# Patient Record
Sex: Female | Born: 1965
Health system: Southern US, Community
[De-identification: ages and names within clinical notes are randomized; demographics above are authoritative.]

## PROBLEM LIST (undated history)

## (undated) DIAGNOSIS — E785 Hyperlipidemia, unspecified: Secondary | ICD-10-CM

## (undated) DIAGNOSIS — Z87898 Personal history of other specified conditions: Secondary | ICD-10-CM

## (undated) DIAGNOSIS — D649 Anemia, unspecified: Secondary | ICD-10-CM

## (undated) DIAGNOSIS — Z5189 Encounter for other specified aftercare: Secondary | ICD-10-CM

## (undated) DIAGNOSIS — K219 Gastro-esophageal reflux disease without esophagitis: Secondary | ICD-10-CM

## (undated) HISTORY — DX: Encounter for other specified aftercare: Z51.89

## (undated) HISTORY — DX: Hyperlipidemia, unspecified: E78.5

## (undated) HISTORY — PX: WISDOM TOOTH EXTRACTION: SHX21

## (undated) HISTORY — DX: Personal history of other specified conditions: Z87.898

## (undated) HISTORY — DX: Gastro-esophageal reflux disease without esophagitis: K21.9

## (undated) HISTORY — PX: TONSILLECTOMY: SUR1361

---

## 2006-02-26 DIAGNOSIS — Z87898 Personal history of other specified conditions: Secondary | ICD-10-CM

## 2006-02-26 HISTORY — DX: Personal history of other specified conditions: Z87.898

## 2006-10-17 ENCOUNTER — Ambulatory Visit: Payer: Self-pay

## 2006-10-22 ENCOUNTER — Ambulatory Visit: Payer: Self-pay

## 2007-04-16 ENCOUNTER — Ambulatory Visit: Payer: Self-pay | Admitting: General Surgery

## 2007-10-06 ENCOUNTER — Ambulatory Visit: Payer: Self-pay | Admitting: General Surgery

## 2008-04-13 ENCOUNTER — Ambulatory Visit: Payer: Self-pay | Admitting: Internal Medicine

## 2008-04-26 ENCOUNTER — Ambulatory Visit: Payer: Self-pay | Admitting: Internal Medicine

## 2008-06-18 IMAGING — US ULTRASOUND LEFT BREAST
1 series · 17 of 24 positions shown · non-contrast
Comparison: none

REASON FOR EXAM: Left breast nodular density
COMMENTS:

[Series 1: ultrasound left breast · 17 of 24 slices shown]
[im 1/24]
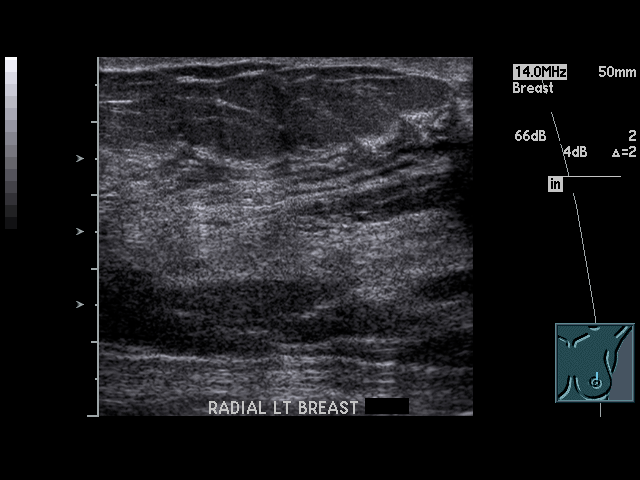
[im 3/24]
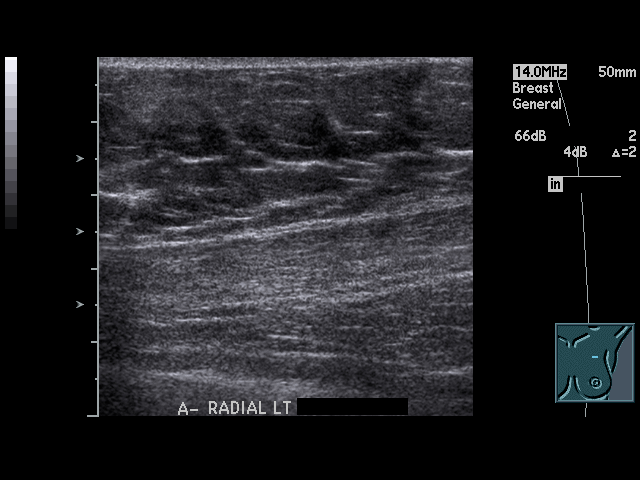
[im 4/24]
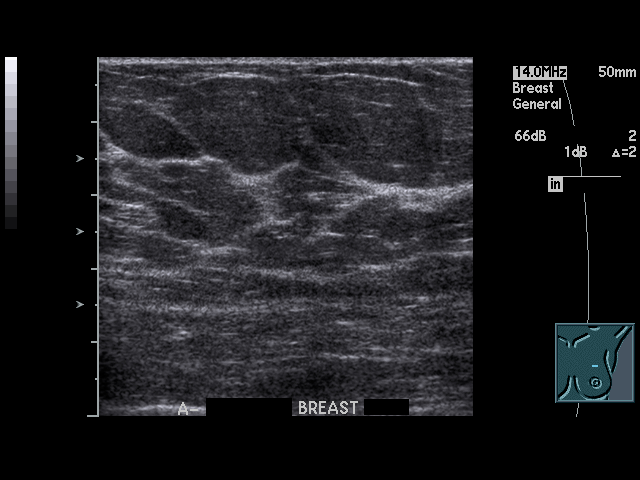
[im 5/24]
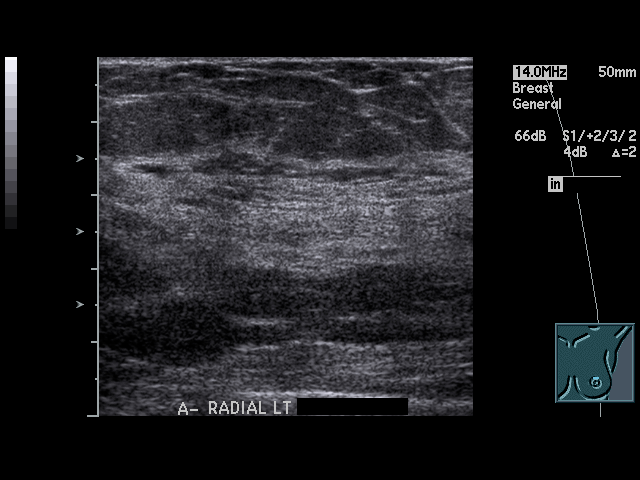
[im 7/24]
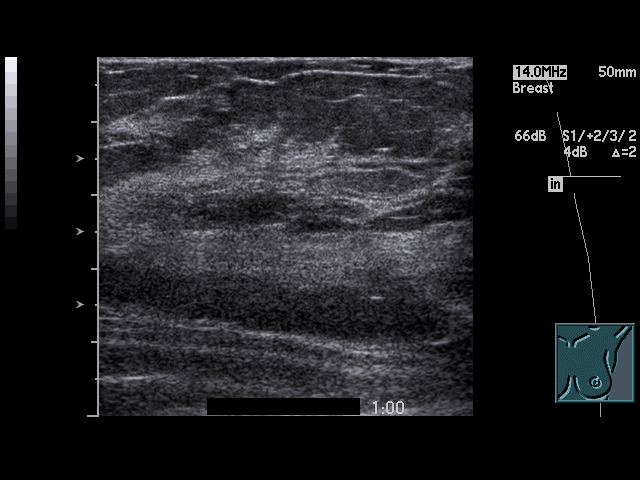
[im 8/24]
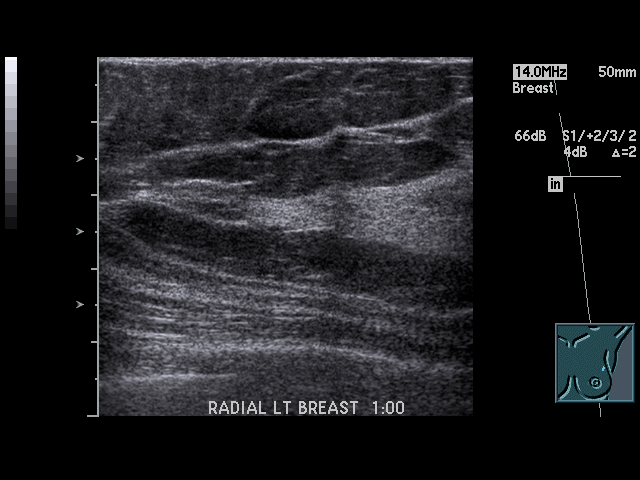
[im 10/24]
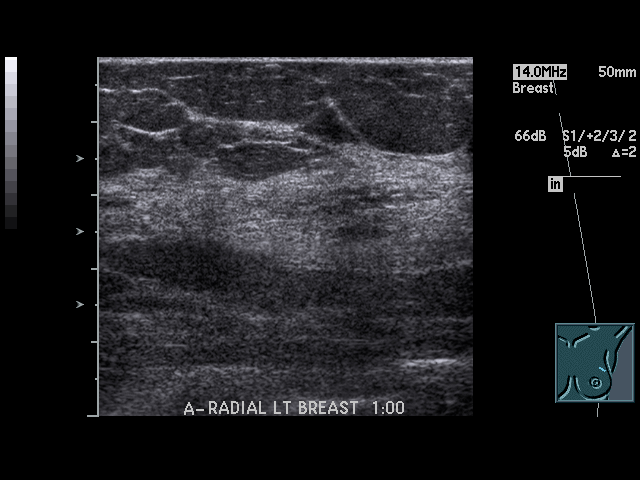
[im 11/24]
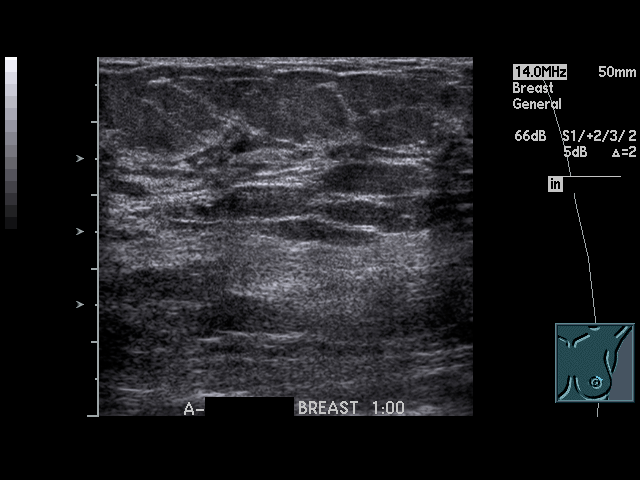
[im 13/24]
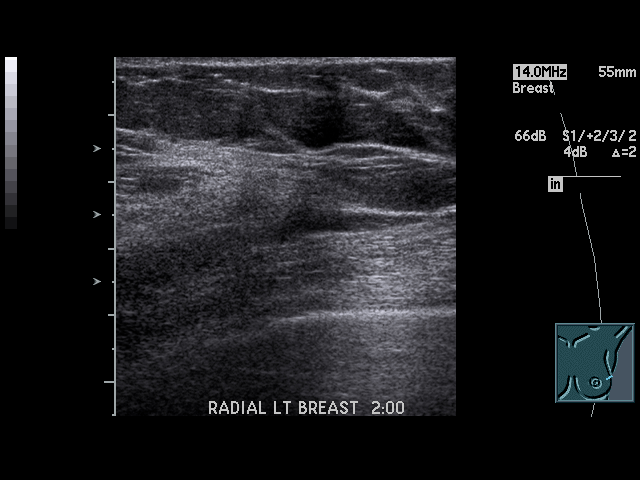
[im 14/24]
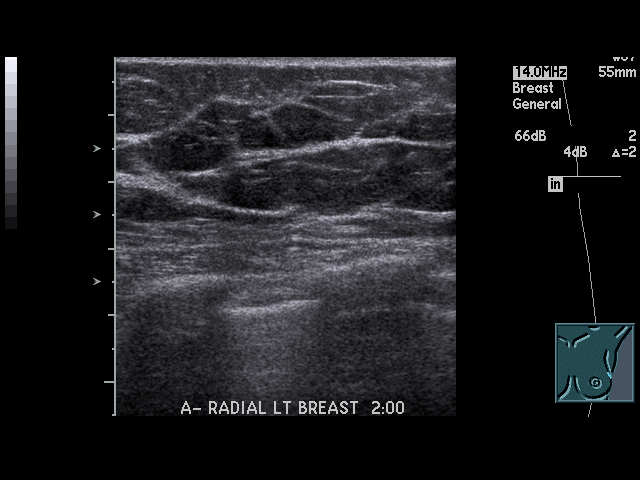
[im 15/24]
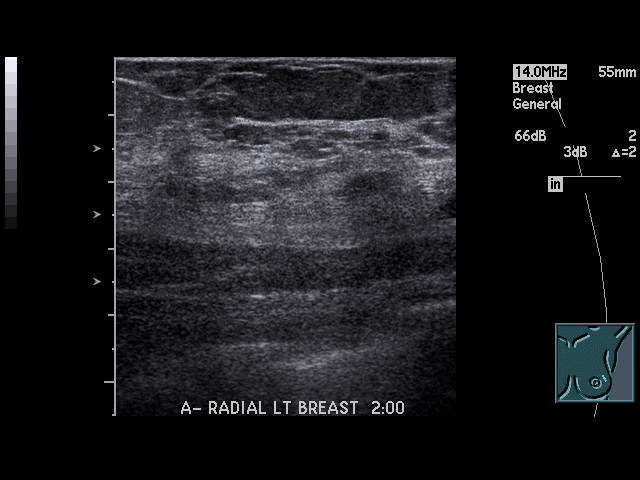
[im 17/24]
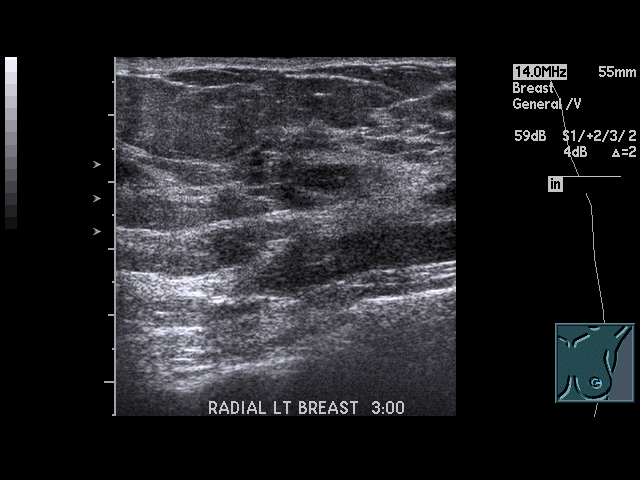
[im 18/24]
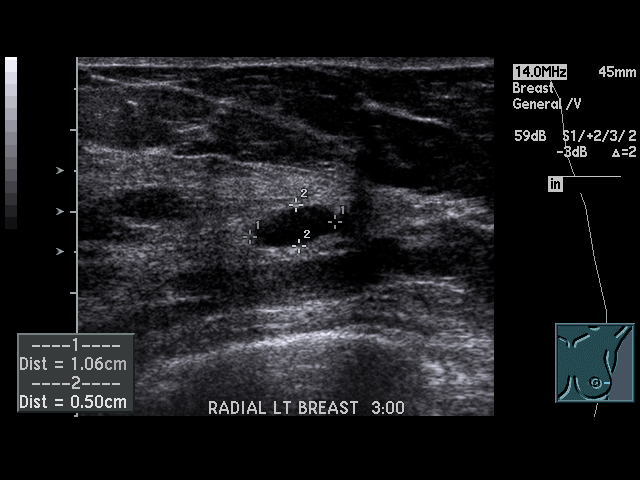
[im 20/24]
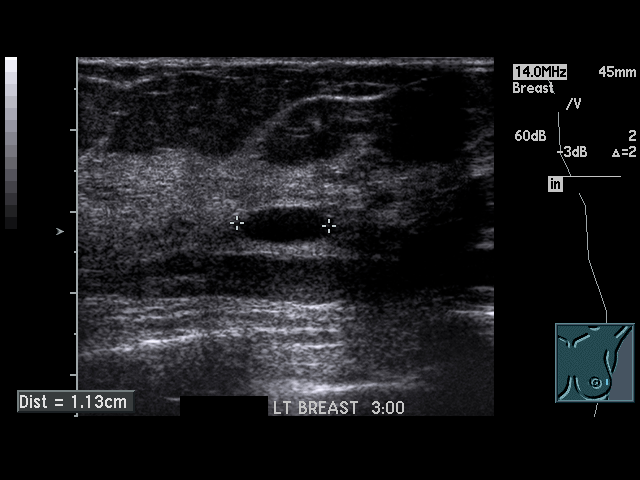
[im 21/24]
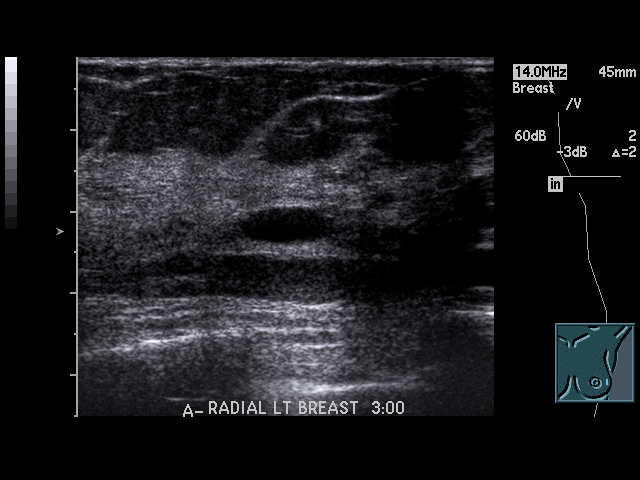
[im 22/24]
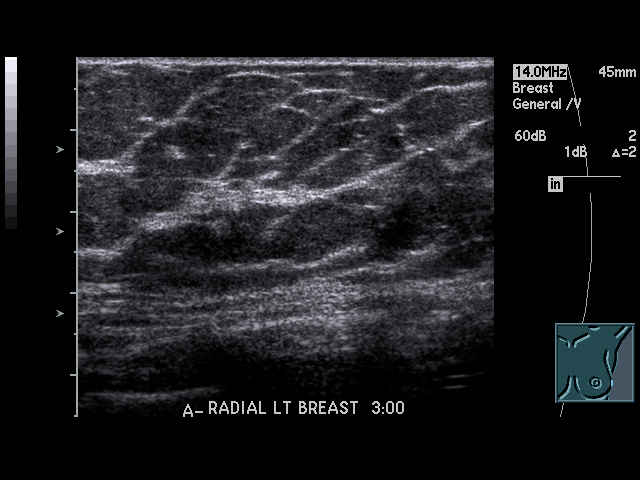
[im 24/24]
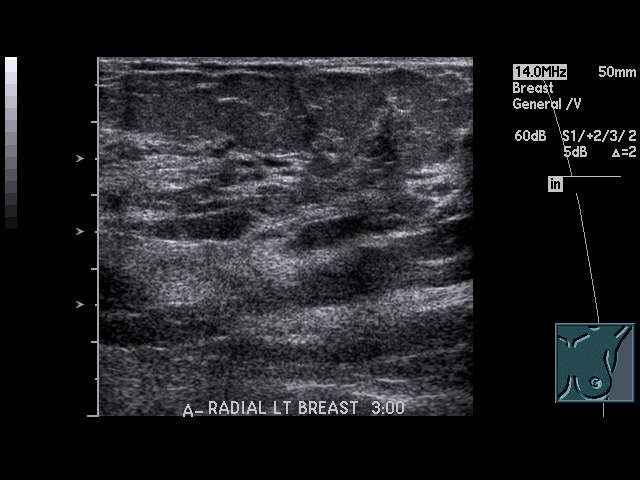

[17 of 24 positions shown; findings below may reference images not displayed]

PROCEDURE:     US  - US BREAST LEFT  - October 22, 2006  [DATE]

RESULT:     The upper outer quadrant of the LEFT breast was scanned
sonographically. The area seen in the axillary tail region of the breast in
approximately the 1 to [DATE] position was not evident sonographically. There
is a focal area of decreased echogenicity with through transmission in the 3
o'clock position which represents a probable cyst measuring 10.6 x 5.0 x
11.3 mm.
IMPRESSION: 1.     Nonvisualization of the nodular density in the upper outer quadrant
of the LEFT breast.
2.     Cyst at the approximately 3 o'clock position. Surgical consultation
is recommended for further investigation of the nodular density which is
seen only mammographically.

BI-RADS: Category 4 - Suspicious Abnormality

## 2008-11-08 ENCOUNTER — Ambulatory Visit: Payer: Self-pay

## 2009-05-11 ENCOUNTER — Ambulatory Visit: Payer: Self-pay | Admitting: Internal Medicine

## 2009-11-23 ENCOUNTER — Ambulatory Visit: Payer: Self-pay

## 2010-12-25 ENCOUNTER — Ambulatory Visit: Payer: Self-pay

## 2011-03-21 ENCOUNTER — Encounter: Payer: Self-pay | Admitting: Internal Medicine

## 2011-03-21 ENCOUNTER — Ambulatory Visit (INDEPENDENT_AMBULATORY_CARE_PROVIDER_SITE_OTHER): Payer: 59 | Admitting: Internal Medicine

## 2011-03-21 DIAGNOSIS — IMO0002 Reserved for concepts with insufficient information to code with codable children: Secondary | ICD-10-CM

## 2011-03-21 DIAGNOSIS — IMO0001 Reserved for inherently not codable concepts without codable children: Secondary | ICD-10-CM

## 2011-03-21 DIAGNOSIS — E785 Hyperlipidemia, unspecified: Secondary | ICD-10-CM

## 2011-03-21 DIAGNOSIS — E1165 Type 2 diabetes mellitus with hyperglycemia: Secondary | ICD-10-CM

## 2011-03-21 MED ORDER — METFORMIN HCL 850 MG PO TABS: 850.0000 mg | ORAL_TABLET | Freq: Two times a day (BID) | ORAL | Status: DC

## 2011-03-21 MED ORDER — GLIPIZIDE 5 MG PO TABS: 5.0000 mg | ORAL_TABLET | Freq: Two times a day (BID) | ORAL | Status: DC

## 2011-03-21 NOTE — Patient Instructions (Addendum)
EAS  Carb control [place] Vanilla  Has 2.5 carbs,  110 cal  Atkins has at least 5 or 6 flavors  Of low carb shakes   Atkins chocolate lover's variety protein bars  range from 130 to 160 cal  And   11 carbs of fiber   Glipizide 5 mg twice daily with in 20 minutes of breakfast and dinner

## 2011-03-21 NOTE — Progress Notes (Signed)
Subjective:    Patient ID: Madden Piazza, female    DOB: 1965/06/28, 46 y.o.   MRN: [id number]  HPI this Manfred[place] is a 46 year old white female with a history of hyperlipidemia and diabetes mellitus,  previously well controlled who presents today for six-month followup. She recently had an abnormal fasting lipid panel and hemoglobin A1c at the employee [place] screening at  [facility].  Her triglycerides were over 300 and hemoglobin A1c was 8.0. Her chief complaint today is uncontrolled diabetes and hypertriglyceridemia. She is taking metformin as directed and is exercising 3 times a week with both running and weight lifting. She has not followed a low carbohydrate diet but is interested in hearing in receiving information about dietary modifications. Past Medical History  Diagnosis Date  . Diabetes mellitus   . Hyperlipidemia   . GERD (gastroesophageal reflux disease)    No current outpatient prescriptions on file prior to visit.         Review of Systems  Constitutional: Negative for fever, chills and unexpected weight change.  HENT: Negative for hearing loss, ear pain, nosebleeds, congestion, sore throat, facial swelling, rhinorrhea, sneezing, mouth sores, trouble swallowing, neck pain, neck stiffness, voice change, postnasal drip, sinus pressure, tinnitus and ear discharge.   Eyes: Negative for pain, discharge, redness and visual disturbance.  Respiratory: Negative for cough, chest tightness, shortness of breath, wheezing and stridor.   Cardiovascular: Negative for chest pain, palpitations and leg swelling.  Musculoskeletal: Negative for myalgias and arthralgias.  Skin: Negative for color change and rash.  Neurological: Negative for dizziness, weakness, light-headedness and headaches.  Hematological: Negative for adenopathy.    BP 110/84  Pulse 80  Temp(Src) 98.7 F (37.1 C) (Oral)  Wt 180 lb (81.647 kg)     Objective:   Physical Exam  Constitutional: She is oriented to  person, place, and time. She appears well-developed and well-nourished.  HENT:  Mouth/Throat: Oropharynx is clear and moist.  Eyes: EOM are normal. Pupils are equal, round, and reactive to light. No scleral icterus.  Neck: Normal range of motion. Neck supple. No JVD present. No thyromegaly present.  Cardiovascular: Normal rate, regular rhythm, normal heart sounds and intact distal pulses.   Pulmonary/Chest: Effort normal and breath sounds normal.  Abdominal: Soft. Bowel sounds are normal. She exhibits no mass. There is no tenderness.  Musculoskeletal: Normal range of motion. She exhibits no edema.  Lymphadenopathy:    She has no cervical adenopathy.  Neurological: She is alert and oriented to person, place, and time.  Skin: Skin is warm and dry.  Psychiatric: She has a normal mood and affect.       Assessment & Plan:   Diabetes mellitus type 2, uncontrolled Her recent elevated hemoglobin A1c of 8.0 reflects need for additional medication. We are starting glipizide 5 mg twice daily before breakfast and dinner. We spent 15 minutes in counseling regarding dietary modifications that she can make that will both control her diabetes and help her continue to lose weight. Low carbohydrate and alternatives to some of the staple food items including protein shake for breakfast low carbohydrate breads and avoidance of all white potatoes and pasta were all suggested. She will have a repeat hemoglobin A1c in 3 months. She is up-to-date with eye exams and sees Dr. Kate Sable on an annual basis. She has no complications of neuropathy or renal insufficiency or proteinuria.  Hyperlipidemia Her triglycerides were 346 by recent screening. We decided to address this with diet and exercise and repeat in 3 months.  If her triglycerides remain above 200 that time we will consider adding fenofibrate.    Updated Medication List Outpatient Encounter Prescriptions as of 03/21/2011  Medication Sig Dispense Refill  .  metFORMIN (GLUCOPHAGE) 850 MG tablet Take 1 tablet (850 mg total) by mouth 2 (two) times daily with a meal.  90 tablet  3  . NUVARING 0.12-0.015 MG/24HR vaginal ring       . Omega-3 Fatty Acids (FISH OIL PO) Take 1500 mg's by mouth daily      . DISCONTD: metFORMIN (GLUCOPHAGE) 850 MG tablet Take 850 mg by mouth 2 (two) times daily with a meal.      . amoxicillin (AMOXIL) 875 MG tablet       . glipiZIDE (GLUCOTROL) 5 MG tablet Take 1 tablet (5 mg total) by mouth 2 (two) times daily before a meal.  60 tablet  3

## 2011-03-22 ENCOUNTER — Encounter: Payer: Self-pay | Admitting: Internal Medicine

## 2011-03-22 DIAGNOSIS — E1165 Type 2 diabetes mellitus with hyperglycemia: Secondary | ICD-10-CM | POA: Insufficient documentation

## 2011-03-22 DIAGNOSIS — E785 Hyperlipidemia, unspecified: Secondary | ICD-10-CM | POA: Insufficient documentation

## 2011-03-22 DIAGNOSIS — Z794 Long term (current) use of insulin: Secondary | ICD-10-CM | POA: Insufficient documentation

## 2011-03-22 DIAGNOSIS — R809 Proteinuria, unspecified: Secondary | ICD-10-CM | POA: Insufficient documentation

## 2011-03-22 DIAGNOSIS — E119 Type 2 diabetes mellitus without complications: Secondary | ICD-10-CM | POA: Insufficient documentation

## 2011-03-22 DIAGNOSIS — IMO0002 Reserved for concepts with insufficient information to code with codable children: Secondary | ICD-10-CM | POA: Insufficient documentation

## 2011-03-22 DIAGNOSIS — K219 Gastro-esophageal reflux disease without esophagitis: Secondary | ICD-10-CM | POA: Insufficient documentation

## 2011-03-22 NOTE — Assessment & Plan Note (Signed)
Her recent elevated hemoglobin A1c of 8.0 reflects need for additional medication. We are starting glipizide 5 mg twice daily before breakfast and dinner. We spent 15 minutes in counseling regarding dietary modifications that she can make that will both control her diabetes and help her continue to lose weight. Low carbohydrate and alternatives to some of the staple food items including protein shake for breakfast low carbohydrate breads and avoidance of all white potatoes and pasta were all suggested. She will have a repeat hemoglobin A1c in 3 months. She is up-to-date with eye exams and sees Dr. Kate Sable on an annual basis. She has no complications of neuropathy or renal insufficiency or proteinuria.

## 2011-03-22 NOTE — Assessment & Plan Note (Signed)
Her triglycerides were 346 by recent screening. We decided to address this with diet and exercise and repeat in 3 months. If her triglycerides remain above 200 that time we will consider adding fenofibrate.

## 2011-03-23 ENCOUNTER — Other Ambulatory Visit: Payer: Self-pay | Admitting: *Deleted

## 2011-03-23 MED ORDER — GLUCOSE BLOOD VI STRP: ORAL_STRIP | Status: AC

## 2011-06-22 ENCOUNTER — Other Ambulatory Visit: Payer: Self-pay | Admitting: Internal Medicine

## 2011-06-22 LAB — LIPID PANEL
Cholesterol: 171 mg/dL (ref 0–200)
HDL Cholesterol: 55 mg/dL (ref 40–60)
Ldl Cholesterol, Calc: 72 mg/dL (ref 0–100)

## 2011-06-22 LAB — HEMOGLOBIN A1C: Hemoglobin A1C: 7.2 % — ABNORMAL HIGH (ref 4.2–6.3)

## 2011-06-26 ENCOUNTER — Telehealth: Payer: Self-pay | Admitting: Internal Medicine

## 2011-06-26 NOTE — Telephone Encounter (Signed)
hgba1c is up to 7.2,  Trigs are elevated at 219.  She is due for follow up office visit.

## 2011-06-26 NOTE — Telephone Encounter (Signed)
Left message asking patient to call back

## 2011-06-28 NOTE — Telephone Encounter (Signed)
Left another message asking patient to return my call.

## 2011-07-02 NOTE — Telephone Encounter (Signed)
Left message asking patient to call back

## 2011-07-04 ENCOUNTER — Encounter: Payer: Self-pay | Admitting: Internal Medicine

## 2011-07-04 ENCOUNTER — Ambulatory Visit (INDEPENDENT_AMBULATORY_CARE_PROVIDER_SITE_OTHER): Payer: 59 | Admitting: Internal Medicine

## 2011-07-04 VITALS — BP 136/82 | HR 87 | Temp 98.6°F | Resp 16 | Wt 183.5 lb

## 2011-07-04 DIAGNOSIS — IMO0002 Reserved for concepts with insufficient information to code with codable children: Secondary | ICD-10-CM

## 2011-07-04 DIAGNOSIS — E785 Hyperlipidemia, unspecified: Secondary | ICD-10-CM

## 2011-07-04 DIAGNOSIS — E1165 Type 2 diabetes mellitus with hyperglycemia: Secondary | ICD-10-CM

## 2011-07-04 DIAGNOSIS — IMO0001 Reserved for inherently not codable concepts without codable children: Secondary | ICD-10-CM

## 2011-07-04 NOTE — Telephone Encounter (Signed)
Patient notified at her appt.

## 2011-07-04 NOTE — Progress Notes (Signed)
Patient ID: Melissa Werner, female   DOB: 03/18/1965, 46 y.o.   MRN: [id number] Patient Active Problem List  Diagnoses  . Hyperlipidemia  . GERD (gastroesophageal reflux disease)  . Diabetes mellitus type 2, uncontrolled    Subjective:  CC:   Chief Complaint  Patient presents with  . Follow-up    HPI:   Melissa Werner a 46 y.o. female who presents Follow up on diabetes mellitus type 2.  last a1c was 7.2 and trigs were elvated at 215.  Started glipizide in January  44m bid and continuing 850 bid of metformin.  She has experienced occasional lows which typically are occurring when lunch is delayed so she has dealt with that by carrying with her protein bars.  Exercising 4 days per week.      Past Medical History  Diagnosis Date  . Diabetes mellitus   . Hyperlipidemia   . GERD (gastroesophageal reflux disease)     No past surgical history on file.       The following portions of the patient's history were reviewed and updated as appropriate: Allergies, current medications, and problem list.    Review of Systems:   12 Pt  review of systems was negative except those addressed in the HPI,     History   Social History  . Marital Status: Married    Spouse Name: N/A    Number of Children: N/A  . Years of Education: N/A   Occupational History  . Not on file.   Social History Main Topics  . Smoking status: Never Smoker   . Smokeless tobacco: Never Used  . Alcohol Use: No  . Drug Use: No  . Sexually Active: Not on file   Other Topics Concern  . Not on file   Social History Narrative  . No narrative on file    Objective:  BP 136/82  Pulse 87  Temp(Src) 98.6 F (37 C) (Oral)  Resp 16  Wt 183 lb 8 oz (83.235 kg)  SpO2 98%  LMP 06/18/2011  General appearance: alert, cooperative and appears stated age Ears: normal TM's and external ear canals both ears Throat: lips, mucosa, and tongue normal; teeth and gums normal Neck: no adenopathy, no carotid bruit,  supple, symmetrical, trachea midline and thyroid not enlarged, symmetric, no tenderness/mass/nodules Back: symmetric, no curvature. ROM normal. No CVA tenderness. Lungs: clear to auscultation bilaterally Heart: regular rate and rhythm, S1, S2 normal, no murmur, click, rub or gallop Abdomen: soft, non-tender; bowel sounds normal; no masses,  no organomegaly Pulses: 2+ and symmetric Skin: Skin color, texture, turgor normal. No rashes or lesions Lymph nodes: Cervical, supraclavicular, and axillary nodes normal. Foot exam:  Nails are well trimmed,  No callouses,  Sensation intact to microfilament  Assessment and Plan:  Diabetes mellitus type 2, uncontrolled Stable control,  a1c 7.2  We discussed reducing metformin to 500 mg bid due to recurrent diarrhea interfering withher afternoon rung.  Will increase glipizide if needed ,  hgbac was 7.2 and will repeat in July.   Hyperlipidemia Last triglyceride level was 215.  We again discussed low glycemic index diet to medications.    Updated Medication List Outpatient Encounter Prescriptions as of 07/04/2011  Medication Sig Dispense Refill  . glipiZIDE (GLUCOTROL) 5 MG tablet Take 1 tablet (5 mg total) by mouth 2 (two) times daily before a meal.  60 tablet  3  . glucose blood (ONE TOUCH ULTRA TEST) test strip Use as instructed  100 each  12  .  metFORMIN (GLUCOPHAGE) 850 MG tablet Take 1 tablet (850 mg total) by mouth 2 (two) times daily with a meal.  90 tablet  3  . Multiple Vitamin (MULTIVITAMIN) tablet Take 1 tablet by mouth daily.      Marland Kitchen NUVARING 0.12-0.015 MG/24HR vaginal ring       . Omega-3 Fatty Acids (FISH OIL PO) Take 1500 mg's by mouth daily         No orders of the defined types were placed in this encounter.    No Follow-up on file.

## 2011-07-04 NOTE — Assessment & Plan Note (Addendum)
Last triglyceride level was 215.  We again discussed low glycemic index diet to medications.

## 2011-07-04 NOTE — Assessment & Plan Note (Addendum)
Stable control,  a1c 7.2  We discussed reducing metformin to 500 mg bid due to recurrent diarrhea interfering withher afternoon rung.  Will increase glipizide if needed ,  hgbac was 7.2 and will repeat in July.

## 2011-07-04 NOTE — Patient Instructions (Signed)
Consider the Low Glycemic Index Diet and 6 smaller meals daily .  This boosts your metabolism and regulates your sugars:   7 AM Low carbohydrate Protein  Shakes (EAS Carb Control  Or Atkins ,  Available everywhere,   In  cases at BJs )  2.5 carbs  (Add or substitute a toasted sandwhich thin w/ peanut butter)  10 AM: Protein bar by Atkins (snack size,  Chocolate lover's variety at  [employer]J's)    Lunch: sandwich on pita bread or flatbread (Joseph's makes a pita bread and a flat bread , available at [employer] and [employer]J's; Toufayah makes a low carb flatbread available at [employer] and [employer]) Mission makes a low carb whole wheat tortilla (carb Balance) available at [employer] most grocery stores   3 PM:  Mid day :  Another protein bar,  Or a  cheese stick, 1/4 cup of almonds, walnuts, pistachios, pecans, peanuts,  Macadamia nuts  6 PM  Dinner:  "mean and green:"  Meat/chicken/fish, salad, and green veggie : use ranch, vinagrette,  Blue cheese, etc  9 PM snack : Breyer's low carb fudgsicle or  ice cream bar (Carb Smart), or  Weight Watcher's ice cream bar , or another protein shake

## 2011-07-08 MED ORDER — METFORMIN HCL 500 MG PO TABS: ORAL_TABLET | ORAL | Status: DC

## 2011-08-09 ENCOUNTER — Other Ambulatory Visit: Payer: Self-pay | Admitting: Internal Medicine

## 2011-08-09 MED ORDER — GLIPIZIDE 5 MG PO TABS: 5.0000 mg | ORAL_TABLET | Freq: Two times a day (BID) | ORAL | Status: DC

## 2011-09-26 ENCOUNTER — Ambulatory Visit: Payer: 59 | Admitting: Internal Medicine

## 2011-10-23 ENCOUNTER — Ambulatory Visit: Payer: 59 | Admitting: Internal Medicine

## 2011-11-09 ENCOUNTER — Ambulatory Visit: Payer: Self-pay | Admitting: Internal Medicine

## 2011-11-09 LAB — LIPID PANEL
HDL Cholesterol: 49 mg/dL (ref 40–60)
Ldl Cholesterol, Calc: 68 mg/dL (ref 0–100)
VLDL Cholesterol, Calc: 54 mg/dL — ABNORMAL HIGH (ref 5–40)

## 2011-11-09 LAB — HEMOGLOBIN A1C: Hemoglobin A1C: 7.7 % — ABNORMAL HIGH (ref 4.2–6.3)

## 2011-11-13 ENCOUNTER — Encounter: Payer: Self-pay | Admitting: Internal Medicine

## 2011-11-13 ENCOUNTER — Ambulatory Visit (INDEPENDENT_AMBULATORY_CARE_PROVIDER_SITE_OTHER): Payer: PRIVATE HEALTH INSURANCE | Admitting: Internal Medicine

## 2011-11-13 VITALS — BP 126/84 | HR 76 | Temp 98.6°F | Resp 14 | Wt 182.0 lb

## 2011-11-13 DIAGNOSIS — E785 Hyperlipidemia, unspecified: Secondary | ICD-10-CM

## 2011-11-13 DIAGNOSIS — E1165 Type 2 diabetes mellitus with hyperglycemia: Secondary | ICD-10-CM

## 2011-11-13 DIAGNOSIS — IMO0001 Reserved for inherently not codable concepts without codable children: Secondary | ICD-10-CM

## 2011-11-13 DIAGNOSIS — IMO0002 Reserved for concepts with insufficient information to code with codable children: Secondary | ICD-10-CM

## 2011-11-13 MED ORDER — GLIPIZIDE 10 MG PO TABS: 10.0000 mg | ORAL_TABLET | Freq: Two times a day (BID) | ORAL | Status: DC

## 2011-11-13 MED ORDER — METFORMIN HCL 500 MG PO TABS: ORAL_TABLET | ORAL | Status: DC

## 2011-11-13 NOTE — Patient Instructions (Addendum)
Try increasing the glipizide to 10 mg twice daily or 7.5 if you have lows  This is  Dr. Norton Blizzard version of a  "Low GI"  Diet:  All of the foods can be found at grocery stores and in bulk at [employer].  The Atkins protein bars and shakes are available in more varieties at [employer], [employer] and [employer].     7 AM Breakfast:  Low carbohydrate Protein  Shakes (I recommend the EAS AdvantEdge "Carb Control" shakes  Or the low carb shakes by Atkins.   Both are available everywhere:  In  cases at BJs  Or in 4 packs at grocery stores and pharmacies  2.5 carbs  (Alternative is  a toasted Arnold's Sandwhich Thin w/ peanut butter, a "Bagel Thin" with cream cheese and salmon) or  a scrambled egg burrito made with a low carb tortilla .  Avoid cereal and bananas, oatmeal too unless you are cooking the old fashioned kind that takes 30-40 minutes to prepare.  the rest is overly processed, has minimal fiber, and is loaded with carbohydrates!   10 AM: Protein bar by Atkins (the snack size, under 200 cal).  There are many varieties , available widely again or in bulk in limited varieties at BJs)  Other so called "protein bars" tend to be loaded with carbohydrates.  Remember, in food advertising, the word "energy" is synonymous for " carbohydrate."  Lunch: sandwich of [place], (or any lunchmeat, grilled meat or canned tuna), fresh avocado, mayonnaise  and cheese on a lower carbohydrate pita bread, flatbread, or tortilla . Ok to use regular mayonnaise. The bread is the only source or carbohydrate that can be decreased (Joseph's makes a pita bread and a flat bread that are 50 cal and 4 net carbs ; Toufayan makes a low carb flatbread that's 100 cal and 9 net carbs  and  Mission makes a low carb whole wheat tortilla  That is 210 cal and 6 net carbs)  3 PM:  Mid day :  Another protein bar,  Or a  cheese stick (100 cal, 0 carbs),  Or 1 ounce of  almonds, walnuts, pistachios, pecans, peanuts,  Macadamia nuts. Or a Dannon light n  Fit greek yogurt, 80 cal 8 net carbs . Avoid "granola"; the dried cranberries and raisins are loaded with carbohydrates. Mixed nuts ok if no raisins or cranberries or dried fruit.      6 PM  Dinner:  "mean and green:"  Meat/chicken/fish or a high protein legume; , with a green salad, and a low GI  Veggie (broccoli, cauliflower, green beans, spinach, brussel sprouts. Lima beans) : Avoid "Low fat dressings, as well as Reyne Dumas and [address]! They are loaded with sugar! Instead use ranch, vinagrette,  Blue cheese, etc  9 PM snack : Breyer's "low carb" fudgsicle or  ice cream bar (Carb Smart line), or  Weight Watcher's ice cream bar , or another "no sugar added" ice cream;a serving of fresh berries/cherries with whipped cream (Avoid bananas, pineapple, grapes  and watermelon on a regular basis because they are high in sugar)   Remember that snack Substitutions should be less than 15 to 20 carbs  Per serving. Remember to subtract fiber grams and sugar alcohols to get the "net carbs."

## 2011-11-14 ENCOUNTER — Encounter: Payer: Self-pay | Admitting: Internal Medicine

## 2011-11-14 NOTE — Assessment & Plan Note (Signed)
Primarily 2 triglyceride elevation. Low glycemic index diet again recommended. Her triglycerides are not high enough to warrant use of TriCor.

## 2011-11-14 NOTE — Progress Notes (Signed)
Patient ID: Melissa Werner, female   DOB: 1965-05-31, 46 y.o.   MRN: [id number]  Patient Active Problem List  Diagnosis  . Hyperlipidemia  . GERD (gastroesophageal reflux disease)  . Diabetes mellitus type 2, uncontrolled    Subjective:  CC:   Chief Complaint  Patient presents with  . Diabetes    3 month    HPI:   Melissa Werner a 46 y.o. female who presents Followup on diabetes mellitus and hyperlipidemia. She was last seen in January. She has been exercising regularly 5 days a week. She has had some dietary indiscretions over the summer. 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 No new issues. She is frustrated by her inability to lose weight and to get her diabetes at goal. She does eat cereal for breakfast. She found that the protein shakes that were recommended for breakfast by me caused significant GI intolerance with indigestion and bloating.   Past Medical History  Diagnosis Date  . Diabetes mellitus   . Hyperlipidemia   . GERD (gastroesophageal reflux disease)     History reviewed. No pertinent past surgical history.       The following portions of the patient's history were reviewed and updated as appropriate: Allergies, current medications, and problem list.    Review of Systems:   12 Pt  review of systems was negative except those addressed in the HPI,     History   Social History  . Marital Status: Married    Spouse Name: N/A    Number of Children: N/A  . Years of Education: N/A   Occupational History  . Not on file.   Social History Main Topics  . Smoking status: Never Smoker   . Smokeless tobacco: Never Used  . Alcohol Use: No  . Drug Use: No  . Sexually Active: Not on file   Other Topics Concern  . Not on file   Social History Narrative  . No narrative on file    Objective:  BP 126/84  Pulse 76  Temp 98.6 F (37 C) (Oral)  Resp 14  Wt 182 lb (82.555 kg)  SpO2 97%  LMP 10/30/2011  General  appearance: alert, cooperative and appears stated age  Neck: no adenopathy, no carotid bruit, supple, symmetrical, trachea midline and thyroid not enlarged, symmetric, no tenderness/mass/nodules Back: symmetric, no curvature. ROM normal. No CVA tenderness. Lungs: clear to auscultation bilaterally Heart: regular rate and rhythm, S1, S2 normal, no murmur, click, rub or gallop Abdomen: soft, non-tender; bowel sounds normal; no masses,  no organomegaly Pulses: 2+ and symmetric Skin: Skin color, texture, turgor normal. No rashes or lesions Lymph nodes: Cervical, supraclavicular, and axillary nodes normal.  Assessment and Plan:  Diabetes mellitus type 2, uncontrolled Her hemoglobin A1c remains elevated at 7.7 on current doses of metformin and glipizide. We discussed increasing her glipizide to 10 mg twice daily. She can reduce this to 7.5 as she is hypoglycemic events. Low glycemic index diet again given to patient with recommendations to look more critically at her diet to see where her starches can be reduced. I will have her return in 3 months with repeat labs.  Hyperlipidemia Primarily 2 triglyceride elevation. Low glycemic index diet again recommended. Her triglycerides are not high enough to warrant use of TriCor.   Updated Medication List Outpatient Encounter Prescriptions as of 11/13/2011  Medication Sig Dispense Refill  . glipiZIDE (GLUCOTROL) 10 MG tablet Take 1 tablet (10 mg total) by mouth  2 (two) times daily before a meal.  90 tablet  3  . glucose blood (ONE TOUCH ULTRA TEST) test strip Use as instructed  100 each  12  . metFORMIN (GLUCOPHAGE) 500 MG tablet One tablet twice daily.  180 tablet  3  . Multiple Vitamin (MULTIVITAMIN) tablet Take 1 tablet by mouth daily.      Marland Kitchen NUVARING 0.12-0.015 MG/24HR vaginal ring       . Omega-3 Fatty Acids (FISH OIL PO) Take 1500 mg's by mouth daily      . DISCONTD: glipiZIDE (GLUCOTROL) 5 MG tablet Take 1 tablet (5 mg total) by mouth 2 (two)  times daily before a meal.  60 tablet  3  . DISCONTD: metFORMIN (GLUCOPHAGE) 500 MG tablet One tablet twice daily.  180 tablet  3     No orders of the defined types were placed in this encounter.    No Follow-up on file.

## 2011-11-14 NOTE — Assessment & Plan Note (Addendum)
Her hemoglobin A1c remains elevated at 7.7 on current doses of metformin and glipizide. We discussed increasing her glipizide to 10 mg twice daily. She can reduce this to 7.5 as she is hypoglycemic events. Low glycemic index diet again given to patient with recommendations to look more critically at her diet to see where her starches can be reduced. I will have her return in 3 months with repeat labs.

## 2011-12-05 ENCOUNTER — Encounter: Payer: Self-pay | Admitting: Internal Medicine

## 2012-02-07 ENCOUNTER — Ambulatory Visit: Payer: Self-pay

## 2012-02-13 ENCOUNTER — Ambulatory Visit: Payer: PRIVATE HEALTH INSURANCE | Admitting: Internal Medicine

## 2012-03-14 ENCOUNTER — Ambulatory Visit: Payer: Self-pay | Admitting: Internal Medicine

## 2012-03-14 LAB — COMPREHENSIVE METABOLIC PANEL
Albumin: 3.3 g/dL — ABNORMAL LOW (ref 3.4–5.0)
Alkaline Phosphatase: 61 U/L (ref 50–136)
Anion Gap: 8 (ref 7–16)
Calcium, Total: 8.6 mg/dL (ref 8.5–10.1)
Co2: 25 mmol/L (ref 21–32)
Creatinine: 0.84 mg/dL (ref 0.60–1.30)
EGFR (African American): >60
EGFR (Non-African Amer.): >60
Glucose: 176 mg/dL — ABNORMAL HIGH (ref 65–99)
Osmolality: 282 (ref 275–301)
Potassium: 4.6 mmol/L (ref 3.5–5.1)
SGPT (ALT): 14 U/L (ref 12–78)

## 2012-03-14 LAB — LIPID PANEL
Cholesterol: 136 mg/dL (ref 0–200)
HDL Cholesterol: 53 mg/dL (ref 40–60)
Ldl Cholesterol, Calc: 57 mg/dL (ref 0–100)
Triglycerides: 132 mg/dL (ref 0–200)

## 2012-03-14 LAB — HEMOGLOBIN A1C: Hemoglobin A1C: 8.1 % — ABNORMAL HIGH (ref 4.2–6.3)

## 2012-03-17 ENCOUNTER — Telehealth: Payer: Self-pay | Admitting: Internal Medicine

## 2012-03-17 NOTE — Telephone Encounter (Signed)
Pt has an appt with you tomorrow

## 2012-03-17 NOTE — Telephone Encounter (Signed)
Cholesterol is fine bu DM is not well controlled. Please make appt. hgba1c was 8.1

## 2012-03-18 ENCOUNTER — Encounter: Payer: Self-pay | Admitting: Internal Medicine

## 2012-03-18 ENCOUNTER — Ambulatory Visit (INDEPENDENT_AMBULATORY_CARE_PROVIDER_SITE_OTHER): Payer: 59 | Admitting: Internal Medicine

## 2012-03-18 VITALS — BP 134/84 | HR 78 | Temp 98.5°F | Resp 12 | Ht 64.0 in | Wt 185.0 lb

## 2012-03-18 DIAGNOSIS — E785 Hyperlipidemia, unspecified: Secondary | ICD-10-CM

## 2012-03-18 DIAGNOSIS — E1165 Type 2 diabetes mellitus with hyperglycemia: Secondary | ICD-10-CM

## 2012-03-18 DIAGNOSIS — IMO0002 Reserved for concepts with insufficient information to code with codable children: Secondary | ICD-10-CM

## 2012-03-18 DIAGNOSIS — IMO0001 Reserved for inherently not codable concepts without codable children: Secondary | ICD-10-CM

## 2012-03-18 NOTE — Patient Instructions (Addendum)
Try substituting eggs and breakfast meat for cereal;  Or bagel thins or sandwhich thins for toast  Try the Atkins shakes before you give up on protein shakes completely    Try Dreamfield's pasta ; it is 5 carbs/serving and tastes like real pasta   Try Wasa (norweigian ) crackers , they are lowest in  Carb.  Look for the fried peas at [facility] in the fruit section

## 2012-03-18 NOTE — Progress Notes (Signed)
Patient ID: Melissa Werner, female   DOB: 08-22-1965, 47 y.o.   MRN: [id number]   Patient Active Problem List  Diagnosis  . Hyperlipidemia  . GERD (gastroesophageal reflux disease)  . Diabetes mellitus type 2, uncontrolled    Subjective:  CC:   Chief Complaint  Patient presents with  . Diabetes    3 month F/U    HPI:   Melissa Werner a 47 y.o. female who presents Follow up on diabetes, now uncontrolled by recent labs,, hgba1c 8.0  . Last seen in Sept at which time hga1c was 7.7 Patient reports noncompliance with diet due to dissatisfaction with many of the low carb choices suggested at last visit.  She is exercising regularly without chest pain or joint pain and taking her medications. No polyuria, polydipsia or skin infections.    Past Medical History  Diagnosis Date  . Diabetes mellitus   . Hyperlipidemia   . GERD (gastroesophageal reflux disease)     History reviewed. No pertinent past surgical history.       The following portions of the patient's history were reviewed and updated as appropriate: Allergies, current medications, and problem list.    Review of Systems:  Patient denies headache, fevers, malaise, unintentional weight loss, skin rash, eye pain, sinus congestion and sinus pain, sore throat, dysphagia,  hemoptysis , cough, dyspnea, wheezing, chest pain, palpitations, orthopnea, edema, abdominal pain, nausea, melena, diarrhea, constipation, flank pain, dysuria, hematuria, urinary  Frequency, nocturia, numbness, tingling, seizures,  Focal weakness, Loss of consciousness,  Tremor, insomnia, depression, anxiety, and suicidal ideation.     History   Social History  . Marital Status: Married    Spouse Name: N/A    Number of Children: N/A  . Years of Education: N/A   Occupational History  . Not on file.   Social History Main Topics  . Smoking status: Never Smoker   . Smokeless tobacco: Never Used  . Alcohol Use: No  . Drug Use: No  . Sexually  Active: Not on file   Other Topics Concern  . Not on file   Social History Narrative  . No narrative on file    Objective:  BP 134/84  Pulse 78  Temp 98.5 F (36.9 C) (Oral)  Resp 12  Ht 5\' 4"  (1.626 m)  Wt 185 lb (83.915 kg)  BMI 31.76 kg/m2  SpO2 98%  General appearance: alert, cooperative and appears stated age Ears: normal TM's and external ear canals both ears Throat: lips, mucosa, and tongue normal; teeth and gums normal Neck: no adenopathy, no carotid bruit, supple, symmetrical, trachea midline and thyroid not enlarged, symmetric, no tenderness/mass/nodules Back: symmetric, no curvature. ROM normal. No CVA tenderness. Lungs: clear to auscultation bilaterally Heart: regular rate and rhythm, S1, S2 normal, no murmur, click, rub or gallop Abdomen: soft, non-tender; bowel sounds normal; no masses,  no organomegaly Pulses: 2+ and symmetric Skin: Skin color, texture, turgor normal. No rashes or lesions Lymph nodes: Cervical, supraclavicular, and axillary nodes normal.  Assessment and Plan:  Diabetes mellitus type 2, uncontrolled She is resistant to adding more medication and is determined to lower her a1c with more dietary discretion.  Increase  glipizide to maximal dose and metformin at current dose.   Diet reviewed in detail and suggestions made.  Return in 3 months., will add onglyza if not at goal at that time  Hyperlipidemia She has lowered her cholesterol including triglycerides with diet and exercise. No meds needed   Updated Medication List Outpatient Encounter  Prescriptions as of 03/18/2012  Medication Sig Dispense Refill  . glipiZIDE (GLUCOTROL) 10 MG tablet Take 1 tablet (10 mg total) by mouth 2 (two) times daily before a meal.  90 tablet  3  . glucose blood (ONE TOUCH ULTRA TEST) test strip Use as instructed  100 each  12  . metFORMIN (GLUCOPHAGE) 500 MG tablet One tablet twice daily.  180 tablet  3  . Multiple Vitamin (MULTIVITAMIN) tablet Take 1 tablet by  mouth daily.      Marland Kitchen NUVARING 0.12-0.015 MG/24HR vaginal ring       . Omega-3 Fatty Acids (FISH OIL PO) Take 1500 mg's by mouth daily         No orders of the defined types were placed in this encounter.    Return in about 3 months (around 06/16/2012).

## 2012-03-19 LAB — LIPID PANEL
Cholesterol: 136 mg/dL (ref 0–200)
HDL: 53 mg/dL (ref 35–70)
LDL Cholesterol: 57 mg/dL
Triglycerides: 132 mg/dL (ref 40–160)

## 2012-03-20 ENCOUNTER — Encounter: Payer: Self-pay | Admitting: Internal Medicine

## 2012-03-20 NOTE — Assessment & Plan Note (Addendum)
She is resistant to adding more medication and is determined to lower her a1c with more dietary discretion.  Increase  glipizide to maximal dose and metformin at current dose.   Diet reviewed in detail and suggestions made.  Return in 3 months., will add onglyza if not at goal at that time

## 2012-03-20 NOTE — Assessment & Plan Note (Signed)
She has lowered her cholesterol including triglycerides with diet and exercise. No meds needed

## 2012-04-13 ENCOUNTER — Other Ambulatory Visit: Payer: Self-pay

## 2012-06-11 ENCOUNTER — Telehealth: Payer: Self-pay

## 2012-06-11 NOTE — Telephone Encounter (Signed)
She needs a signed [employer],  Can pick up from front office.  Given to [employer]

## 2012-06-11 NOTE — Telephone Encounter (Signed)
Left message per DPR on mobile

## 2012-06-11 NOTE — Telephone Encounter (Signed)
Patient was left message per DPR to pick up lab paper work for [facility] at front desk.

## 2012-06-11 NOTE — Telephone Encounter (Signed)
Called patient and left voicemail asking her to give the office a call back about some labs she wanted before he next appointment with Dr. Darrick Huntsman

## 2012-06-11 NOTE — Telephone Encounter (Signed)
Patient called back she has appointment 4/29 and she states that she usually comes in a day or two before her office visit with for labs. Pt stated that you Dr. Darrick Huntsman  usually give her papers with the lab orders on them.

## 2012-06-20 ENCOUNTER — Other Ambulatory Visit: Payer: Self-pay | Admitting: Internal Medicine

## 2012-06-20 ENCOUNTER — Telehealth: Payer: Self-pay | Admitting: Internal Medicine

## 2012-06-20 DIAGNOSIS — IMO0002 Reserved for concepts with insufficient information to code with codable children: Secondary | ICD-10-CM

## 2012-06-20 DIAGNOSIS — E1165 Type 2 diabetes mellitus with hyperglycemia: Secondary | ICD-10-CM

## 2012-06-20 LAB — COMPREHENSIVE METABOLIC PANEL
BUN: 10 mg/dL (ref 7–18)
Bilirubin,Total: 0.3 mg/dL (ref 0.2–1.0)
Chloride: 104 mmol/L (ref 98–107)
Creatinine: 0.78 mg/dL (ref 0.60–1.30)
EGFR (Non-African Amer.): >60
Osmolality: 276 (ref 275–301)
SGOT(AST): 18 U/L (ref 15–37)
SGPT (ALT): 22 U/L (ref 12–78)
Total Protein: 6.9 g/dL (ref 6.4–8.2)

## 2012-06-20 LAB — HEMOGLOBIN A1C: Hemoglobin A1C: 8.7 % — ABNORMAL HIGH (ref 4.2–6.3)

## 2012-06-20 NOTE — Telephone Encounter (Signed)
Her diabetes control is worsening. Her hemoglobin A1c is now 8.7. Liver function is normal. She has an appointment next week and we will discuss additional therapy.

## 2012-06-20 NOTE — Telephone Encounter (Signed)
Patient notified as instructed detail message left on mobile voicemail per DPR.

## 2012-06-24 ENCOUNTER — Ambulatory Visit (INDEPENDENT_AMBULATORY_CARE_PROVIDER_SITE_OTHER): Payer: 59 | Admitting: Internal Medicine

## 2012-06-24 ENCOUNTER — Encounter: Payer: Self-pay | Admitting: Internal Medicine

## 2012-06-24 VITALS — BP 128/70 | HR 81 | Temp 98.4°F | Resp 16 | Wt 183.8 lb

## 2012-06-24 DIAGNOSIS — E785 Hyperlipidemia, unspecified: Secondary | ICD-10-CM

## 2012-06-24 DIAGNOSIS — E1165 Type 2 diabetes mellitus with hyperglycemia: Secondary | ICD-10-CM

## 2012-06-24 DIAGNOSIS — IMO0002 Reserved for concepts with insufficient information to code with codable children: Secondary | ICD-10-CM

## 2012-06-24 DIAGNOSIS — IMO0001 Reserved for inherently not codable concepts without codable children: Secondary | ICD-10-CM

## 2012-06-24 MED ORDER — GLUCOSE BLOOD VI STRP: ORAL_STRIP | Status: DC

## 2012-06-24 MED ORDER — METFORMIN HCL 850 MG PO TABS: ORAL_TABLET | ORAL | Status: DC

## 2012-06-24 NOTE — Patient Instructions (Addendum)
Starting tradjenta,  Suspend glipizide  For two weeks.  Add back for CBG  > 200.    Increase metformin to 850 bid  Check with insurnace about onglyza vs januvia or tradjenta   repeat labs in 3 months at my office  ]

## 2012-06-24 NOTE — Progress Notes (Signed)
Patient ID: Melissa Werner, female   DOB: 05/31/1965, 47 y.o.   MRN: [id number]  Patient Active Problem List   Diagnosis Date Noted  . Diabetes mellitus type 2, uncontrolled 03/22/2011  . Hyperlipidemia   . GERD (gastroesophageal reflux disease)     Subjective:  CC:   Chief Complaint  Patient presents with  . Follow-up    3 month    HPI:   Melissa Werner a 47 y.o. female who presents for 3 month follow up on type 2 DM and hyperlipidemia. She states that her morning fast blood sugars have been elevated ,  From 140 to 200. Post prandials have been averaging 180. She reports compliane with metformin and glipizide and no hypoglycemic events, including early morning checks.  Her recent A1c was  8.7.  She has had difficulty maintaining a low glycemic index diet, and reports that her blood sugars are actually improved after starchy meals.   Past Medical History  Diagnosis Date  . Diabetes mellitus   . Hyperlipidemia   . GERD (gastroesophageal reflux disease)     No past surgical history on file.     The following portions of the patient's history were reviewed and updated as appropriate: Allergies, current medications, and problem list.    Review of Systems:  Patient denies headache, fevers, malaise, unintentional weight loss, skin rash, eye pain, sinus congestion and sinus pain, sore throat, dysphagia,  hemoptysis , cough, dyspnea, wheezing, chest pain, palpitations, orthopnea, edema, abdominal pain, nausea, melena, diarrhea, constipation, flank pain, dysuria, hematuria, urinary  Frequency, nocturia, numbness, tingling, seizures,  Focal weakness, Loss of consciousness,  Tremor, insomnia, depression, anxiety, and suicidal ideation.     History   Social History  . Marital Status: Married    Spouse Name: N/A    Number of Children: N/A  . Years of Education: N/A   Occupational History  . Not on file.   Social History Main Topics  . Smoking status: Never Smoker   .  Smokeless tobacco: Never Used  . Alcohol Use: No  . Drug Use: No  . Sexually Active: Not on file   Other Topics Concern  . Not on file   Social History Narrative  . No narrative on file    Objective:  BP 128/70  Pulse 81  Temp(Src) 98.4 F (36.9 C) (Oral)  Resp 16  Wt 183 lb 12 oz (83.348 kg)  BMI 31.52 kg/m2  SpO2 100%  LMP 06/09/2012  General appearance: alert, cooperative and appears stated age Ears: normal TM's and external ear canals both ears Throat: lips, mucosa, and tongue normal; teeth and gums normal Neck: no adenopathy, no carotid bruit, supple, symmetrical, trachea midline and thyroid not enlarged, symmetric, no tenderness/mass/nodules Back: symmetric, no curvature. ROM normal. No CVA tenderness. Lungs: clear to auscultation bilaterally Heart: regular rate and rhythm, S1, S2 normal, no murmur, click, rub or gallop Abdomen: soft, non-tender; bowel sounds normal; no masses,  no organomegaly Pulses: 2+ and symmetric Skin: Skin color, texture, turgor normal. No rashes or lesions Lymph nodes: Cervical, supraclavicular, and axillary nodes normal.  Assessment and Plan:  Diabetes mellitus type 2, uncontrolled Continued deterioration of control.  Adding tradjenta 5 mg daily and increasing metformin to 850 mg bid.    Hyperlipidemia She has lowered her cholesterol including triglycerides with diet and exercise. No meds needed     Updated Medication List Outpatient Encounter Prescriptions as of 06/24/2012  Medication Sig Dispense Refill  . glipiZIDE (GLUCOTROL) 10 MG tablet Take 1  tablet (10 mg total) by mouth 2 (two) times daily before a meal.  90 tablet  3  . metFORMIN (GLUCOPHAGE) 850 MG tablet One tablet twice daily.  180 tablet  3  . Multiple Vitamin (MULTIVITAMIN) tablet Take 1 tablet by mouth daily.      Marland Kitchen NUVARING 0.12-0.015 MG/24HR vaginal ring       . Omega-3 Fatty Acids (FISH OIL PO) Take 1500 mg's by mouth daily      . [DISCONTINUED] metFORMIN  (GLUCOPHAGE) 500 MG tablet One tablet twice daily.  180 tablet  3  . glucose blood test strip Use three times daily to check cbg  100 each  12   No facility-administered encounter medications on file as of 06/24/2012.

## 2012-06-25 NOTE — Assessment & Plan Note (Signed)
Continued deterioration of control.  Adding tradjenta 5 mg daily and increasing metformin to 850 mg bid.

## 2012-06-25 NOTE — Assessment & Plan Note (Signed)
She has lowered her cholesterol including triglycerides with diet and exercise. No meds needed

## 2012-06-26 ENCOUNTER — Telehealth: Payer: Self-pay | Admitting: Internal Medicine

## 2012-06-26 DIAGNOSIS — Z79899 Other long term (current) drug therapy: Secondary | ICD-10-CM

## 2012-06-26 MED ORDER — LISINOPRIL 5 MG PO TABS: 5.0000 mg | ORAL_TABLET | Freq: Every day | ORAL | Status: DC

## 2012-06-26 NOTE — Telephone Encounter (Signed)
Her urine test for  Microscopic protein was slightly abnormal/positive, which means that the diabetes is starting to affect her kidney function .  I would like her to start a low dose of lisinopril for protection of her kidneys and will send rx to her pharmacy.  She will need to have a BMET 1 week after starting medication to monitor for any changes in renal function or potassium level. She can have that done here,  She does NOT need to fast for the blood test.

## 2012-06-26 NOTE — Telephone Encounter (Signed)
Informed patient of her lab results and faxed prescription to the pharmacy. She verbalized understanding and lab appointment has been scheduled.

## 2012-06-26 NOTE — Telephone Encounter (Signed)
Left message for patient to call office.  

## 2012-07-07 ENCOUNTER — Other Ambulatory Visit (INDEPENDENT_AMBULATORY_CARE_PROVIDER_SITE_OTHER): Payer: 59

## 2012-07-07 DIAGNOSIS — Z79899 Other long term (current) drug therapy: Secondary | ICD-10-CM

## 2012-07-07 LAB — BASIC METABOLIC PANEL: Creatinine: 0.8 mg/dL (ref <=1.1)

## 2012-07-08 ENCOUNTER — Encounter: Payer: Self-pay | Admitting: Internal Medicine

## 2012-07-08 LAB — BASIC METABOLIC PANEL
BUN: 12 mg/dL (ref 6–23)
CO2: 24 mEq/L (ref 19–32)
Calcium: 9.4 mg/dL (ref 8.4–10.5)
Creatinine, Ser: 0.7 mg/dL (ref 0.4–1.2)
Glucose, Bld: 174 mg/dL — ABNORMAL HIGH (ref 70–99)

## 2012-07-11 ENCOUNTER — Telehealth: Payer: Self-pay | Admitting: *Deleted

## 2012-07-11 NOTE — Telephone Encounter (Signed)
Unread myChart message: "Your repeat labs indicate normal potassium level and no change in kidney function. Your nonfasting serum glucose was high at 170. Are you having any trouble with the new medications?" Left message for pt, advising results. Requested pt to call back to discuss medications and any problems.

## 2012-07-24 ENCOUNTER — Encounter: Payer: Self-pay | Admitting: Internal Medicine

## 2012-07-24 DIAGNOSIS — IMO0001 Reserved for inherently not codable concepts without codable children: Secondary | ICD-10-CM

## 2012-07-24 NOTE — Telephone Encounter (Signed)
Viewed my chart

## 2012-09-03 ENCOUNTER — Encounter: Payer: Self-pay | Admitting: Internal Medicine

## 2012-09-03 DIAGNOSIS — E1165 Type 2 diabetes mellitus with hyperglycemia: Secondary | ICD-10-CM

## 2012-09-03 DIAGNOSIS — IMO0002 Reserved for concepts with insufficient information to code with codable children: Secondary | ICD-10-CM

## 2012-09-04 MED ORDER — GLIPIZIDE 10 MG PO TABS: 10.0000 mg | ORAL_TABLET | Freq: Two times a day (BID) | ORAL | Status: DC

## 2012-09-04 MED ORDER — METFORMIN HCL 850 MG PO TABS: ORAL_TABLET | ORAL | Status: DC

## 2012-09-08 ENCOUNTER — Encounter: Payer: Self-pay | Admitting: Internal Medicine

## 2012-09-23 ENCOUNTER — Ambulatory Visit: Payer: 59 | Admitting: Internal Medicine

## 2012-10-08 ENCOUNTER — Encounter: Payer: Self-pay | Admitting: Internal Medicine

## 2012-10-15 ENCOUNTER — Encounter: Payer: Self-pay | Admitting: Internal Medicine

## 2012-10-15 ENCOUNTER — Ambulatory Visit (INDEPENDENT_AMBULATORY_CARE_PROVIDER_SITE_OTHER): Payer: 59 | Admitting: Internal Medicine

## 2012-10-15 VITALS — BP 128/84 | HR 87 | Temp 98.2°F | Resp 14 | Wt 179.5 lb

## 2012-10-15 DIAGNOSIS — E119 Type 2 diabetes mellitus without complications: Secondary | ICD-10-CM

## 2012-10-15 DIAGNOSIS — E785 Hyperlipidemia, unspecified: Secondary | ICD-10-CM

## 2012-10-15 DIAGNOSIS — E669 Obesity, unspecified: Secondary | ICD-10-CM

## 2012-10-15 MED ORDER — GLUCOSE BLOOD VI STRP: ORAL_STRIP | Status: DC

## 2012-10-15 MED ORDER — LISINOPRIL 5 MG PO TABS: 5.0000 mg | ORAL_TABLET | Freq: Every day | ORAL | Status: DC

## 2012-10-15 NOTE — Progress Notes (Signed)
Patient ID: Melissa Werner, female   DOB: 01-16-1966, 47 y.o.   MRN: [id number]   Patient Active Problem List   Diagnosis Date Noted  . Obesity, unspecified 10/17/2012  . DM type 2, not at goal 03/22/2011  . Hyperlipidemia   . GERD (gastroesophageal reflux disease)     Subjective:  CC:   Chief Complaint  Patient presents with  . Follow-up  . Diabetes    HPI:   Melissa Werner a 47 y.o. female who presents for 3 month follow up on diabetes mellitus, uncontrolled,  HTN, and obesity.  After her last visit she was referred to Dr Tedd Sias who has prescribed  janumet 50/1000 mg  Bid .  She has only been taking it for a week and has not had any hypoglycemic events.  Her fasting sugars have improved since last visit but are not at goal yet,  All are   > 150.    HTN: tolerating  lisinopril without cough  Obesity: she has lost 4 lbs since last visit.,  She is walking for exercising several days per week.  Following a low glycemic index diet     Past Medical History  Diagnosis Date  . Diabetes mellitus   . Hyperlipidemia   . GERD (gastroesophageal reflux disease)     History reviewed. No pertinent past surgical history.     The following portions of the patient's history were reviewed and updated as appropriate: Allergies, current medications, and problem list.    Review of Systems:   12 Pt  review of systems was negative except those addressed in the HPI,     History   Social History  . Marital Status: Married    Spouse Name: N/A    Number of Children: N/A  . Years of Education: N/A   Occupational History  . Not on file.   Social History Main Topics  . Smoking status: Never Smoker   . Smokeless tobacco: Never Used  . Alcohol Use: No  . Drug Use: No  . Sexual Activity: Not on file   Other Topics Concern  . Not on file   Social History Narrative  . No narrative on file    Objective:  Filed Vitals:   10/15/12 1553  BP: 128/84  Pulse: 87  Temp: 98.2 F  (36.8 C)  Resp: 14     General appearance: alert, cooperative and appears stated age Ears: normal TM's and external ear canals both ears Throat: lips, mucosa, and tongue normal; teeth and gums normal Neck: no adenopathy, no carotid bruit, supple, symmetrical, trachea midline and thyroid not enlarged, symmetric, no tenderness/mass/nodules Back: symmetric, no curvature. ROM normal. No CVA tenderness. Lungs: clear to auscultation bilaterally Heart: regular rate and rhythm, S1, S2 normal, no murmur, click, rub or gallop Abdomen: soft, non-tender; bowel sounds normal; no masses,  no organomegaly Pulses: 2+ and symmetric Skin: Skin color, texture, turgor normal. No rashes or lesions Lymph nodes: Cervical, supraclavicular, and axillary nodes normal.  Assessment and Plan:  Obesity, unspecified I have addressed  BMI and recommended a low glycemic index diet utilizing smaller more frequent meals to increase metabolism.  I have also recommended that patient start exercising with a goal of 30 minutes of aerobic exercise a minimum of 5 days per week.    Hyperlipidemia LDL is > 70 on diet alone   DM type 2, not at goal Now managed by Dr Tedd Sias for A1c > 8.0 (8.02 Jul 2012).  With recent addition of Janumet 50/1000  last week. She has microalbuminuria and is tolerating lisinopril.  LDL was 57 in January so no statin needed.  Encouraged weight loss and exercise to help improve insulin resistance    Updated Medication List Outpatient Encounter Prescriptions as of 10/15/2012  Medication Sig Dispense Refill  . glipiZIDE (GLUCOTROL) 10 MG tablet Take 1 tablet (10 mg total) by mouth 2 (two) times daily before a meal.  180 tablet  1  . glucose blood test strip Use three times daily to check cbg  Uncontrolled DM type 2  100 each  12  . lisinopril (PRINIVIL,ZESTRIL) 5 MG tablet Take 1 tablet (5 mg total) by mouth daily.  90 tablet  3  . NUVARING 0.12-0.015 MG/24HR vaginal ring       .  sitaGLIPtan-metformin (JANUMET) 50-1000 MG per tablet Take 1 tablet by mouth 2 (two) times daily with a meal.      . [DISCONTINUED] glucose blood test strip Use three times daily to check cbg  100 each  12  . [DISCONTINUED] lisinopril (PRINIVIL,ZESTRIL) 5 MG tablet Take 1 tablet (5 mg total) by mouth daily.  90 tablet  3  . [DISCONTINUED] metFORMIN (GLUCOPHAGE) 850 MG tablet One tablet twice daily.  180 tablet  1  . [DISCONTINUED] Multiple Vitamin (MULTIVITAMIN) tablet Take 1 tablet by mouth daily.      . [DISCONTINUED] Omega-3 Fatty Acids (FISH OIL PO) Take 1500 mg's by mouth daily       No facility-administered encounter medications on file as of 10/15/2012.     Orders Placed This Encounter  Procedures  . Hemoglobin A1c  . Basic metabolic panel  . Lipid panel  . HM DIABETES EYE EXAM  . HM DIABETES FOOT EXAM    No Follow-up on file.

## 2012-10-17 ENCOUNTER — Encounter: Payer: Self-pay | Admitting: Internal Medicine

## 2012-10-17 DIAGNOSIS — E663 Overweight: Secondary | ICD-10-CM | POA: Insufficient documentation

## 2012-10-17 NOTE — Assessment & Plan Note (Signed)
Now managed by Dr Tedd Sias for A1c > 8.0 (8.02 Jul 2012).  With recent addition of Janumet 50/1000 last week. She has microalbuminuria and is tolerating lisinopril.  LDL was 57 in January so no statin needed.  Encouraged weight loss and exercise to help improve insulin resistance

## 2012-10-17 NOTE — Assessment & Plan Note (Signed)
LDL is > 70 on diet alone

## 2012-10-17 NOTE — Assessment & Plan Note (Signed)
I have addressed  BMI and recommended a low glycemic index diet utilizing smaller more frequent meals to increase metabolism.  I have also recommended that patient start exercising with a goal of 30 minutes of aerobic exercise a minimum of 5 days per week.  

## 2012-10-30 ENCOUNTER — Encounter: Payer: Self-pay | Admitting: Internal Medicine

## 2012-10-30 MED ORDER — LISINOPRIL 5 MG PO TABS: 5.0000 mg | ORAL_TABLET | Freq: Every day | ORAL | Status: DC

## 2012-12-29 ENCOUNTER — Encounter: Payer: Self-pay | Admitting: Internal Medicine

## 2012-12-29 MED ORDER — GLIPIZIDE 10 MG PO TABS: 10.0000 mg | ORAL_TABLET | Freq: Two times a day (BID) | ORAL | Status: DC

## 2013-01-01 ENCOUNTER — Other Ambulatory Visit: Payer: Self-pay

## 2013-01-04 LAB — HM PAP SMEAR: HM Pap smear: NORMAL

## 2013-02-03 LAB — HM MAMMOGRAPHY: HM Mammogram: NORMAL

## 2013-02-10 ENCOUNTER — Ambulatory Visit: Payer: Self-pay | Admitting: Obstetrics and Gynecology

## 2013-03-24 ENCOUNTER — Other Ambulatory Visit: Payer: Self-pay | Admitting: Internal Medicine

## 2013-04-06 ENCOUNTER — Ambulatory Visit (INDEPENDENT_AMBULATORY_CARE_PROVIDER_SITE_OTHER): Payer: 59 | Admitting: Internal Medicine

## 2013-04-06 ENCOUNTER — Encounter: Payer: Self-pay | Admitting: Internal Medicine

## 2013-04-06 VITALS — BP 118/76 | HR 80 | Temp 98.3°F | Resp 16 | Wt 169.5 lb

## 2013-04-06 DIAGNOSIS — E669 Obesity, unspecified: Secondary | ICD-10-CM

## 2013-04-06 DIAGNOSIS — E785 Hyperlipidemia, unspecified: Secondary | ICD-10-CM

## 2013-04-06 DIAGNOSIS — E119 Type 2 diabetes mellitus without complications: Secondary | ICD-10-CM

## 2013-04-06 LAB — HM DIABETES FOOT EXAM: HM DIABETIC FOOT EXAM: NORMAL

## 2013-04-06 MED ORDER — PROMETHAZINE HCL 25 MG RE SUPP: 25.0000 mg | Freq: Four times a day (QID) | RECTAL | Status: DC | PRN

## 2013-04-06 MED ORDER — LISINOPRIL 5 MG PO TABS
5.0000 mg | ORAL_TABLET | Freq: Every day | ORAL | Status: DC
Start: 2013-04-06 — End: 2013-06-16

## 2013-04-06 MED ORDER — OSELTAMIVIR PHOSPHATE 75 MG PO CAPS: 75.0000 mg | ORAL_CAPSULE | Freq: Two times a day (BID) | ORAL | Status: DC

## 2013-04-06 NOTE — Progress Notes (Signed)
Pre-visit discussion using our clinic review tool. No additional management support is needed unless otherwise documented below in the visit note.  

## 2013-04-06 NOTE — Assessment & Plan Note (Addendum)
Has lost 10 lbs on new diabetes medicaiton. Exercising and following low GI diet . I have congratulated her in reduction of   BMI and encouraged  Continued weight loss with goal of 10% of body weigh over the next 6 months using a low glycemic index diet and regular exercise a minimum of 5 days per week.

## 2013-04-06 NOTE — Patient Instructions (Addendum)
Return for fastings  labs and urine test  Use the phenergan suppositoryif you get the GI bug    Start  the tamiflu   Flu symptoms:   horrible aching joint and bone pain over the entire body , fevers (100.4 or higher) cough (may also get the  GI symptoms )

## 2013-04-07 NOTE — Assessment & Plan Note (Signed)
LDL has been <  70 on diet historically; repeat lipids are due

## 2013-04-07 NOTE — Assessment & Plan Note (Addendum)
improving per patient with new drug therapy started by Endocrine. She  is up-to-date on eye exams and her foot exam is norma. l we'll repeat his urine microalbumin to creatinine ratio and fasting lipids.  She is on the appropriate medications.

## 2013-04-07 NOTE — Progress Notes (Signed)
Patient ID: Melissa Werner Gotsch, female   DOB: Sep 22, 1965, 48 y.o.   MRN: [id number]  Patient Active Problem List   Diagnosis Date Noted  . Overweight (BMI 25.0-29.9) 10/17/2012  . DM type 2, not at goal 03/22/2011  . Hyperlipidemia   . GERD (gastroesophageal reflux disease)     Subjective:  CC:   Chief Complaint  Patient presents with  . Follow-up    diabetes    HPI:   Melissa Werner Rutledge is a 48 y.o. female who presents for Follow up on , hyperlipidemia, DM and obesity.  She Has lost 10 lbs since starting on new medication bu Dr [name] for management of uncontrolled DM.  Feels good,  No side effects noted,  No  Hypoglycemic events noted and BX are now < 200 consistently. Exercising 5 days/week    Past Medical History  Diagnosis Date  . Diabetes mellitus   . Hyperlipidemia   . GERD (gastroesophageal reflux disease)     No past surgical history on file.     The following portions of the patient's history were reviewed and updated as appropriate: Allergies, current medications, and problem list.    Review of Systems:   Patient denies headache, fevers, malaise, unintentional weight loss, skin rash, eye pain, sinus congestion and sinus pain, sore throat, dysphagia,  hemoptysis , cough, dyspnea, wheezing, chest pain, palpitations, orthopnea, edema, abdominal pain, nausea, melena, diarrhea, constipation, flank pain, dysuria, hematuria, urinary  Frequency, nocturia, numbness, tingling, seizures,  Focal weakness, Loss of consciousness,  Tremor, insomnia, depression, anxiety, and suicidal ideation.     History   Social History  . Marital Status: Married    Spouse Name: N/A    Number of Children: N/A  . Years of Education: N/A   Occupational History  . Not on file.   Social History Main Topics  . Smoking status: Never Smoker   . Smokeless tobacco: Never Used  . Alcohol Use: No  . Drug Use: No  . Sexual Activity: Not on file   Other Topics Concern  . Not on file   Social  History Narrative  . No narrative on file    Objective:  Filed Vitals:   04/06/13 1644  BP: 118/76  Pulse: 80  Temp: 98.3 F (36.8 C)  Resp: 16     General appearance: alert, cooperative and appears stated age Ears: normal TM's and external ear canals both ears Throat: lips, mucosa, and tongue normal; teeth and gums normal Neck: no adenopathy, no carotid bruit, supple, symmetrical, trachea midline and thyroid not enlarged, symmetric, no tenderness/mass/nodules Back: symmetric, no curvature. ROM normal. No CVA tenderness. Lungs: clear to auscultation bilaterally Heart: regular rate and rhythm, S1, S2 normal, no murmur, click, rub or gallop Abdomen: soft, non-tender; bowel sounds normal; no masses,  no organomegaly Pulses: 2+ and symmetric Skin: Skin color, texture, turgor normal. No rashes or lesions Lymph nodes: Cervical, supraclavicular, and axillary nodes normal.  Assessment and Plan:  Overweight (BMI 25.0-29.9) Has lost 10 lbs on new diabetes medicaiton. Exercising and following low GI diet . I have congratulated her in reduction of   BMI and encouraged  Continued weight loss with goal of 10% of body weigh over the next 6 months using a low glycemic index diet and regular exercise a minimum of 5 days per week.    DM type 2, not at goal improving per patient with new drug therapy started by Endocrine. She  is up-to-date on eye exams and her foot exam is norma.  l we'll repeat his urine microalbumin to creatinine ratio and fasting lipids.  She is on the appropriate medications.  Hyperlipidemia LDL has been <  70 on diet historically; repeat lipids are due   Updated Medication List Outpatient Encounter Prescriptions as of 04/06/2013  Medication Sig  . Dapagliflozin-Metformin HCl ER (XIGDUO XR) 06-998 MG TB24 Take 1 capsule by mouth 2 (two) times daily.  Marland Kitchen glipiZIDE (GLUCOTROL) 10 MG tablet Take 1 tablet (10 mg total) by mouth 2 (two) times daily before a meal.  . glucose  blood test strip Use three times daily to check cbg  Uncontrolled DM type 2  . lisinopril (PRINIVIL,ZESTRIL) 5 MG tablet Take 1 tablet (5 mg total) by mouth daily.  Marland Kitchen NUVARING 0.12-0.015 MG/24HR vaginal ring   . [DISCONTINUED] lisinopril (PRINIVIL,ZESTRIL) 5 MG tablet Take 1 tablet (5 mg total) by mouth daily.  Marland Kitchen oseltamivir (TAMIFLU) 75 MG capsule Take 1 capsule (75 mg total) by mouth 2 (two) times daily.  . promethazine (PHENERGAN) 25 MG suppository Place 1 suppository (25 mg total) rectally every 6 (six) hours as needed for nausea or vomiting.  . [DISCONTINUED] lisinopril (PRINIVIL,ZESTRIL) 5 MG tablet TAKE 1 TABLET (5 MG TOTAL) BY MOUTH DAILY.  . [DISCONTINUED] sitaGLIPtan-metformin (JANUMET) 50-1000 MG per tablet Take 1 tablet by mouth 2 (two) times daily with a meal.     Orders Placed This Encounter  Procedures  . HM MAMMOGRAPHY  . HM PAP SMEAR  . Microalbumin / creatinine urine ratio  . Lipid panel  . Comprehensive metabolic panel  . HM DIABETES FOOT EXAM    No Follow-up on file.

## 2013-04-10 ENCOUNTER — Telehealth: Payer: Self-pay

## 2013-04-10 NOTE — Telephone Encounter (Signed)
Relevant patient education assigned to patient using Emmi. ° °

## 2013-04-14 ENCOUNTER — Other Ambulatory Visit: Payer: 59

## 2013-04-27 ENCOUNTER — Other Ambulatory Visit (INDEPENDENT_AMBULATORY_CARE_PROVIDER_SITE_OTHER): Payer: 59

## 2013-04-27 DIAGNOSIS — E119 Type 2 diabetes mellitus without complications: Secondary | ICD-10-CM

## 2013-04-27 DIAGNOSIS — E785 Hyperlipidemia, unspecified: Secondary | ICD-10-CM

## 2013-04-27 LAB — COMPREHENSIVE METABOLIC PANEL
ALK PHOS: 45 U/L (ref 39–117)
ALT: 12 U/L (ref 0–35)
AST: 13 U/L (ref 0–37)
Albumin: 3.5 g/dL (ref 3.5–5.2)
BILIRUBIN TOTAL: 0.3 mg/dL (ref 0.3–1.2)
BUN: 12 mg/dL (ref 6–23)
CALCIUM: 8.8 mg/dL (ref 8.4–10.5)
CO2: 24 mEq/L (ref 19–32)
CREATININE: 0.7 mg/dL (ref 0.4–1.2)
Chloride: 106 mEq/L (ref 96–112)
GFR: 92.23 mL/min (ref >60.00)
GLUCOSE: 124 mg/dL — AB (ref 70–99)
Potassium: 4.3 mEq/L (ref 3.5–5.1)
Sodium: 137 mEq/L (ref 135–145)
TOTAL PROTEIN: 6.7 g/dL (ref 6.0–8.3)

## 2013-04-27 LAB — LIPID PANEL
CHOLESTEROL: 169 mg/dL (ref 0–200)
HDL: 53.8 mg/dL (ref >39.00)
LDL Cholesterol: 84 mg/dL (ref 0–99)
TRIGLYCERIDES: 155 mg/dL — AB (ref 0.0–149.0)
Total CHOL/HDL Ratio: 3
VLDL: 31 mg/dL (ref 0.0–40.0)

## 2013-04-27 LAB — MICROALBUMIN / CREATININE URINE RATIO
Creatinine,U: 99.3 mg/dL
MICROALB/CREAT RATIO: 0.4 mg/g (ref 0.0–30.0)
Microalb, Ur: 0.4 mg/dL (ref 0.0–1.9)

## 2013-04-29 ENCOUNTER — Encounter: Payer: Self-pay | Admitting: Internal Medicine

## 2013-06-16 ENCOUNTER — Other Ambulatory Visit: Payer: Self-pay | Admitting: Internal Medicine

## 2013-08-31 LAB — HEMOGLOBIN A1C: HEMOGLOBIN A1C: 6.9 % — AB (ref 4.0–6.0)

## 2013-08-31 LAB — HM DIABETES EYE EXAM

## 2013-08-31 LAB — HM DIABETES FOOT EXAM: HM Diabetic Foot Exam: NORMAL

## 2013-10-05 ENCOUNTER — Ambulatory Visit: Payer: 59 | Admitting: Internal Medicine

## 2013-10-06 ENCOUNTER — Encounter: Payer: Self-pay | Admitting: Internal Medicine

## 2013-10-06 ENCOUNTER — Other Ambulatory Visit: Payer: Self-pay | Admitting: Internal Medicine

## 2013-10-06 ENCOUNTER — Ambulatory Visit (INDEPENDENT_AMBULATORY_CARE_PROVIDER_SITE_OTHER): Payer: 59 | Admitting: Internal Medicine

## 2013-10-06 VITALS — BP 106/66 | HR 81 | Temp 98.6°F | Resp 18 | Ht 64.0 in | Wt 165.8 lb

## 2013-10-06 DIAGNOSIS — E119 Type 2 diabetes mellitus without complications: Secondary | ICD-10-CM

## 2013-10-06 DIAGNOSIS — E663 Overweight: Secondary | ICD-10-CM

## 2013-10-06 MED ORDER — LISINOPRIL 5 MG PO TABS: ORAL_TABLET | ORAL | Status: DC

## 2013-10-06 MED ORDER — GLUCOSE BLOOD VI STRP: ORAL_STRIP | Status: AC

## 2013-10-06 NOTE — Progress Notes (Signed)
Pre-visit discussion using our clinic review tool. No additional management support is needed unless otherwise documented below in the visit note.  

## 2013-10-06 NOTE — Patient Instructions (Signed)
You are doing very well on your current regimen.  Since your BP is so low,   You can suspend the lisinopril   we will  resume for either:  bp > 130/80   OR   the presence of protein in your urine at next check   If Dr [name] will not do the labs I need you can make appt here to have labs done

## 2013-10-06 NOTE — Progress Notes (Signed)
Patient ID: Melissa Werner, female   DOB: [birth year], 48 y.o.   MRN: [id number]   Patient Active Problem List   Diagnosis Date Noted  . Overweight (BMI 25.0-29.9) 10/17/2012  . Type II or unspecified type diabetes mellitus without mention of complication, not stated as uncontrolled 03/22/2011  . Hyperlipidemia   . GERD (gastroesophageal reflux disease)     Subjective:  CC:   Chief Complaint  Patient presents with  . Follow-up    6 month  . Diabetes    HPI:   Melissa Werner is a 48 y.o. female who presents for Follow up on chronic conditions including DM type 2,  hyperlipidemia , and overweight.  Since she has been seeing Endocrinology for Type 2 DM management she has gained control of DM and has lost weight.    Last eye exam was last week  Normal  Foot exam as well today.  She is exercising 5 days per week and tolerating her medications and low glycemic index diet.  No new issues.        Past Medical History  Diagnosis Date  . Diabetes mellitus   . Hyperlipidemia   . GERD (gastroesophageal reflux disease)     No past surgical history on file.     The following portions of the patient's history were reviewed and updated as appropriate: Allergies, current medications, and problem list.    Review of Systems:   Patient denies headache, fevers, malaise, unintentional weight loss, skin rash, eye pain, sinus congestion and sinus pain, sore throat, dysphagia,  hemoptysis , cough, dyspnea, wheezing, chest pain, palpitations, orthopnea, edema, abdominal pain, nausea, melena, diarrhea, constipation, flank pain, dysuria, hematuria, urinary  Frequency, nocturia, numbness, tingling, seizures,  Focal weakness, Loss of consciousness,  Tremor, insomnia, depression, anxiety, and suicidal ideation.     History   Social History  . Marital Status: Married    Spouse Name: N/A    Number of Children: N/A  . Years of Education: N/A   Occupational History  . Not on file.   Social  History Main Topics  . Smoking status: Never Smoker   . Smokeless tobacco: Never Used  . Alcohol Use: No  . Drug Use: No  . Sexual Activity: Not on file   Other Topics Concern  . Not on file   Social History Narrative  . No narrative on file    Objective:  Filed Vitals:   10/06/13 1509  BP: 106/66  Pulse: 81  Temp: 98.6 F (37 C)  Resp: 18     General appearance: alert, cooperative and appears stated age Ears: normal TM's and external ear canals both ears Throat: lips, mucosa, and tongue normal; teeth and gums normal Neck: no adenopathy, no carotid bruit, supple, symmetrical, trachea midline and thyroid not enlarged, symmetric, no tenderness/mass/nodules Back: symmetric, no curvature. ROM normal. No CVA tenderness. Lungs: clear to auscultation bilaterally Heart: regular rate and rhythm, S1, S2 normal, no murmur, click, rub or gallop Abdomen: soft, non-tender; bowel sounds normal; no masses,  no organomegaly Pulses: 2+ and symmetric Skin: Skin color, texture, turgor normal. No rashes or lesions Lymph nodes: Cervical, supraclavicular, and axillary nodes normal.  Assessment and Plan:  Type II or unspecified type diabetes mellitus without mention of complication, not stated as uncontrolled Improved with addition of Zigduo.  No changes today.  Foot exam normal.  Stopping lisinopril as she has no evidence of hypertension or proteinuria,    Overweight (BMI 25.0-29.9) Improved BMI with Zigduo.  Body mass index is 28.44 kg/(m^2). I hav recommended continued  wt loss of 10% of body weigh over the next 6 months using a low glycemic index diet and regular exercise a minimum of 5 days per week.      Updated Medication List Outpatient Encounter Prescriptions as of 10/06/2013  Medication Sig  . Dapagliflozin-Metformin HCl ER (XIGDUO XR) 06-998 MG TB24 Take 1 capsule by mouth 2 (two) times daily.  Marland Kitchen. glipiZIDE (GLUCOTROL) 10 MG tablet Take 1 tablet (10 mg total) by mouth 2 (two)  times daily before a meal.  . glucose blood test strip Use once daily to check cbg   DM type 2  . NUVARING 0.12-0.015 MG/24HR vaginal ring   . ONE TOUCH ULTRA TEST test strip USE THREE TIMES DAILY TO CHECK CBG  . [DISCONTINUED] glucose blood test strip Use three times daily to check cbg  Uncontrolled DM type 2  . [DISCONTINUED] lisinopril (PRINIVIL,ZESTRIL) 5 MG tablet TAKE 1 TABLET (5 MG TOTAL) BY MOUTH DAILY.  . [DISCONTINUED] lisinopril (PRINIVIL,ZESTRIL) 5 MG tablet TAKE 1 TABLET (5 MG TOTAL) BY MOUTH DAILY.  . [DISCONTINUED] oseltamivir (TAMIFLU) 75 MG capsule Take 1 capsule (75 mg total) by mouth 2 (two) times daily.  . [DISCONTINUED] promethazine (PHENERGAN) 25 MG suppository Place 1 suppository (25 mg total) rectally every 6 (six) hours as needed for nausea or vomiting.     Orders Placed This Encounter  Procedures  . Hemoglobin A1c  . HM DIABETES EYE EXAM  . HM DIABETES FOOT EXAM    Return in about 6 months (around 04/08/2014) for follow up diabetes.

## 2013-10-07 NOTE — Assessment & Plan Note (Signed)
Improved BMI with Zigduo.  Body mass index is 28.44 kg/(m^2). I hav recommended continued  wt loss of 10% of body weigh over the next 6 months using a low glycemic index diet and regular exercise a minimum of 5 days per week.

## 2013-10-07 NOTE — Assessment & Plan Note (Signed)
Improved with addition of Zigduo.  No changes today.  Foot exam normal.  Stopping lisinopril as she has no evidence of hypertension or proteinuria,

## 2014-01-13 LAB — HM PAP SMEAR: HM PAP: NORMAL

## 2014-02-09 LAB — HEMOGLOBIN A1C: HEMOGLOBIN A1C: 7.7 % — AB (ref 4.0–6.0)

## 2014-02-09 LAB — BASIC METABOLIC PANEL
BUN: 12 mg/dL (ref 4–21)
Creatinine: 0.8 mg/dL (ref 0.5–1.1)
Glucose: 250 mg/dL
POTASSIUM: 4.1 mmol/L (ref 3.4–5.3)
Sodium: 137 mmol/L (ref 137–147)

## 2014-03-29 ENCOUNTER — Ambulatory Visit: Payer: Self-pay | Admitting: Nurse Practitioner

## 2014-04-12 ENCOUNTER — Ambulatory Visit: Payer: 59 | Admitting: Internal Medicine

## 2014-04-15 LAB — HM MAMMOGRAPHY: HM MAMMO: NORMAL

## 2014-05-14 ENCOUNTER — Ambulatory Visit (INDEPENDENT_AMBULATORY_CARE_PROVIDER_SITE_OTHER): Payer: BLUE CROSS/BLUE SHIELD | Admitting: Internal Medicine

## 2014-05-14 ENCOUNTER — Encounter: Payer: Self-pay | Admitting: Internal Medicine

## 2014-05-14 VITALS — BP 120/72 | HR 78 | Temp 98.2°F | Resp 14 | Ht 64.0 in | Wt 165.5 lb

## 2014-05-14 DIAGNOSIS — R5383 Other fatigue: Secondary | ICD-10-CM

## 2014-05-14 DIAGNOSIS — E559 Vitamin D deficiency, unspecified: Secondary | ICD-10-CM

## 2014-05-14 DIAGNOSIS — K219 Gastro-esophageal reflux disease without esophagitis: Secondary | ICD-10-CM

## 2014-05-14 DIAGNOSIS — E1165 Type 2 diabetes mellitus with hyperglycemia: Secondary | ICD-10-CM

## 2014-05-14 DIAGNOSIS — IMO0002 Reserved for concepts with insufficient information to code with codable children: Secondary | ICD-10-CM

## 2014-05-14 NOTE — Progress Notes (Signed)
Patient ID: Melissa Werner, female   DOB: [birth year], 49 y.o.   MRN: [id number] Patient Active Problem List   Diagnosis Date Noted  . Overweight (BMI 25.0-29.9) 10/17/2012  . Type II diabetes mellitus, uncontrolled 03/22/2011  . Hyperlipidemia   . GERD (gastroesophageal reflux disease)     Subjective:  CC:   Chief Complaint  Patient presents with  . Follow-up    6 month general  . Diabetes    HPI:   Melissa ElkDena L Martello is a 49 y.o. female who presents for   Past Medical History  Diagnosis Date  . Diabetes mellitus   . Hyperlipidemia   . GERD (gastroesophageal reflux disease)     No past surgical history on file.     The following portions of the patient's history were reviewed and updated as appropriate: Allergies, current medications, and problem list.    Review of Systems:   Patient denies headache, fevers, malaise, unintentional weight loss, skin rash, eye pain, sinus congestion and sinus pain, sore throat, dysphagia,  hemoptysis , cough, dyspnea, wheezing, chest pain, palpitations, orthopnea, edema, abdominal pain, nausea, melena, diarrhea, constipation, flank pain, dysuria, hematuria, urinary  Frequency, nocturia, numbness, tingling, seizures,  Focal weakness, Loss of consciousness,  Tremor, insomnia, depression, anxiety, and suicidal ideation.     History   Social History  . Marital Status: Married    Spouse Name: N/A  . Number of Children: N/A  . Years of Education: N/A   Occupational History  . Not on file.   Social History Main Topics  . Smoking status: Never Smoker   . Smokeless tobacco: Never Used  . Alcohol Use: No  . Drug Use: No  . Sexual Activity: Not on file   Other Topics Concern  . Not on file   Social History Narrative    Objective:  Filed Vitals:   05/14/14 1604  BP: 120/72  Pulse: 78  Temp: 98.2 F (36.8 C)  Resp: 14     General appearance: alert, cooperative and appears stated age Ears: normal TM's and external ear  canals both ears Throat: lips, mucosa, and tongue normal; teeth and gums normal Neck: no adenopathy, no carotid bruit, supple, symmetrical, trachea midline and thyroid not enlarged, symmetric, no tenderness/mass/nodules Back: symmetric, no curvature. ROM normal. No CVA tenderness. Lungs: clear to auscultation bilaterally Heart: regular rate and rhythm, S1, S2 normal, no murmur, click, rub or gallop Abdomen: soft, non-tender; bowel sounds normal; no masses,  no organomegaly Pulses: 2+ and symmetric Skin: Skin color, texture, turgor normal. No rashes or lesions Lymph nodes: Cervical, supraclavicular, and axillary nodes normal.  Assessment and Plan:  Type II diabetes mellitus, uncontrolled a1c has risen from 6.9 to 7.7 under the care of endorinologist Dr [name].  She had no proteiinuria by dec 2015 labs from Dr [name]. She is taking glipizide and dapagliflozin/metformim  And exercising a maximum of 5 days per week. Screening for lipid disorders, thyroid and diabetes to be done  Soon      GERD (gastroesophageal reflux disease) Managed with one omeprazole daily     Updated Medication List Outpatient Encounter Prescriptions as of 05/14/2014  Medication Sig  . Dapagliflozin-Metformin HCl ER (XIGDUO XR) 06-998 MG TB24 Take 1 capsule by mouth 2 (two) times daily.  Marland Kitchen. glucose blood test strip Use once daily to check cbg   DM type 2  . NUVARING 0.12-0.015 MG/24HR vaginal ring   . omeprazole (PRILOSEC) 20 MG capsule Take 20 mg by mouth daily.  .Marland Kitchen  ONE TOUCH ULTRA TEST test strip USE THREE TIMES DAILY TO CHECK CBG  . glipiZIDE (GLUCOTROL) 10 MG tablet Take 1 tablet (10 mg total) by mouth 2 (two) times daily before a meal.     Orders Placed This Encounter  Procedures  . HM MAMMOGRAPHY  . HM PAP SMEAR  . Basic metabolic panel  . Hemoglobin A1c  . TSH  . Lipid panel  . Vit D  25 hydroxy (rtn osteoporosis monitoring)  . Hepatic function panel    Return in about 6 months (around  11/14/2014).

## 2014-05-16 NOTE — Assessment & Plan Note (Signed)
Managed with one omeprazole daily

## 2014-05-16 NOTE — Assessment & Plan Note (Addendum)
a1c has risen from 6.9 to 7.7 under the care of endorinologist Dr [name].  She had no proteiinuria by dec 2015 labs from Dr [name]. She is taking glipizide and dapagliflozin/metformim  And exercising a maximum of 5 days per week. Screening for lipid disorders, thyroid and diabetes to be done  Soon

## 2014-05-26 ENCOUNTER — Encounter: Payer: Self-pay | Admitting: *Deleted

## 2014-08-25 LAB — HEMOGLOBIN A1C: HEMOGLOBIN A1C: 7.3 % — AB (ref 4.0–6.0)

## 2014-09-27 ENCOUNTER — Ambulatory Visit: Payer: 59 | Attending: Orthopedic Surgery | Admitting: Physical Therapy

## 2014-09-27 ENCOUNTER — Encounter: Payer: Self-pay | Admitting: Physical Therapy

## 2014-09-27 DIAGNOSIS — M25512 Pain in left shoulder: Secondary | ICD-10-CM | POA: Insufficient documentation

## 2014-09-27 NOTE — Therapy (Signed)
[facility] MAIN [facility] SERVICES [address] [place], [place], [address] Phone: [phone]   Fax:  [phone]  Physical Therapy Evaluation  Patient Details  Name: Melissa Werner MRN: [id number] Date of Birth: [birth year] Referring Provider:  Juanell Fairly, MD  Encounter Date: 09/27/2014      PT End of Session - 09/27/14 1619    Visit Number 1   Number of Visits 1   PT Start Time 0345   PT Stop Time 0420   PT Time Calculation (min) 35 min      Past Medical History  Diagnosis Date  . Diabetes mellitus   . Hyperlipidemia   . GERD (gastroesophageal reflux disease)     History reviewed. No pertinent past surgical history.  There were no vitals filed for this visit.  Visit Diagnosis:  Shoulder pain, acute, left      Subjective Assessment - 09/27/14 1554    Subjective Patient reports that she has had LUE shoulder pain for several years but 3 months ago it became a larger problem and now is having decreased ROM.   Currently in Pain? Yes   Pain Score 5    Pain Location Shoulder   Pain Orientation Left   Pain Descriptors / Indicators Guarding   Pain Type Acute pain   Pain Onset Other (comment)  months   Pain Frequency Intermittent   Aggravating Factors  moving   Pain Relieving Factors rest            [facility] PT Assessment - 09/27/14 1556    Assessment   Medical Diagnosis shoulder pain   Onset Date/Surgical Date 06/27/14   Hand Dominance Left   Next MD Visit Sept 8,16   Home Environment   Living Environment Private residence   Living Arrangements Spouse/significant other   Available Help at Discharge Family   Prior Function   Level of Independence Independent      Evaluation:   AROM flex 0-150 with pain, abd 0-150 with pain Strength -3/5 flex, -3/5 abd, IR 2/5, ER 3/5 Painful arc left shoulder 85-105 Sensation : intact Special test: + Leanord Asal, + Neers LUE Patient reports pain that is intermittent and  worse with movement.                                   Plan - 09/27/14 1620    Clinical Impression Statement Patient presents with LUE shoulder pain that is agravated with IR and abduction. Patient has decreased AROM and PROM and + neers impingement and + hHwkins kennedy. She has 4/5 strenght LUE IR and ER. Patient says that she has a bone spur and conservative treatment will not effect the  outcome  of this pain source.    Rehab Potential Poor   Clinical Impairments Affecting Rehab Potential Bone spur in left shoulder per patinet    PT Frequency One time visit   Recommended Other Services Refer back to MD   Consulted and Agree with Plan of Care Patient         Problem List Patient Active Problem List   Diagnosis Date Noted  . Overweight (BMI 25.0-29.9) 10/17/2012  . Type II diabetes mellitus, uncontrolled 03/22/2011  . Hyperlipidemia   . GERD (gastroesophageal reflux disease)     Ezekiel Ina 09/27/2014, 4:26 PM  [facility] MAIN [facility] SERVICES [address] [place], [place], [address] Phone:  [phone]   Fax:  [phone]

## 2014-09-29 ENCOUNTER — Ambulatory Visit: Payer: 59 | Admitting: Physical Therapy

## 2014-10-03 ENCOUNTER — Other Ambulatory Visit: Payer: Self-pay | Admitting: Internal Medicine

## 2014-10-04 ENCOUNTER — Encounter: Payer: BLUE CROSS/BLUE SHIELD | Admitting: Physical Therapy

## 2014-10-06 ENCOUNTER — Encounter: Payer: BLUE CROSS/BLUE SHIELD | Admitting: Physical Therapy

## 2014-11-16 ENCOUNTER — Encounter: Payer: Self-pay | Admitting: Internal Medicine

## 2014-11-16 ENCOUNTER — Ambulatory Visit (INDEPENDENT_AMBULATORY_CARE_PROVIDER_SITE_OTHER): Payer: 59 | Admitting: Internal Medicine

## 2014-11-16 ENCOUNTER — Ambulatory Visit: Payer: BLUE CROSS/BLUE SHIELD

## 2014-11-16 VITALS — BP 126/80 | HR 81 | Temp 98.4°F | Resp 12 | Ht 64.0 in | Wt 168.5 lb

## 2014-11-16 DIAGNOSIS — E785 Hyperlipidemia, unspecified: Secondary | ICD-10-CM | POA: Diagnosis not present

## 2014-11-16 DIAGNOSIS — E119 Type 2 diabetes mellitus without complications: Secondary | ICD-10-CM

## 2014-11-16 DIAGNOSIS — Z23 Encounter for immunization: Secondary | ICD-10-CM | POA: Diagnosis not present

## 2014-11-16 DIAGNOSIS — K219 Gastro-esophageal reflux disease without esophagitis: Secondary | ICD-10-CM | POA: Diagnosis not present

## 2014-11-16 DIAGNOSIS — E663 Overweight: Secondary | ICD-10-CM | POA: Diagnosis not present

## 2014-11-16 MED ORDER — CELECOXIB 200 MG PO CAPS: 200.0000 mg | ORAL_CAPSULE | Freq: Two times a day (BID) | ORAL | Status: DC

## 2014-11-16 NOTE — Patient Instructions (Signed)
We are checking your annual urinalysis for proteinuria today  You received the Pneumovax vaccine today   Please return for fasting labs at your earliest convenience.  I'll see you in 6 months

## 2014-11-16 NOTE — Progress Notes (Signed)
Pre-visit discussion using our clinic review tool. No additional management support is needed unless otherwise documented below in the visit note.  

## 2014-11-19 NOTE — Assessment & Plan Note (Signed)
Discussed current controversy regarding prolonged use of PPI in patients without documented Barretts esophagus.  Patient has no prior EGD but has been on PPI therapy for > 5 years (per patient).  Suggested trial of pepcid 20 mg daily.  If GERD symptoms return,  advised her to accept referral for EGD.  

## 2014-11-19 NOTE — Assessment & Plan Note (Signed)
a1c has improven to 7.3 under the care of endorinologist Dr Tedd Sias.  She had no proteiinuria by dec 2015 labs from Dr Tedd Sias. She is taking glipizide and dapagliflozin/metformim  And exercising a maximum of 5 days per week. Screening for lipid disorders, thyroid is due

## 2014-11-19 NOTE — Assessment & Plan Note (Signed)
I have addressed  BMI and recommended wt loss of 10% of body weigh over the next 6 months using a low glycemic index diet and regular exercise a minimum of 5 days per week.   

## 2014-11-19 NOTE — Assessment & Plan Note (Signed)
LDL has been <  70 on diet historically; repeat lipids are due  Lab Results  Component Value Date   CHOL 169 04/27/2013   HDL 53.80 04/27/2013   LDLCALC 84 04/27/2013   TRIG 155.0* 04/27/2013   CHOLHDL 3 04/27/2013

## 2014-11-19 NOTE — Progress Notes (Signed)
Subjective:  Patient ID: Melissa Werner, female    DOB: 07-11-1965  Age: 49 y.o. MRN: [id number]  CC: The primary encounter diagnosis was Diabetes mellitus without complication. Diagnoses of Need for prophylactic vaccination against Streptococcus pneumoniae (pneumococcus), Overweight (BMI 25.0-29.9), Hyperlipidemia, and Gastroesophageal reflux disease without esophagitis were also pertinent to this visit.  HPI Melissa Werner presents for follo w up on Type 2 DM and overweight.  She  feels generally well, is exercising several times per week and checking blood sugars once daily at variable times.  BS have been under 150 fasting and < 150 post prandially.  Denies any recent hypoglyemic events.  Taking her medications as directed. Following a carbohydrate modified diet 90% of th t ime .  denies numbness, burning and tingling of extremities. Appetite is good.     Outpatient Prescriptions Prior to Visit  Medication Sig Dispense Refill  . Dapagliflozin-Metformin HCl ER (XIGDUO XR) 06-998 MG TB24 Take 1 capsule by mouth 2 (two) times daily.    Marland Kitchen glucose blood test strip Use once daily to check cbg   DM type 2 100 each 1  . NUVARING 0.12-0.015 MG/24HR vaginal ring     . omeprazole (PRILOSEC) 20 MG capsule Take 20 mg by mouth daily.    . ONE TOUCH ULTRA TEST test strip USE 3 TIMES DAILY TO CHECK BLOOD SUGAR. 100 each 2  . glipiZIDE (GLUCOTROL) 10 MG tablet Take 1 tablet (10 mg total) by mouth 2 (two) times daily before a meal. 60 tablet 1   No facility-administered medications prior to visit.    Review of Systems;  Patient denies headache, fevers, malaise, unintentional weight loss, skin rash, eye pain, sinus congestion and sinus pain, sore throat, dysphagia,  hemoptysis , cough, dyspnea, wheezing, chest pain, palpitations, orthopnea, edema, abdominal pain, nausea, melena, diarrhea, constipation, flank pain, dysuria, hematuria, urinary  Frequency, nocturia, numbness, tingling, seizures,  Focal  weakness, Loss of consciousness,  Tremor, insomnia, depression, anxiety, and suicidal ideation.      Objective:  BP 126/80 mmHg  Pulse 81  Temp(Src) 98.4 F (36.9 C) (Oral)  Resp 12  Ht  (1.626 m)  Wt 168 lb 8 oz (76.431 kg)  BMI 28.91 kg/m2  SpO2 99%  LMP 11/14/2014 (Exact Date)  BP Readings from Last 3 Encounters:  11/16/14 126/80  05/14/14 120/72  10/06/13 106/66    Wt Readings from Last 3 Encounters:  11/16/14 168 lb 8 oz (76.431 kg)  05/14/14 165 lb 8 oz (75.07 kg)  10/06/13 165 lb 12 oz (75.184 kg)    General appearance: alert, cooperative and appears stated age Neck: no adenopathy, no carotid bruit, supple, symmetrical, trachea midline and thyroid not enlarged, symmetric, no tenderness/mass/nodules Back: symmetric, no curvature. ROM normal. No CVA tenderness. Lungs: clear to auscultation bilaterally Heart: regular rate and rhythm, S1, S2 normal, no murmur, click, rub or gallop Abdomen: soft, non-tender; bowel sounds normal; no masses,  no organomegaly Pulses: 2+ and symmetric   Lab Results  Component Value Date   HGBA1C 7.3* 08/25/2014   HGBA1C 7.7* 02/09/2014   HGBA1C 6.9* 08/31/2013    Lab Results  Component Value Date   CREATININE 0.8 02/09/2014   CREATININE 0.7 04/27/2013   CREATININE 0.7 07/07/2012    Lab Results  Component Value Date   GLUCOSE 124* 04/27/2013   CHOL 169 04/27/2013   TRIG 155.0* 04/27/2013   HDL 53.80 04/27/2013   LDLCALC 84 04/27/2013   ALT 12 04/27/2013   AST  13 04/27/2013   NA 137 02/09/2014   K 4.1 02/09/2014   CL 106 04/27/2013   CREATININE 0.8 02/09/2014   BUN 12 02/09/2014   CO2 24 04/27/2013   HGBA1C 7.3* 08/25/2014   MICROALBUR 0.4 04/27/2013    No results found.  Assessment & Plan:   Problem List Items Addressed This Visit      Unprioritized   Hyperlipidemia    LDL has been <  70 on diet historically; repeat lipids are due  Lab Results  Component Value Date   CHOL 169 04/27/2013   HDL 53.80  04/27/2013   LDLCALC 84 04/27/2013   TRIG 155.0* 04/27/2013   CHOLHDL 3 04/27/2013         GERD (gastroesophageal reflux disease)    Discussed current controversy regarding prolonged use of PPI in patients without documented Barretts esophagus.  Patient has no prior EGD but has been on PPI therapy for > 5 years (per patient).  Suggested trial of pepcid 20 mg daily.  If GERD symptoms return,  advised her to accept referral for EGD.       Diabetes mellitus without complication - Primary    a1c has improven to 7.3 under the care of endorinologist Dr Tedd Sias.  She had no proteiinuria by dec 2015 labs from Dr Tedd Sias. She is taking glipizide and dapagliflozin/metformim  And exercising a maximum of 5 days per week. Screening for lipid disorders, thyroid is due          Relevant Medications   saxagliptin HCl (ONGLYZA) 5 MG TABS tablet   Other Relevant Orders   Microalbumin / creatinine urine ratio   Overweight (BMI 25.0-29.9)    I have addressed  BMI and recommended wt loss of 10% of body weigh over the next 6 months using a low glycemic index diet and regular exercise a minimum of 5 days per week.         Other Visit Diagnoses    Need for prophylactic vaccination against Streptococcus pneumoniae (pneumococcus)        Relevant Orders    Pneumococcal polysaccharide vaccine 23-valent greater than or equal to 2yo subcutaneous/IM (Completed)       I am having Ms. Gust start on celecoxib. I am also having her maintain her NUVARING, glipiZIDE, Dapagliflozin-Metformin HCl ER, glucose blood, omeprazole, ONE TOUCH ULTRA TEST, and saxagliptin HCl.  Meds ordered this encounter  Medications  . saxagliptin HCl (ONGLYZA) 5 MG TABS tablet    Sig: Take 1 tablet by mouth daily.  . celecoxib (CELEBREX) 200 MG capsule    Sig: Take 1 capsule (200 mg total) by mouth 2 (two) times daily.    Dispense:  60 capsule    Refill:  1    There are no discontinued medications.  Follow-up: No Follow-up on  file.   Sherlene Shams, MD

## 2014-11-23 ENCOUNTER — Ambulatory Visit: Payer: 59 | Attending: Orthopedic Surgery

## 2014-11-23 DIAGNOSIS — M25512 Pain in left shoulder: Secondary | ICD-10-CM | POA: Diagnosis present

## 2014-11-23 DIAGNOSIS — M7582 Other shoulder lesions, left shoulder: Secondary | ICD-10-CM | POA: Diagnosis present

## 2014-11-23 DIAGNOSIS — R29898 Other symptoms and signs involving the musculoskeletal system: Secondary | ICD-10-CM | POA: Insufficient documentation

## 2014-11-23 DIAGNOSIS — M25612 Stiffness of left shoulder, not elsewhere classified: Secondary | ICD-10-CM

## 2014-11-23 NOTE — Patient Instructions (Signed)
[email or web address] Scapular retractions 2x10 Seated posture with lumbar roll PROM Shoulder Internal Rotation 2x10

## 2014-11-23 NOTE — Therapy (Addendum)
[place] [facility] MAIN [facility] SERVICES [address] [place], [place], [address] Phone: [phone]   Fax:  [phone]  Physical Therapy Evaluation  Patient Details  Name: Melissa Werner MRN: [id number] Date of Birth: 1966/01/29 Referring Johnpaul Gillentine:  Juanell Fairly, MD  Encounter Date: 11/23/2014      PT End of Session - 11/23/14 1218    Visit Number 1   Number of Visits 9   Date for PT Re-Evaluation 12/21/14   PT Start Time 0804   PT Stop Time 0900   PT Time Calculation (min) 56 min   Activity Tolerance Patient tolerated treatment well   Behavior During Therapy [facility] for tasks assessed/performed      Past Medical History  Diagnosis Date  . Diabetes mellitus   . Hyperlipidemia   . GERD (gastroesophageal reflux disease)     History reviewed. No pertinent past surgical history.  There were no vitals filed for this visit.  Visit Diagnosis:  Decreased ROM of left shoulder  Pain in joint, shoulder region, left  Weakness of left upper extremity      Subjective Assessment - 11/23/14 1216    Subjective Pt reports has been having left shoulder impingement for about 4-5 months, with a gradual onset.  Pt had imaging in July which showed she has a bone spur in her scapula which is causing the impingement.  Pt noticed improvement in her ROM after taking Celebrex two weeks ago.  Her left shoulder pain is not limiting her at work and denies any pain or numbness and tingling currently.   She has pain (4-5/10) with movements that involve shoulder internal rotation such as "reaching for something."  Pt had a previous PT evaluation on 09/27/14 with no subsequent PT visits.   Pt saw her MD a few weeks ago who wants her to try PT again and surgery is not likely at this time.   Patient Stated Goals pt want to increase her left shoulder strength and ROM   Currently in Pain? No/denies   Pain Score 0-No pain   Pain Onset Other (comment)  months             [facility] PT Assessment - 11/24/14 0001    Assessment   Medical Diagnosis shoulder pain   Onset Date/Surgical Date 06/27/14   Hand Dominance Left   Next MD Visit Sept 8,16   Home Environment   Living Environment Private residence   Living Arrangements Spouse/significant other   Available Help at Discharge Family   Prior Function   Level of Independence Independent         PAIN: 0/5  POSTURE: Rounded shoulder, forward head posture, in sitting Pt shrugs her left shoulder during shoulder flexion and abduction   AROM: L shoulder flexion: 120 in sitting L shoulder abduction: 100 in sitting L shoulder external rotation: able to reach behind her head L shoulder internal rotation: to L4 with pain  PROM: in supine Left shoulder flexion: 140 with pain and soft end feel Left shoulder abduction: 100 degrees with pain and soft end feel L shoulder internal rotation: ~45 degrees L shoulder external rotation: ~10 degrees with pain and soft end feel  Palpation:  Increased tension in left upper trap versus right upper trap Hypomobile C2-T7 with no pain Hypomobile glenohumeral and pain with grade 3 A/P and inferior glide       STRENGTH:  Graded on a 0-5 scale Muscle Group Left Right  Shoulder flex    Shoulder  Abd 4 (pain) 4+  Shoulder IR/ER within available range 3+ (pain)   Middle trap 3 (pain) 3+  Lower trap 3 (pain) 3+   SENSATION: UE light touch intact    SPECIAL TESTS: Neer's test: + Leanord Asal: +   OUTCOME MEASURES: TEST Outcome Interpretation  Quick Dash 13.15      there ex: Scapular retractions x10 Educated pt on using seated lumbar roll for improved posture Shoulder internal rotation with strap x10 Shoulder abduction AAROM with wand in sitting x10 Pt requires min verbal, tactile and visual cues for correct exercise technique   Educated pt on forward head posture and rounded shoulders                PT Education - 11/23/14 1218    Education  provided Yes   Education Details plan of care, HEP, posture   Person(s) Educated Patient   Methods Explanation   Comprehension Verbalized understanding             PT Long Term Goals - 11/23/14 1749    PT LONG TERM GOAL #1   Title pt's quick DASH score will improve by at least 8 ponts indicating improved physical function and symptoms of L shoulder    Baseline 13.15   Time 4   Period Weeks   Status New   PT LONG TERM GOAL #2   Title pt's left internal rotation will be at least T8 to improve ability to perform functional movements such undoing her bra strap   Baseline to L on left   Time 4   Period Days   Status New   PT LONG TERM GOAL #3   Title pt's left shoulder strength will be at least 4/5 in all planes to improve her ability to participate in pilates   Baseline less than 4/5 in all planes limited by pain   Time 4   Period Weeks   Status New               Plan - 11/23/14 1230    Clinical Impression Statement pt is a pleasant 49year old female with chronic history of left shoulder impingement.  Pt presents with signs and symptoms suggesting left shoulder impingement.  Pt presents with decreased shoulder PROM/AROM and strength limited by pain and with hypomobility in her cervical/thoracic spine and glenohumeral joint.  pt does have a bone spur in her scapula that causing the impingement (per patient) and will benefit from skilled PT services to address impairments to decrease stress on her shoulder and improve functional mobility to return to PLOF.     Pt will benefit from skilled therapeutic intervention in order to improve on the following deficits Decreased range of motion;Pain;Hypomobility;Impaired flexibility;Postural dysfunction;Decreased strength;Decreased mobility   Rehab Potential Fair   PT Frequency 2x / week   PT Duration 4 weeks   PT Treatment/Interventions Cryotherapy;Electrical Stimulation;Iontophoresis /ml Dexamethasone;Moist Heat;Therapeutic  exercise;Therapeutic activities;Neuromuscular re-education;Patient/family education;Manual techniques;Passive range of motion         Problem List Patient Active Problem List   Diagnosis Date Noted  . Overweight (BMI 25.0-29.9) 10/17/2012  . Diabetes mellitus without complication 03/22/2011  . Hyperlipidemia   . GERD (gastroesophageal reflux disease)    Janus Molder, SPT This entire session was performed under direct supervision and direction of a licensed therapist/therapist assistant . I have personally read, edited and approve of the note as written. Carlyon Shadow. Tortorici, PT, DPT [phone]  Tortorici,Ashley 11/24/2014, 2:21 PM  [facility]  MAIN [facility] SERVICES [address] [place], [place], [address] Phone: [phone]   Fax:  [phone]

## 2014-11-25 ENCOUNTER — Other Ambulatory Visit (INDEPENDENT_AMBULATORY_CARE_PROVIDER_SITE_OTHER): Payer: 59

## 2014-11-25 ENCOUNTER — Other Ambulatory Visit: Payer: 59

## 2014-11-25 ENCOUNTER — Ambulatory Visit: Payer: 59

## 2014-11-25 DIAGNOSIS — M25612 Stiffness of left shoulder, not elsewhere classified: Secondary | ICD-10-CM

## 2014-11-25 DIAGNOSIS — E119 Type 2 diabetes mellitus without complications: Secondary | ICD-10-CM

## 2014-11-25 DIAGNOSIS — R5383 Other fatigue: Secondary | ICD-10-CM | POA: Diagnosis not present

## 2014-11-25 DIAGNOSIS — E1165 Type 2 diabetes mellitus with hyperglycemia: Secondary | ICD-10-CM

## 2014-11-25 DIAGNOSIS — R29898 Other symptoms and signs involving the musculoskeletal system: Secondary | ICD-10-CM

## 2014-11-25 DIAGNOSIS — E559 Vitamin D deficiency, unspecified: Secondary | ICD-10-CM | POA: Diagnosis not present

## 2014-11-25 DIAGNOSIS — IMO0002 Reserved for concepts with insufficient information to code with codable children: Secondary | ICD-10-CM

## 2014-11-25 DIAGNOSIS — M7582 Other shoulder lesions, left shoulder: Secondary | ICD-10-CM | POA: Diagnosis not present

## 2014-11-25 DIAGNOSIS — M25512 Pain in left shoulder: Secondary | ICD-10-CM

## 2014-11-25 LAB — VITAMIN D 25 HYDROXY (VIT D DEFICIENCY, FRACTURES): VITD: 39.33 ng/mL (ref 30.00–100.00)

## 2014-11-25 LAB — LIPID PANEL
Cholesterol: 138 mg/dL (ref 0–200)
HDL: 51.6 mg/dL (ref >39.00)
LDL CALC: 51 mg/dL (ref 0–99)
NonHDL: 86.56
TRIGLYCERIDES: 176 mg/dL — AB (ref 0.0–149.0)
Total CHOL/HDL Ratio: 3
VLDL: 35.2 mg/dL (ref 0.0–40.0)

## 2014-11-25 LAB — BASIC METABOLIC PANEL
BUN: 11 mg/dL (ref 6–23)
CHLORIDE: 107 meq/L (ref 96–112)
CO2: 25 mEq/L (ref 19–32)
Calcium: 8.8 mg/dL (ref 8.4–10.5)
Creatinine, Ser: 0.63 mg/dL (ref 0.40–1.20)
GFR: 106.88 mL/min (ref >60.00)
Glucose, Bld: 116 mg/dL — ABNORMAL HIGH (ref 70–99)
POTASSIUM: 4.4 meq/L (ref 3.5–5.1)
Sodium: 140 mEq/L (ref 135–145)

## 2014-11-25 LAB — HEPATIC FUNCTION PANEL
ALK PHOS: 43 U/L (ref 39–117)
ALT: 10 U/L (ref 0–35)
AST: 11 U/L (ref 0–37)
Albumin: 3.8 g/dL (ref 3.5–5.2)
BILIRUBIN TOTAL: 0.4 mg/dL (ref 0.2–1.2)
Bilirubin, Direct: 0.1 mg/dL (ref 0.0–0.3)
Total Protein: 6.4 g/dL (ref 6.0–8.3)

## 2014-11-25 LAB — TSH: TSH: 3.02 u[IU]/mL (ref 0.35–4.50)

## 2014-11-25 NOTE — Patient Instructions (Signed)
[email or web address] Shoulder external rotation with red band 2x10 Shoulder internal rotation with red band 2x10

## 2014-11-25 NOTE — Therapy (Signed)
[place] MAIN [facility] SERVICES [address] [place], [place], [address] Phone: [phone]   Fax:  [phone]  Physical Therapy Treatment  Patient Details  Name: Melissa Werner MRN: [id number] Date of Birth: 1965/04/25 Referring Provider:  Thornton Park, MD  Encounter Date: 11/25/2014      PT End of Session - 11/25/14 1738    Visit Number 2   Number of Visits 9   Date for PT Re-Evaluation 12/21/14   PT Start Time 4332   PT Stop Time 1630   PT Time Calculation (min) 45 min   Activity Tolerance Patient tolerated treatment well   Behavior During Therapy [facility] for tasks assessed/performed      Past Medical History  Diagnosis Date  . Diabetes mellitus   . Hyperlipidemia   . GERD (gastroesophageal reflux disease)     History reviewed. No pertinent past surgical history.  There were no vitals filed for this visit.  Visit Diagnosis:  Decreased ROM of left shoulder  Pain in joint, shoulder region, left  Weakness of left upper extremity      Subjective Assessment - 11/25/14 1736    Subjective pt reports she has been compliant with HEP and notes her ROM has improved.  No pain currently.  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   Patient Stated Goals pt want to increase her left shoulder strength and ROM   Currently in Pain? No/denies   Pain Score 0-No pain   Pain Onset Other (comment)  months        Manual therapy  L shoulder PROM in supine (flexion, abduction, ER) x12 L shoulder PROM in supine (flexion, abduction, ER) x10 with small oscillations at end range  Right pec MET to x 2 min with 6 sec lift Towel roll in supine to stretch anterior pec GH mobilizations grade III A/P in open pack position 3 bouts of 30 sec  patient is not tender to palpatoin   There ex:  Shoulder external rotation in side lying with towel roll x10 Shoulder external rotation in side lying with  towel roll x10 with 1# Shoulder external rotation in side lying with towel roll x10 with 2# Standing external rotation and internal rotation with towel roll and red band x10 each Pt required verbal, visual and tactile cueing for correct exercise technique                       PT Education - 11/25/14 1737    Education provided Yes   Education Details plan of care, new exercises for HEP   Person(s) Educated Patient   Methods Explanation   Comprehension Verbalized understanding             PT Long Term Goals - 11/23/14 1749    PT LONG TERM GOAL #1   Title pt's quick DASH score will improve by at least 8 ponts indicating improved physical function and symptoms of L shoulder    Baseline 13.15   Time 4   Period Weeks   Status New   PT LONG TERM GOAL #2   Title pt's left internal rotation will be at least T8 to improve ability to perform functional movements such undoing her bra strap   Baseline to L on left   Time 4   Period Days   Status New   PT LONG TERM GOAL #3   Title pt's left shoulder  strength will be at least 4/5 in all planes to improve her ability to participate in pilates   Baseline less than 4/5 in all planes limited by pain   Time 4   Period Weeks   Status New               Plan - 11/25/14 1741    Clinical Impression Statement pt did well today with manual therapy and with rotator cuff strengthening and experienced minimal increase in pain throughout session.  pt was able to relax more and guard less as the session progressed.    Pt will benefit from skilled therapeutic intervention in order to improve on the following deficits Decreased range of motion;Pain;Hypomobility;Impaired flexibility;Postural dysfunction;Decreased strength;Decreased mobility   Rehab Potential Fair   PT Frequency 2x / week   PT Duration 4 weeks   PT Treatment/Interventions Cryotherapy;Electrical Stimulation;Iontophoresis 27m/ml Dexamethasone;Moist Heat;Therapeutic  exercise;Therapeutic activities;Neuromuscular re-education;Patient/family education;Manual techniques;Passive range of motion;Dry needling;Taping   PT Next Visit Plan progress HEP        Problem List Patient Active Problem List   Diagnosis Date Noted  . Overweight (BMI 25.0-29.9) 10/17/2012  . Diabetes mellitus without complication 071/21/9758 . Hyperlipidemia   . GERD (gastroesophageal reflux disease)    JRenford Dills SPT This entire session was performed under direct supervision and direction of a licensed therapist/therapist assistant . I have personally read, edited and approve of the note as written. AGorden Harms Tortorici, PT, DPT #[phone] Tortorici,Ashley 11/26/2014, 3:23 PM  [place] [facility] [address] [place] [address]Phone: [phone]  Fax:  [phone]

## 2014-11-28 ENCOUNTER — Encounter: Payer: Self-pay | Admitting: Internal Medicine

## 2014-11-29 ENCOUNTER — Ambulatory Visit: Payer: 59 | Attending: Orthopedic Surgery

## 2014-11-29 DIAGNOSIS — M25512 Pain in left shoulder: Secondary | ICD-10-CM | POA: Insufficient documentation

## 2014-11-29 DIAGNOSIS — M7582 Other shoulder lesions, left shoulder: Secondary | ICD-10-CM | POA: Diagnosis present

## 2014-11-29 DIAGNOSIS — M25612 Stiffness of left shoulder, not elsewhere classified: Secondary | ICD-10-CM

## 2014-11-29 DIAGNOSIS — R29898 Other symptoms and signs involving the musculoskeletal system: Secondary | ICD-10-CM | POA: Diagnosis present

## 2014-11-30 NOTE — Therapy (Signed)
[place] MAIN [facility] SERVICES [address] [place], [place], [address] Phone: [phone]   Fax:  [phone]  Physical Therapy Treatment  Patient Details  Name: Melissa Werner MRN: [id number] Date of Birth: 1965-07-20 Referring Provider:  Crecencio Mc, MD  Encounter Date: 11/29/2014      PT End of Session - 11/30/14 1256    Visit Number 3   Number of Visits 9   Date for PT Re-Evaluation 12/21/14   PT Start Time 1600   PT Stop Time 1645   PT Time Calculation (min) 45 min   Activity Tolerance Patient tolerated treatment well   Behavior During Therapy [facility] for tasks assessed/performed      Past Medical History  Diagnosis Date  . Diabetes mellitus   . Hyperlipidemia   . GERD (gastroesophageal reflux disease)     History reviewed. No pertinent past surgical history.  There were no vitals filed for this visit.  Visit Diagnosis:  Decreased ROM of left shoulder  Pain in joint, shoulder region, left  Weakness of left upper extremity      Subjective Assessment - 11/30/14 1254    Subjective Pt relates she is doing well since her last session.  She has been compliant with her HEP and has not experienced an increase in pain with her exercises.  Pt notes she has made improvements since the start of PT, especially ROM and notes she is able to perform functional movements better.  She believes she may only need a few for sessions to reach or goals.     Patient Stated Goals pt want to increase her left shoulder strength and ROM   Currently in Pain? No/denies   Pain Onset Other (comment)  months        Manual therapy: Left shoulder external rotation MET x 2 min, with shoulder at 45 degrees abduction in supine Left shoulder external rotation MET x30 sec min with shoulder at 90 degrees abduction in supine, pt experienced increased pain and thus was discontinued Horizontal adduction stretch x30 sec Left shoulder PROM (flexion,  abduction) x5 each   There ex: Supine towel roll pec stretch with shoulders at 45 degrees of abduction 3x30 sec Supine towel roll pec stretch with shoulders at 80 degrees of abduction x30 sec Supine D1 extension x5, no pain Supine D2 flexion x5, slight increase in shoulder pain  Wall slide with lift x6, was discontinued due to increase pain in shoulder with each repetition  Doorway pec stretch x30 sec, increase in shoulder pain Standing D1 extension x10, no pain Standing D2 flexion x5, increase in pain with each repetition, around ~80 degrees of flexion and shrugs left shoulder Shoulder extension with red band 2x10, occasional left shoulder shrug  Pt required verbal, visual and tactile cues for correct exercise technique                            PT Education - 11/30/14 1255    Education provided Yes   Education Details plan of care and new exercises for HEP   Person(s) Educated Patient   Methods Explanation   Comprehension Verbalized understanding             PT Long Term Goals - 11/23/14 1749    PT LONG TERM GOAL #1   Title pt's quick DASH score will improve by at least 8 ponts indicating improved physical function and symptoms of L shoulder  Baseline 13.15   Time 4   Period Weeks   Status New   PT LONG TERM GOAL #2   Title pt's left internal rotation will be at least T8 to improve ability to perform functional movements such undoing her bra strap   Baseline to L on left   Time 4   Period Days   Status New   PT LONG TERM GOAL #3   Title pt's left shoulder strength will be at least 4/5 in all planes to improve her ability to participate in pilates   Baseline less than 4/5 in all planes limited by pain   Time 4   Period Weeks   Status New               Plan - 11/30/14 1257    Clinical Impression Statement Pt notes improved motion and strength outside of PT and today she did well but did experience an increase in pain with certain  exercises today, especially with D2 flexion in standing.  pt does shrug her left shoulder occasionally and requires cues to decrease shrugging.     Pt will benefit from skilled therapeutic intervention in order to improve on the following deficits Decreased range of motion;Pain;Hypomobility;Impaired flexibility;Postural dysfunction;Decreased strength;Decreased mobility   Rehab Potential Fair   PT Frequency 2x / week   PT Duration 4 weeks   PT Treatment/Interventions Cryotherapy;Electrical Stimulation;Iontophoresis 57m/ml Dexamethasone;Moist Heat;Therapeutic exercise;Therapeutic activities;Neuromuscular re-education;Patient/family education;Manual techniques;Passive range of motion;Dry needling;Taping   PT Next Visit Plan progress HEP        Problem List Patient Active Problem List   Diagnosis Date Noted  . Overweight (BMI 25.0-29.9) 10/17/2012  . Diabetes mellitus without complication ([place] 023/76/2831 . Hyperlipidemia   . GERD (gastroesophageal reflux disease)    JRenford Dills SPT This entire session was performed under direct supervision and direction of a licensed therapist/therapist assistant . I have personally read, edited and approve of the note as written. AGorden Harms Tortorici, PT, DPT #[phone] Tortorici,Ashley 11/30/2014, 2:50 PM  [place]MAIN [facility] GroupSERVICES [address] [address]Phone: [phone]  Fax:  [phone]

## 2014-11-30 NOTE — Patient Instructions (Signed)
[email or web address] Supine towel roll pec stretch with shoulder at 45 degrees of abduction 3x30 sec Shoulder extension with red band 2x10

## 2014-12-01 ENCOUNTER — Ambulatory Visit: Payer: 59

## 2014-12-06 ENCOUNTER — Ambulatory Visit: Payer: 59

## 2014-12-06 DIAGNOSIS — M7582 Other shoulder lesions, left shoulder: Secondary | ICD-10-CM | POA: Diagnosis not present

## 2014-12-06 DIAGNOSIS — M25612 Stiffness of left shoulder, not elsewhere classified: Secondary | ICD-10-CM

## 2014-12-06 DIAGNOSIS — R29898 Other symptoms and signs involving the musculoskeletal system: Secondary | ICD-10-CM

## 2014-12-06 DIAGNOSIS — M25512 Pain in left shoulder: Secondary | ICD-10-CM

## 2014-12-07 NOTE — Patient Instructions (Signed)
[email or web address] Supine serratus anterior punches 2x10  Sidelying shoulder abduction with  2x10 Prone shoulder extension 2x10

## 2014-12-07 NOTE — Therapy (Signed)
[place] [facility] MAIN [facility] SERVICES [address] [place], [place], [address] Phone: [phone]   Fax:  [phone]  Physical Therapy Treatment  Patient Details  Name: Melissa Werner MRN: [id number] Date of Birth: 04-Nov-1965 Referring Provider:  Juanell Fairly, MD  Encounter Date: 12/06/2014      PT End of Session - 12/07/14 0920    Visit Number 4   Number of Visits 9   Date for PT Re-Evaluation 12/21/14   PT Start Time 1600   PT Stop Time 1645   PT Time Calculation (min) 45 min   Activity Tolerance Patient tolerated treatment well   Behavior During Therapy [facility] for tasks assessed/performed      Past Medical History  Diagnosis Date  . Diabetes mellitus   . Hyperlipidemia   . GERD (gastroesophageal reflux disease)     History reviewed. No pertinent past surgical history.  There were no vitals filed for this visit.  Visit Diagnosis:  Decreased ROM of left shoulder  Pain in joint, shoulder region, left  Weakness of left upper extremity      Subjective Assessment - 12/07/14 0919    Subjective Pt reports she is doing well and is making a conscious effort to use her left UE more now that she has less pain. Pt has noticed she is now able to perform certain activities now with less pain.  Pt performs her HEP 2-3x/week.    Patient Stated Goals pt want to increase her left shoulder strength and ROM   Currently in Pain? No/denies   Pain Score 0-No pain   Pain Onset Other (comment)  months      Manual therapy: L shoulder AROM in sitting pre thoracic mobs Flexion: 140 Abduction: 135 Thoracic P/A mobs grade 3, 2x30 sec T1-T7 L shoulder AROM in sitting post thoracic mobs Flexion: 148 Adduction: 140 Left glenohumeral inferior glide grade 2with shoulder flexed 2x30 sec Left glenohumeral inferior glide grade 2with shoulder 90 degrees abducted 2x30 sec Left glenohumeral A/P glide in open packed position 2x30 sec  There ex: Supine  serratus anterior punches 2x10 with 5# weight Sidelying shoulder external rotation with 2# 2x10 and towel roll Sidelying shoulder abduction with 2# 2x10 Prone shoulder extension 2x10 Pt required verbal and tactile cues for correct technique and to perform exercise within pain free range                            PT Education - 12/07/14 0920    Education provided Yes   Education Details plan of care and new exercises for HEP   Person(s) Educated Patient   Methods Explanation   Comprehension Verbalized understanding             PT Long Term Goals - 11/23/14 1749    PT LONG TERM GOAL #1   Title pt's quick DASH score will improve by at least 8 ponts indicating improved physical function and symptoms of L shoulder    Baseline 13.15   Time 4   Period Weeks   Status New   PT LONG TERM GOAL #2   Title pt's left internal rotation will be at least T8 to improve ability to perform functional movements such undoing her bra strap   Baseline to L on left   Time 4   Period Days   Status New   PT LONG TERM GOAL #3   Title pt's left shoulder strength will be at  least 4/5 in all planes to improve her ability to participate in pilates   Baseline less than 4/5 in all planes limited by pain   Time 4   Period Weeks   Status New               Plan - 12/07/14 0920    Clinical Impression Statement Pt responded well to thoracic mobs with improved left shoulder ROM and would benefit from continued manual therapy and instruction in self thoracic mobs for HEP.     Pt will benefit from skilled therapeutic intervention in order to improve on the following deficits Decreased range of motion;Pain;Hypomobility;Impaired flexibility;Postural dysfunction;Decreased strength;Decreased mobility   Rehab Potential Fair   PT Frequency 2x / week   PT Duration 4 weeks   PT Treatment/Interventions Cryotherapy;Electrical Stimulation;Iontophoresis /ml Dexamethasone;Moist  Heat;Therapeutic exercise;Therapeutic activities;Neuromuscular re-education;Patient/family education;Manual techniques;Passive range of motion;Dry needling;Taping   PT Next Visit Plan progress HEP        Problem List Patient Active Problem List   Diagnosis Date Noted  . Overweight (BMI 25.0-29.9) 10/17/2012  . Diabetes mellitus without complication (HCC) 03/22/2011  . Hyperlipidemia   . GERD (gastroesophageal reflux disease)    Janus Molder, SPT This entire session was performed under direct supervision and direction of a licensed therapist/therapist assistant . I have personally read, edited and approve of the note as written. Carlyon Shadow. Tortorici, PT, DPT [phone] Tortorici,Ashley 12/07/2014, 10:47 AM  [place] [facility] MAIN [facility] SERVICES [address] [place], [place], [address] Phone: [phone]   Fax:  [phone]

## 2014-12-08 ENCOUNTER — Ambulatory Visit: Payer: 59

## 2014-12-08 DIAGNOSIS — M7582 Other shoulder lesions, left shoulder: Secondary | ICD-10-CM | POA: Diagnosis not present

## 2014-12-08 DIAGNOSIS — M25512 Pain in left shoulder: Secondary | ICD-10-CM

## 2014-12-08 DIAGNOSIS — R29898 Other symptoms and signs involving the musculoskeletal system: Secondary | ICD-10-CM

## 2014-12-08 DIAGNOSIS — M25612 Stiffness of left shoulder, not elsewhere classified: Secondary | ICD-10-CM

## 2014-12-09 NOTE — Therapy (Signed)
[facility] [address] [place], [place], [address] Phone: [phone]   Fax:  [phone]  Physical Therapy Treatment  Patient Details  Name: Melissa Werner MRN: [id number] Date of Birth: Feb 21, 1966 Referring Provider:  Juanell Werner, Kevin, MD  Encounter Date: 12/08/2014      PT End of Session - 12/09/14 1039    Visit Number 5   Number of Visits 9   Date for PT Re-Evaluation 12/21/14   PT Start Time 1600   PT Stop Time 1645   PT Time Calculation (min) 45 min   Activity Tolerance Patient tolerated treatment well   Behavior During Therapy [facility] for tasks assessed/performed      Past Medical History  Diagnosis Date  . Diabetes mellitus   . Hyperlipidemia   . GERD (gastroesophageal reflux disease)     History reviewed. No pertinent past surgical history.  There were no vitals filed for this visit.  Visit Diagnosis:  Decreased ROM of left shoulder  Pain in joint, shoulder region, left  Weakness of left upper extremity      Subjective Assessment - 12/09/14 1037    Subjective pt relates she is doing really well since the start of PT and was able to complete her Zumba after her last session of PT with minimal pain.  She feels like her ROM and strength has improved.   Patient Stated Goals pt want to increase her left shoulder strength and ROM   Currently in Pain? No/denies   Pain Score 0-No pain   Pain Onset Other (comment)  months      Manual therapy: L shoulder AROM in sitting pre thoracic mobs Flexion: 150 Abduction: 140 Thoracic P/A mobs grade 3, 3x30 sec T1-T7 L shoulder AROM in sitting post thoracic mobs Flexion: 150 Adduction: 150 Passive left pec stretch with arm ~10 abducted and externally rotated and pressure applied to shoulder 3x30 sec Pt reported decreased tightness in less shoulder restriction during shoulder AROM   There ex: Self-thoracic mobs in supine with towel roll placed in ~T5 x10,  pt required instruction on exercise technique Lat pull downs with red band 2x10 Cross over with red band 2x10 Wall push-ups x10, initially experienced pain but with each repetition pain decreased Pt required min verbal and tactile cues to decrease left shoulder shrug during exercises, especially during cross over with red band.                             PT Education - 12/09/14 1038    Education provided Yes   Education Details plan of care and self thoracic supine mobs with towel roll    Person(s) Educated Patient   Methods Explanation   Comprehension Verbalized understanding             PT Long Term Goals - 11/23/14 1749    PT LONG TERM GOAL #1   Title pt's quick DASH score will improve by at least 8 ponts indicating improved physical function and symptoms of L shoulder    Baseline 13.15   Time 4   Period Weeks   Status New   PT LONG TERM GOAL #2   Title pt's left internal rotation will be at least T8 to improve ability to perform functional movements such undoing her bra strap   Baseline to L on left   Time 4   Period Days   Status New   PT LONG  TERM GOAL #3   Title pt's left shoulder strength will be at least 4/5 in all planes to improve her ability to participate in pilates   Baseline less than 4/5 in all planes limited by pain   Time 4   Period Weeks   Status New               Plan - 12/09/14 1042    Clinical Impression Statement Pt continues to respond well to thoracic mobs with improved left shoulder ROM was instructed on how to perform self- thoracic mobs at home with towel roll.  Pt experienced minimal increase in pain with progression of strength exercises.  pt is progressing well and will likely be discharged next week with an HEP to maintain gains and improve gains made in PT.     Pt will benefit from skilled therapeutic intervention in order to improve on the following deficits Decreased range of  motion;Pain;Hypomobility;Impaired flexibility;Postural dysfunction;Decreased strength;Decreased mobility   Rehab Potential Fair   PT Frequency 2x / week   PT Duration 4 weeks   PT Treatment/Interventions Cryotherapy;Electrical Stimulation;Iontophoresis /ml Dexamethasone;Moist Heat;Therapeutic exercise;Therapeutic activities;Neuromuscular re-education;Patient/family education;Manual techniques;Passive range of motion;Dry needling;Taping   PT Next Visit Plan progress HEP        Problem List Patient Active Problem List   Diagnosis Date Noted  . Overweight (BMI 25.0-29.9) 10/17/2012  . Diabetes mellitus without complication (HCC) 03/22/2011  . Hyperlipidemia   . GERD (gastroesophageal reflux disease)    Melissa Werner, SPT This entire session was performed under direct supervision and direction of a licensed therapist/therapist assistant . I have personally read, edited and approve of the note as written. Melissa Werner, PT, DPT [id number]  Melissa Werner 12/09/2014, 1:18 PM  [place] [facility] SERVICES [address] [place], [place], [address] Phone: [phone]   Fax:  [phone]

## 2014-12-14 ENCOUNTER — Ambulatory Visit: Payer: 59

## 2014-12-14 DIAGNOSIS — R29898 Other symptoms and signs involving the musculoskeletal system: Secondary | ICD-10-CM

## 2014-12-14 DIAGNOSIS — M25612 Stiffness of left shoulder, not elsewhere classified: Secondary | ICD-10-CM

## 2014-12-14 DIAGNOSIS — M25512 Pain in left shoulder: Secondary | ICD-10-CM

## 2014-12-14 DIAGNOSIS — M7582 Other shoulder lesions, left shoulder: Secondary | ICD-10-CM | POA: Diagnosis not present

## 2014-12-15 NOTE — Therapy (Signed)
[place] [facility] [address] [place], [place], [address] Phone: [phone]   Fax:  [phone]  Physical Therapy Treatment  Patient Details  Name: Melissa Werner MRN: [id number] Date of Birth: 28-Jul-1965 No Data Recorded  Encounter Date: 12/14/2014      PT End of Session - 12/15/14 1232    Visit Number 6   Number of Visits 9   Date for PT Re-Evaluation 12/21/14   PT Start Time 1545   PT Stop Time 1630   PT Time Calculation (min) 45 min   Activity Tolerance Patient tolerated treatment well   Behavior During Therapy [facility] for tasks assessed/performed      Past Medical History  Diagnosis Date  . Diabetes mellitus   . Hyperlipidemia   . GERD (gastroesophageal reflux disease)     History reviewed. No pertinent past surgical history.  There were no vitals filed for this visit.  Visit Diagnosis:  Decreased ROM of left shoulder  Pain in joint, shoulder region, left  Weakness of left upper extremity      Subjective Assessment - 12/15/14 1232    Subjective Pt reports she is doing really well and notices minimal to no pain with functional activities.     Patient Stated Goals pt want to increase her left shoulder strength and ROM   Currently in Pain? No/denies   Pain Score 0-No pain   Pain Onset Other (comment)  months      Manual therapy: Thoracic P/A mobs grade 4, 3x30 sec T1-T7  Pt's thoracic spine mobility improved with P/A mobs Passive left pec stretch with arm ~10 abducted and externally rotated and pressure applied to shoulder 3x30 sec Inferior glenohumeral glides with shoulder at 90 degrees abduction 3x30 sec grade 3 A/P glenohumeral glides grade 2-3 3 x30 sec with shoulder in open packed position  L shoulder AROM in sitting post manual therapy Flexion: 160 Adduction: 154 Pt reported decreased tightness in less shoulder restriction during shoulder AROM   There ex: Supine D1 and D2 patters x10 each,  pt required tactile and verbal cueing for correct motion Standing D1 extension with yellow band x5 Standing D1 extension with red band 2x10, pt required tactile cueing to perform exercise in a fluid motion versus performing motion in a sequence Standing D2 flexion without resistance x10, pt required verbal and tactile cueing to decrease shoulder shrugging and correct technique Standing D2 flexion with yellow band 2x10, pt instructed to perform exercise in pain free range and to gradually increase range.                              PT Education - 12/15/14 1232    Education provided Yes   Education Details plan of care and HEP progression D1 and D2 UE patterns   Person(s) Educated Patient   Methods Explanation   Comprehension Verbalized understanding             PT Long Term Goals - 11/23/14 1749    PT LONG TERM GOAL #1   Title pt's quick DASH score will improve by at least 8 ponts indicating improved physical function and symptoms of L shoulder    Baseline 13.15   Time 4   Period Weeks   Status New   PT LONG TERM GOAL #2   Title pt's left internal rotation will be at least T8 to improve ability to perform functional movements such undoing  her bra strap   Baseline to L on left   Time 4   Period Days   Status New   PT LONG TERM GOAL #3   Title pt's left shoulder strength will be at least 4/5 in all planes to improve her ability to participate in pilates   Baseline less than 4/5 in all planes limited by pain   Time 4   Period Weeks   Status New               Plan - 12/15/14 1233    Clinical Impression Statement Pt continues to respond well to thoracic mobs and manual therapy with improved left shoulder ROM and experienced minimal increase in pain with progression of strength exercises.  Pt is now able to perform functional diagonal patterns with improved motion and less pain.  pt is progressing well and will likely be discharged on her next  session with a HEP to maintain gains and improve gains made in PT.      Pt will benefit from skilled therapeutic intervention in order to improve on the following deficits Decreased range of motion;Pain;Hypomobility;Impaired flexibility;Postural dysfunction;Decreased strength;Decreased mobility   Rehab Potential Fair   PT Frequency 2x / week   PT Duration 4 weeks   PT Treatment/Interventions Cryotherapy;Electrical Stimulation;Iontophoresis /ml Dexamethasone;Moist Heat;Therapeutic exercise;Therapeutic activities;Neuromuscular re-education;Patient/family education;Manual techniques;Passive range of motion;Dry needling;Taping   PT Next Visit Plan progress HEP        Problem List Patient Active Problem List   Diagnosis Date Noted  . Overweight (BMI 25.0-29.9) 10/17/2012  . Diabetes mellitus without complication (HCC) 03/22/2011  . Hyperlipidemia   . GERD (gastroesophageal reflux disease)    Janus Molder, SPT This entire session was performed under direct supervision and direction of a licensed therapist/therapist assistant . I have personally read, edited and approve of the note as written. Carlyon Shadow. Tortorici, PT, DPT [phone]  Tortorici,Ashley 12/15/2014, 4:38 PM  [place] [facility] MAIN [facility] SERVICES [address] [place], [place], [address] Phone: [phone]   Fax:  [phone]  Name: Melissa Werner MRN: [id number] Date of Birth: [birth year]

## 2014-12-16 ENCOUNTER — Ambulatory Visit: Payer: 59

## 2014-12-16 DIAGNOSIS — M7582 Other shoulder lesions, left shoulder: Secondary | ICD-10-CM | POA: Diagnosis not present

## 2014-12-16 DIAGNOSIS — R29898 Other symptoms and signs involving the musculoskeletal system: Secondary | ICD-10-CM

## 2014-12-16 DIAGNOSIS — M25612 Stiffness of left shoulder, not elsewhere classified: Secondary | ICD-10-CM

## 2014-12-16 DIAGNOSIS — M25512 Pain in left shoulder: Secondary | ICD-10-CM

## 2014-12-16 NOTE — Therapy (Signed)
[place] MAIN [facility] SERVICES [address] [place], [place], [address] Phone: [phone]   Fax:  [phone]  Physical Therapy Treatment/Discharge Note  11/23/14-12/16/14  Patient Details  Name: Melissa Werner MRN: [id number] Date of Birth: 1965/05/31 No Data Recorded  Encounter Date: 12/16/2014      PT End of Session - 12/16/14 1746    Visit Number 7   Number of Visits 9   Date for PT Re-Evaluation 12/21/14   PT Start Time 1600   PT Stop Time 1630   PT Time Calculation (min) 30 min   Activity Tolerance Patient tolerated treatment well   Behavior During Therapy [facility] for tasks assessed/performed      Past Medical History  Diagnosis Date  . Diabetes mellitus   . Hyperlipidemia   . GERD (gastroesophageal reflux disease)     History reviewed. No pertinent past surgical history.  There were no vitals filed for this visit.  Visit Diagnosis:  Decreased ROM of left shoulder  Pain in joint, shoulder region, left  Weakness of left upper extremity      Subjective Assessment - 12/16/14 1745    Subjective Pt relates she is doing well and feels she has made 90 % improvement since the start of PT.  pt notices pain with only a few movements, such as reaching out with arm flexed.  Pt has not had any problems at work, running or participating in [place] classes.   Patient Stated Goals pt want to increase her left shoulder strength and ROM   Currently in Pain? No/denies   Pain Score 0-No pain   Pain Onset Other (comment)  months     Manual therapy: Thoracic P/A mobs grade 4, 3x30 sec T1-T7   Pt's thoracic spine mobility improved with P/A mobs L shoulder AROM in sitting post manual therapy Flexion: 160 Adduction: 160 Pt reported decreased tightness in less shoulder restriction during shoulder AROM     There ex: Reassessed ROM and quick dash to assess progress towards goals Quick Dash: 3.94% Flexion in sitting: 160 Abduction in  sitting: 160 IR: to T10 IR: hand behind head Shoulder extension with red band x10 Wall slide with lift x10  Wall slide with life with yellow band x10 D2 flexion without resistance  Pt required minimal cueing for correct exercise technique                           PT Education - 12/16/14 1745    Education provided Yes   Education Details plan of care, Quick Dash value, review HEP   Person(s) Educated Patient   Methods Explanation   Comprehension Verbalized understanding             PT Long Term Goals - 12/16/14 1750    PT LONG TERM GOAL #1   Title pt's quick DASH score will improve by at least 8 ponts indicating improved physical function and symptoms of L shoulder    Baseline 13.15 on eval (9/27); 3.94% on 10/20   Time 4   Period Weeks   Status Achieved   PT LONG TERM GOAL #2   Title pt's left internal rotation will be at least T8 to improve ability to perform functional movements such undoing her bra strap   Baseline to L4 on eval; to T10 on 10/20   Time 4   Period Days   Status Partially Met   PT LONG TERM GOAL #3  Title pt's left shoulder strength will be at least 4/5 in all planes to improve her ability to participate in pilates   Baseline less than 4/5 in all planes limited by pain on eval ; pt has achieved 4/5 in all planes except internal rotatoin due to pain but is able to participate in all her recreational activities without pain on 10/20   Time 4   Period Weeks   Status Achieved               Plan - 12/16/14 1747    Clinical Impression Statement pt has made significant improvements since the start of PT and has met all her goals for PT.  pt reports she is able to perform all her functional UE movements to participate in her recreational activities and work.  Pt will be discharged at this time with a HEP to maintain and improve gains made in PT.     Pt will benefit from skilled therapeutic intervention in order to improve on the  following deficits Decreased range of motion;Pain;Hypomobility;Impaired flexibility;Postural dysfunction;Decreased strength;Decreased mobility   Rehab Potential Fair   PT Frequency 2x / week   PT Duration 4 weeks   PT Treatment/Interventions Cryotherapy;Electrical Stimulation;Iontophoresis 60m/ml Dexamethasone;Moist Heat;Therapeutic exercise;Therapeutic activities;Neuromuscular re-education;Patient/family education;Manual techniques;Passive range of motion;Dry needling;Taping   PT Next Visit Plan progress HEP        Problem List Patient Active Problem List   Diagnosis Date Noted  . Overweight (BMI 25.0-29.9) 10/17/2012  . Diabetes mellitus without complication ([place] 080/88/1103 . Hyperlipidemia   . GERD (gastroesophageal reflux disease)    JRenford Dills SPT JRenford Dills10/20/2016, 5:54 PM  This entire session was performed under direct supervision and direction of a licensed therapist/therapist assistant . I have personally read, edited and approve of the note as written. AGorden Harms Tortorici, PT, DPT #[id number]/24/16 0[place] [facility] [address] [place] [address]Phone: [phone]  Fax:  [phone] Name: Melissa SCHEIBMRN: [id number] of Birth: 115-Aug-1967

## 2015-02-10 ENCOUNTER — Encounter: Payer: Self-pay | Admitting: Physician Assistant

## 2015-02-10 ENCOUNTER — Ambulatory Visit: Payer: Self-pay | Admitting: Physician Assistant

## 2015-02-10 VITALS — BP 120/90 | HR 88 | Temp 98.7°F

## 2015-02-10 DIAGNOSIS — J018 Other acute sinusitis: Secondary | ICD-10-CM

## 2015-02-10 MED ORDER — AZITHROMYCIN 250 MG PO TABS: ORAL_TABLET | ORAL | Status: DC

## 2015-02-10 NOTE — Progress Notes (Signed)
S: c/o sharp ear pain this am, no fever/chills or drainage from ear, has a cold, sx for over a week, pain in ear better since she took ibuprofen  O: vitals wnl, nad, tms clear, nasal mucosa red and swollen, throat wnl, neck supple no lymph, lungs c t a, cv rrr  A: acute sinusitis, ear pain  P: zpack, ibuprofen prn

## 2015-03-15 DIAGNOSIS — E119 Type 2 diabetes mellitus without complications: Secondary | ICD-10-CM | POA: Diagnosis not present

## 2015-03-21 DIAGNOSIS — Z01419 Encounter for gynecological examination (general) (routine) without abnormal findings: Secondary | ICD-10-CM | POA: Diagnosis not present

## 2015-03-21 DIAGNOSIS — Z124 Encounter for screening for malignant neoplasm of cervix: Secondary | ICD-10-CM | POA: Diagnosis not present

## 2015-03-21 DIAGNOSIS — Z1151 Encounter for screening for human papillomavirus (HPV): Secondary | ICD-10-CM | POA: Diagnosis not present

## 2015-03-22 DIAGNOSIS — Z794 Long term (current) use of insulin: Secondary | ICD-10-CM | POA: Diagnosis not present

## 2015-03-22 DIAGNOSIS — E119 Type 2 diabetes mellitus without complications: Secondary | ICD-10-CM | POA: Diagnosis not present

## 2015-03-22 LAB — HEMOGLOBIN A1C: HEMOGLOBIN A1C: 7.3

## 2015-03-29 ENCOUNTER — Other Ambulatory Visit: Payer: Self-pay | Admitting: Internal Medicine

## 2015-03-29 DIAGNOSIS — Z1231 Encounter for screening mammogram for malignant neoplasm of breast: Secondary | ICD-10-CM

## 2015-03-31 ENCOUNTER — Ambulatory Visit
Admission: RE | Admit: 2015-03-31 | Discharge: 2015-03-31 | Disposition: A | Discharge status: Home / Self Care | Payer: 59 | Source: Ambulatory Visit | Attending: Internal Medicine | Admitting: Internal Medicine

## 2015-03-31 ENCOUNTER — Other Ambulatory Visit: Payer: Self-pay | Admitting: Internal Medicine

## 2015-03-31 DIAGNOSIS — Z1231 Encounter for screening mammogram for malignant neoplasm of breast: Secondary | ICD-10-CM | POA: Insufficient documentation

## 2015-05-24 ENCOUNTER — Ambulatory Visit (INDEPENDENT_AMBULATORY_CARE_PROVIDER_SITE_OTHER): Payer: 59 | Admitting: Internal Medicine

## 2015-05-24 ENCOUNTER — Encounter: Payer: Self-pay | Admitting: Internal Medicine

## 2015-05-24 VITALS — BP 104/70 | HR 94 | Temp 98.4°F | Resp 12 | Ht 64.0 in | Wt 175.0 lb

## 2015-05-24 DIAGNOSIS — E119 Type 2 diabetes mellitus without complications: Secondary | ICD-10-CM | POA: Diagnosis not present

## 2015-05-24 DIAGNOSIS — E663 Overweight: Secondary | ICD-10-CM | POA: Diagnosis not present

## 2015-05-24 NOTE — Progress Notes (Signed)
Pre-visit discussion using our clinic review tool. No additional management support is needed unless otherwise documented below in the visit note.  

## 2015-05-24 NOTE — Patient Instructions (Addendum)
Try the Heritage Flakes cereal available at [facility] on the bottom shelf, with vanilla flavored almond milk  Vilma MeckelJimmy Dean now makes a frozen breakfast frittata that can be microwaved in 2 minutes and is very low carb. Frittatas are similar to quiches without the crust   .Premier Protein shakes 160 cal 1 g sugar 30 g protein [facility] lots of good flavors    To make a low carb chip :  Take the Joseph's Lavash or Pita bread,  Or the Mission Low carb whole wheat tortilla   Place on metal cookie sheet  Brush with olive oil  Sprinkle garlic powder (NOT garlic salt), grated parmesan cheese, mediterranean seasoning , or all of them?  Bake at 225 or 250 for 90 minutes   We have substitutions for your potatoes!!  Try the mashed cauliflower and riced cauliflower dishes instead of rice and mashed potatoes  Mashed turnips are also very low carb!   For dessert:  Try the Dannon Lt n Fit greek yogurt dessert flavors and top with reddi Whip .  8 carbs,  80 calories   Try Oikos Triple Zero AustriaGreek Yogurt in the salted caramel, and the coffee flavors  With Whipped Cream for dessert     We discussed my concern about continuing your PPI (Prilosec) in light of the recently published studies suggesting an association with increased risk of dementia and kidney failure.  I advised  you to try using  famotidine 20 mg  twice daily, and I sent  an rx to your pharmacy.    if your reflux symptoms are controlled,  You can Continue the daily h2 blocker. If not,  We will resume a covered PPI

## 2015-05-24 NOTE — Progress Notes (Signed)
Subjective:  Patient ID: Melissa Werner, female    DOB: [birth year]  Age: 50 y.o. MRN: [id number]  CC: The primary encounter diagnosis was Overweight (BMI 25.0-29.9). A diagnosis of Diabetes mellitus without complication (HCC) was also pertinent to this visit.  HPI Melissa Werner presents for follow up on DM type 2, obesity ,  Recent weight gain noted of 7 lbs.  BMI now 30.  Sees Solum for Type 2 DM last seen in Jan. At that time her diabetes control had improved with most recent A1c of 7.3 on lantus, trulicity glipizde and metformin.  Tolerating meds thus far,  No numbness or tingling . No lows.  Not exercising.    Outpatient Prescriptions Prior to Visit  Medication Sig Dispense Refill  . glucose blood test strip Use once daily to check cbg   DM type 2 100 each 1  . Insulin Glargine (LANTUS SOLOSTAR) 100 UNIT/ML Solostar Pen Take 15 units subcutaneously each night    . metformin (FORTAMET) 1000 MG (OSM) 24 hr tablet Take 1,000 mg by mouth 2 (two) times daily with a meal.     . NUVARING 0.12-0.015 MG/24HR vaginal ring     . omeprazole (PRILOSEC) 20 MG capsule Take 20 mg by mouth daily.    . ONE TOUCH ULTRA TEST test strip USE 3 TIMES DAILY TO CHECK BLOOD SUGAR. 100 each 2  . TRULICITY 1.5 MG/0.5ML SOPN   2  . glipiZIDE (GLUCOTROL) 10 MG tablet Take 1 tablet (10 mg total) by mouth 2 (two) times daily before a meal. 60 tablet 1  . azithromycin (ZITHROMAX Z-PAK) 250 MG tablet 2 pills today then 1 pill a day for 4 days 6 each 0  . celecoxib (CELEBREX) 200 MG capsule Take 1 capsule (200 mg total) by mouth 2 (two) times daily. (Patient not taking: Reported on 02/10/2015) 60 capsule 1  . Dapagliflozin-Metformin HCl ER (XIGDUO XR) 06-998 MG TB24 Take 1 capsule by mouth 2 (two) times daily. Reported on 02/10/2015    . saxagliptin HCl (ONGLYZA) 5 MG TABS tablet Take 1 tablet by mouth daily. Reported on 02/10/2015     No facility-administered medications prior to visit.    Review of  Systems;  Patient denies headache, fevers, malaise, unintentional weight loss, skin rash, eye pain, sinus congestion and sinus pain, sore throat, dysphagia,  hemoptysis , cough, dyspnea, wheezing, chest pain, palpitations, orthopnea, edema, abdominal pain, nausea, melena, diarrhea, constipation, flank pain, dysuria, hematuria, urinary  Frequency, nocturia, numbness, tingling, seizures,  Focal weakness, Loss of consciousness,  Tremor, insomnia, depression, anxiety, and suicidal ideation.      Objective:  BP 104/70 mmHg  Pulse 94  Temp(Src) 98.4 F (36.9 C) (Oral)  Resp 12  Ht  (1.626 m)  Wt 175 lb (79.379 kg)  BMI 30.02 kg/m2  SpO2 100%  LMP 05/03/2015 (Approximate)  BP Readings from Last 3 Encounters:  05/24/15 104/70  02/10/15 120/90  11/16/14 126/80    Wt Readings from Last 3 Encounters:  05/24/15 175 lb (79.379 kg)  11/16/14 168 lb 8 oz (76.431 kg)  05/14/14 165 lb 8 oz (75.07 kg)    General appearance: alert, cooperative and appears stated age Ears: normal TM's and external ear canals both ears Throat: lips, mucosa, and tongue normal; teeth and gums normal Neck: no adenopathy, no carotid bruit, supple, symmetrical, trachea midline and thyroid not enlarged, symmetric, no tenderness/mass/nodules Back: symmetric, no curvature. ROM normal. No CVA tenderness. Lungs: clear to auscultation bilaterally Heart: regular  rate and rhythm, S1, S2 normal, no murmur, click, rub or gallop Abdomen: soft, non-tender; bowel sounds normal; no masses,  no organomegaly Pulses: 2+ and symmetric Skin: Skin color, texture, turgor normal. No rashes or lesions Lymph nodes: Cervical, supraclavicular, and axillary nodes normal.  Lab Results  Component Value Date   HGBA1C 7.3 03/22/2015   HGBA1C 7.3* 08/25/2014   HGBA1C 7.7* 02/09/2014    Lab Results  Component Value Date   CREATININE 0.63 11/25/2014   CREATININE 0.8 02/09/2014   CREATININE 0.7 04/27/2013    Lab Results  Component  Value Date   GLUCOSE 116* 11/25/2014   CHOL 138 11/25/2014   TRIG 176.0* 11/25/2014   HDL 51.60 11/25/2014   LDLCALC 51 11/25/2014   ALT 10 11/25/2014   AST 11 11/25/2014   NA 140 11/25/2014   K 4.4 11/25/2014   CL 107 11/25/2014   CREATININE 0.63 11/25/2014   BUN 11 11/25/2014   CO2 25 11/25/2014   TSH 3.02 11/25/2014   HGBA1C 7.3 03/22/2015   MICROALBUR 0.4 04/27/2013    Mm Screening Breast Tomo Bilateral  03/31/2015  CLINICAL DATA:  Screening. EXAM: DIGITAL SCREENING BILATERAL MAMMOGRAM WITH 3D TOMO WITH CAD COMPARISON:  Previous exam(s). ACR Breast Density Category c: The breast tissue is heterogeneously dense, which may obscure small masses. FINDINGS: There are no findings suspicious for malignancy. Images were processed with CAD. IMPRESSION: No mammographic evidence of malignancy. A result letter of this screening mammogram will be mailed directly to the patient. RECOMMENDATION: Screening mammogram in one year. (Code:SM-B-01Y) BI-RADS CATEGORY  1: Negative. Electronically Signed   By: Frederico HammanMichelle  Collins M.D.   On: 03/31/2015 16:01    Assessment & Plan:   Problem List Items Addressed This Visit    Diabetes mellitus without complication (HCC)    a1c has remained 7.3 under the care of endorinologist Dr [name].  She had no proteiinuria by dec 2015 labs from Dr [name]. She is taking glipizide and dapagliflozin/metformim  And exercising a maximum of 5 days per week. Screening for lipid disorders, thyroid is due            Overweight (BMI 25.0-29.9) - Primary    I have addressed  BMI and recommended wt loss of 10% of body weigh over the next 6 months using a low glycemic index diet and regular exercise a minimum of 5 days per week.         A total of 25 minutes of face to face time was spent with patient more than half of which was spent in counselling about the above mentioned conditions  and coordination of care   I have discontinued Melissa Werner's Dapagliflozin-Metformin HCl  ER, saxagliptin HCl, celecoxib, and azithromycin. I am also having her maintain her NUVARING, glipiZIDE, glucose blood, omeprazole, ONE TOUCH ULTRA TEST, metformin, Insulin Glargine, and TRULICITY.  No orders of the defined types were placed in this encounter.    Medications Discontinued During This Encounter  Medication Reason  . azithromycin (ZITHROMAX Z-PAK) 250 MG tablet Completed Course  . celecoxib (CELEBREX) 200 MG capsule Change in therapy  . Dapagliflozin-Metformin HCl ER (XIGDUO XR) 06-998 MG TB24   . saxagliptin HCl (ONGLYZA) 5 MG TABS tablet Change in therapy    Follow-up: Return in about 6 months (around 11/24/2015), or fasting labs prior .   Sherlene ShamsULLO, [place] Janusz L, MD

## 2015-05-25 NOTE — Assessment & Plan Note (Signed)
I have addressed  BMI and recommended wt loss of 10% of body weigh over the next 6 months using a low glycemic index diet and regular exercise a minimum of 5 days per week.   

## 2015-05-25 NOTE — Assessment & Plan Note (Signed)
a1c has remained 7.3 under the care of endorinologist Dr [name].  She had no proteiinuria by dec 2015 labs from Dr [name]. She is taking glipizide and dapagliflozin/metformim  And exercising a maximum of 5 days per week. Screening for lipid disorders, thyroid is due

## 2015-06-14 DIAGNOSIS — E119 Type 2 diabetes mellitus without complications: Secondary | ICD-10-CM | POA: Diagnosis not present

## 2015-06-14 DIAGNOSIS — Z794 Long term (current) use of insulin: Secondary | ICD-10-CM | POA: Diagnosis not present

## 2015-06-21 DIAGNOSIS — E119 Type 2 diabetes mellitus without complications: Secondary | ICD-10-CM | POA: Diagnosis not present

## 2015-09-29 ENCOUNTER — Encounter: Payer: Self-pay | Admitting: Pain Medicine

## 2015-09-29 ENCOUNTER — Ambulatory Visit: Payer: 59 | Attending: Pain Medicine | Admitting: Pain Medicine

## 2015-09-29 DIAGNOSIS — K219 Gastro-esophageal reflux disease without esophagitis: Secondary | ICD-10-CM | POA: Diagnosis not present

## 2015-09-29 DIAGNOSIS — M25551 Pain in right hip: Secondary | ICD-10-CM | POA: Insufficient documentation

## 2015-09-29 DIAGNOSIS — Z794 Long term (current) use of insulin: Secondary | ICD-10-CM | POA: Insufficient documentation

## 2015-09-29 DIAGNOSIS — Z6825 Body mass index (BMI) 25.0-25.9, adult: Secondary | ICD-10-CM | POA: Diagnosis not present

## 2015-09-29 DIAGNOSIS — E663 Overweight: Secondary | ICD-10-CM | POA: Insufficient documentation

## 2015-09-29 DIAGNOSIS — E119 Type 2 diabetes mellitus without complications: Secondary | ICD-10-CM | POA: Diagnosis not present

## 2015-09-29 DIAGNOSIS — E785 Hyperlipidemia, unspecified: Secondary | ICD-10-CM | POA: Insufficient documentation

## 2015-09-29 DIAGNOSIS — R1031 Right lower quadrant pain: Secondary | ICD-10-CM | POA: Diagnosis present

## 2015-09-29 MED ORDER — METHYLPREDNISOLONE ACETATE 80 MG/ML IJ SUSP
INTRAMUSCULAR | Status: AC
  Administered 2015-09-29: 16:00:00
  Filled 2015-09-29: qty 1

## 2015-09-29 MED ORDER — METHYLPREDNISOLONE ACETATE 80 MG/ML IJ SUSP: 80.0000 mg | Freq: Once | INTRAMUSCULAR | Status: DC

## 2015-09-29 MED ORDER — LIDOCAINE HCL (PF) 1 % IJ SOLN
INTRAMUSCULAR | Status: AC
  Administered 2015-09-29: 16:00:00
  Filled 2015-09-29: qty 5

## 2015-09-29 MED ORDER — IOPAMIDOL (ISOVUE-M 200) INJECTION 41%: 10.0000 mL | Freq: Once | INTRAMUSCULAR | Status: DC

## 2015-09-29 MED ORDER — ROPIVACAINE HCL 2 MG/ML IJ SOLN
INTRAMUSCULAR | Status: AC
  Administered 2015-09-29: 16:00:00
  Filled 2015-09-29: qty 10

## 2015-09-29 MED ORDER — LIDOCAINE HCL (PF) 1 % IJ SOLN: 10.0000 mL | Freq: Once | INTRAMUSCULAR | Status: DC

## 2015-09-29 MED ORDER — ROPIVACAINE HCL 2 MG/ML IJ SOLN: 9.0000 mL | Freq: Once | INTRAMUSCULAR | Status: DC

## 2015-09-29 MED ORDER — IOPAMIDOL (ISOVUE-M 200) INJECTION 41%
INTRAMUSCULAR | Status: AC
  Administered 2015-09-29: 16:00:00
  Filled 2015-09-29: qty 10

## 2015-09-29 NOTE — Patient Instructions (Signed)
GENERAL RISKS AND COMPLICATIONS  What are the risk, side effects and possible complications? Generally speaking, most procedures are safe.  However, with any procedure there are risks, side effects, and the possibility of complications.  The risks and complications are dependent upon the sites that are lesioned, or the type of nerve block to be performed.  The closer the procedure is to the spine, the more serious the risks are.  Great care is taken when placing the radio frequency needles, block needles or lesioning probes, but sometimes complications can occur. 1. Infection: Any time there is an injection through the skin, there is a risk of infection.  This is why sterile conditions are used for these blocks.  There are four possible types of infection. 1. Localized skin infection. 2. Central Nervous System Infection-This can be in the form of Meningitis, which can be deadly. 3. Epidural Infections-This can be in the form of an epidural abscess, which can cause pressure inside of the spine, causing compression of the spinal cord with subsequent paralysis. This would require an emergency surgery to decompress, and there are no guarantees that the patient would recover from the paralysis. 4. Discitis-This is an infection of the intervertebral discs.  It occurs in about 1% of discography procedures.  It is difficult to treat and it may lead to surgery.        2. Pain: the needles have to go through skin and soft tissues, will cause soreness.       3. Damage to internal structures:  The nerves to be lesioned may be near blood vessels or    other nerves which can be potentially damaged.       4. Bleeding: Bleeding is more common if the patient is taking blood thinners such as  aspirin, Coumadin, Ticiid, Plavix, etc., or if he/she have some genetic predisposition  such as hemophilia. Bleeding into the spinal canal can cause compression of the spinal  cord with subsequent paralysis.  This would require an  emergency surgery to  decompress and there are no guarantees that the patient would recover from the  paralysis.       5. Pneumothorax:  Puncturing of a lung is a possibility, every time a needle is introduced in  the area of the chest or upper back.  Pneumothorax refers to free air around the  collapsed lung(s), inside of the thoracic cavity (chest cavity).  Another two possible  complications related to a similar event would include: Hemothorax and Chylothorax.   These are variations of the Pneumothorax, where instead of air around the collapsed  lung(s), you may have blood or chyle, respectively.       6. Spinal headaches: They may occur with any procedures in the area of the spine.       7. Persistent CSF (Cerebro-Spinal Fluid) leakage: This is a rare problem, but may occur  with prolonged intrathecal or epidural catheters either due to the formation of a fistulous  track or a dural tear.       8. Nerve damage: By working so close to the spinal cord, there is always a possibility of  nerve damage, which could be as serious as a permanent spinal cord injury with  paralysis.       9. Death:  Although rare, severe deadly allergic reactions known as "Anaphylactic  reaction" can occur to any of the medications used.      10. Worsening of the symptoms:  We can always make thing worse.    What are the chances of something like this happening? Chances of any of this occuring are extremely low.  By statistics, you have more of a chance of getting killed in a motor vehicle accident: while driving to the hospital than any of the above occurring .  Nevertheless, you should be aware that they are possibilities.  In general, it is similar to taking a shower.  Everybody knows that you can slip, hit your head and get killed.  Does that mean that you should not shower again?  Nevertheless always keep in mind that statistics do not mean anything if you happen to be on the wrong side of them.  Even if a procedure has a 1  (one) in a 1,000,000 (million) chance of going wrong, it you happen to be that one..Also, keep in mind that by statistics, you have more of a chance of having something go wrong when taking medications.  Who should not have this procedure? If you are on a blood thinning medication (e.g. Coumadin, Plavix, see list of "Blood Thinners"), or if you have an active infection going on, you should not have the procedure.  If you are taking any blood thinners, please inform your physician.  How should I prepare for this procedure?  Do not eat or drink anything at least six hours prior to the procedure.  Bring a driver with you .  It cannot be a taxi.  Come accompanied by an adult that can drive you back, and that is strong enough to help you if your legs get weak or numb from the local anesthetic.  Take all of your medicines the morning of the procedure with just enough water to swallow them.  If you have diabetes, make sure that you are scheduled to have your procedure done first thing in the morning, whenever possible.  If you have diabetes, take only half of your insulin dose and notify our nurse that you have done so as soon as you arrive at the clinic.  If you are diabetic, but only take blood sugar pills (oral hypoglycemic), then do not take them on the morning of your procedure.  You may take them after you have had the procedure.  Do not take aspirin or any aspirin-containing medications, at least eleven (11) days prior to the procedure.  They may prolong bleeding.  Wear loose fitting clothing that may be easy to take off and that you would not mind if it got stained with Betadine or blood.  Do not wear any jewelry or perfume  Remove any nail coloring.  It will interfere with some of our monitoring equipment.  NOTE: Remember that this is not meant to be interpreted as a complete list of all possible complications.  Unforeseen problems may occur.  BLOOD THINNERS The following drugs  contain aspirin or other products, which can cause increased bleeding during surgery and should not be taken for 2 weeks prior to and 1 week after surgery.  If you should need take something for relief of minor pain, you may take acetaminophen which is found in Tylenol,m Datril, Anacin-3 and Panadol. It is not blood thinner. The products listed below are.  Do not take any of the products listed below in addition to any listed on your instruction sheet.  A.P.C or A.P.C with Codeine Codeine Phosphate Capsules #3 Ibuprofen Ridaura  ABC compound Congesprin Imuran rimadil  Advil Cope Indocin Robaxisal  Alka-Seltzer Effervescent Pain Reliever and Antacid Coricidin or Coricidin-D  Indomethacin Rufen    Alka-Seltzer plus Cold Medicine Cosprin Ketoprofen S-A-C Tablets  Anacin Analgesic Tablets or Capsules Coumadin Korlgesic Salflex  Anacin Extra Strength Analgesic tablets or capsules CP-2 Tablets Lanoril Salicylate  Anaprox Cuprimine Capsules Levenox Salocol  Anexsia-D Dalteparin Magan Salsalate  Anodynos Darvon compound Magnesium Salicylate Sine-off  Ansaid Dasin Capsules Magsal Sodium Salicylate  Anturane Depen Capsules Marnal Soma  APF Arthritis pain formula Dewitt's Pills Measurin Stanback  Argesic Dia-Gesic Meclofenamic Sulfinpyrazone  Arthritis Bayer Timed Release Aspirin Diclofenac Meclomen Sulindac  Arthritis pain formula Anacin Dicumarol Medipren Supac  Analgesic (Safety coated) Arthralgen Diffunasal Mefanamic Suprofen  Arthritis Strength Bufferin Dihydrocodeine Mepro Compound Suprol  Arthropan liquid Dopirydamole Methcarbomol with Aspirin Synalgos  ASA tablets/Enseals Disalcid Micrainin Tagament  Ascriptin Doan's Midol Talwin  Ascriptin A/D Dolene Mobidin Tanderil  Ascriptin Extra Strength Dolobid Moblgesic Ticlid  Ascriptin with Codeine Doloprin or Doloprin with Codeine Momentum Tolectin  Asperbuf Duoprin Mono-gesic Trendar  Aspergum Duradyne Motrin or Motrin IB Triminicin  Aspirin  plain, buffered or enteric coated Durasal Myochrisine Trigesic  Aspirin Suppositories Easprin Nalfon Trillsate  Aspirin with Codeine Ecotrin Regular or Extra Strength Naprosyn Uracel  Atromid-S Efficin Naproxen Ursinus  Auranofin Capsules Elmiron Neocylate Vanquish  Axotal Emagrin Norgesic Verin  Azathioprine Empirin or Empirin with Codeine Normiflo Vitamin E  Azolid Emprazil Nuprin Voltaren  Bayer Aspirin plain, buffered or children's or timed BC Tablets or powders Encaprin Orgaran Warfarin Sodium  Buff-a-Comp Enoxaparin Orudis Zorpin  Buff-a-Comp with Codeine Equegesic Os-Cal-Gesic   Buffaprin Excedrin plain, buffered or Extra Strength Oxalid   Bufferin Arthritis Strength Feldene Oxphenbutazone   Bufferin plain or Extra Strength Feldene Capsules Oxycodone with Aspirin   Bufferin with Codeine Fenoprofen Fenoprofen Pabalate or Pabalate-SF   Buffets II Flogesic Panagesic   Buffinol plain or Extra Strength Florinal or Florinal with Codeine Panwarfarin   Buf-Tabs Flurbiprofen Penicillamine   Butalbital Compound Four-way cold tablets Penicillin   Butazolidin Fragmin Pepto-Bismol   Carbenicillin Geminisyn Percodan   Carna Arthritis Reliever Geopen Persantine   Carprofen Gold's salt Persistin   Chloramphenicol Goody's Phenylbutazone   Chloromycetin Haltrain Piroxlcam   Clmetidine heparin Plaquenil   Cllnoril Hyco-pap Ponstel   Clofibrate Hydroxy chloroquine Propoxyphen         Before stopping any of these medications, be sure to consult the physician who ordered them.  Some, such as Coumadin (Warfarin) are ordered to prevent or treat serious conditions such as "deep thrombosis", "pumonary embolisms", and other heart problems.  The amount of time that you may need off of the medication may also vary with the medication and the reason for which you were taking it.  If you are taking any of these medications, please make sure you notify your pain physician before you undergo any  procedures.         Pain Management Discharge Instructions  General Discharge Instructions :  If you need to reach your doctor call: Monday-Friday 8:00 am - 4:00 pm at [phone] or toll free [phone].  After clinic hours [phone] to have operator reach doctor.  Bring all of your medication bottles to all your appointments in the pain clinic.  To cancel or reschedule your appointment with Pain Management please remember to call 24 hours in advance to avoid a fee.  Refer to the educational materials which you have been given on: General Risks, I had my Procedure. Discharge Instructions, Post Sedation.  Post Procedure Instructions:  The drugs you were given will stay in your system until tomorrow, so for the next 24 hours you should   not drive, make any legal decisions or drink any alcoholic beverages.  You may eat anything you prefer, but it is better to start with liquids then soups and crackers, and gradually work up to solid foods.  Please notify your doctor immediately if you have any unusual bleeding, trouble breathing or pain that is not related to your normal pain.  Depending on the type of procedure that was done, some parts of your body may feel week and/or numb.  This usually clears up by tonight or the next day.  Walk with the use of an assistive device or accompanied by an adult for the 24 hours.  You may use ice on the affected area for the first 24 hours.  Put ice in a Ziploc bag and cover with a towel and place against area 15 minutes on 15 minutes off.  You may switch to heat after 24 hours. 

## 2015-09-29 NOTE — Assessment & Plan Note (Signed)
Today, 09/29/2015, we will proceed with a diagnostic intra-articular hip joint injection under fluoroscopic guidance and IV sedation for the purpose of determining whether or not we are dealing with an intra-articular problem or if this is coming from somewhere else.

## 2015-09-29 NOTE — Progress Notes (Signed)
Patient's Name: Melissa Werner  Patient type: New patient ("FAST-TRACK" Evaluation)  MRN: [id number]  Service setting: Ambulatory outpatient  DOB: [birth year]  Location: [facility]  DOS: 09/29/2015  Primary Care Physician: Crecencio Mc, MD  Note by: Kathlen Brunswick. Dossie Arbour, M.D, DABA, DABAPM, DABPM, DABIPP, FIPP  Referring Physician: Crecencio Mc, MD  Specialty: Board-Certified Interventional Pain Management     Warning: This referral option does not include the extensive pharmacological evaluation required for [place] to take over the patient's medication management. The "Fast-Track" system is designed to bypass the new patient referral waiting list, as well as the normal patient evaluation process, in order to provide a patient in distress with a timely pain management intervention. Because the system was not designed to unfairly get a patient into our pain practice ahead of those already waiting, certain restrictions apply. By requesting a "Fast-Track" consult, the referring physician has opted to continue managing the patient's medications in order to get interventional urgent care.  Primary Reason(s) for Visit: Initial Patient Evaluation CC: Hip Pain (right) and Groin Pain (right)  HPI  Melissa Werner is a 50 y.o. year old, female patient, who comes today for a  "Fast-Track" new patient evaluation, as requested by Crecencio Mc, MD. The patient has been made aware that this type of referral option is reserved for the Interventional Pain Management portion of our practice and completely excludes the option of medication management. She has Hyperlipidemia; GERD (gastroesophageal reflux disease); Diabetes mellitus without complication ([place]); Overweight (BMI 25.0-29.9); and Acute right hip pain on her problem list.. Her primarily concern today is the Hip Pain (right) and Groin Pain (right)  Pain Assessment: Self-Reported Pain Score: 3/10             Reported level is  compatible with observation       Pain Descriptors / Indicators: Aching Pain Frequency: Intermittent  Onset and Duration: Sudden and Present less than 3 months Cause of pain: Unknown Severity: Getting worse, NAS-11 at its worse: 4/10, NAS-11 at its best: 0/10, NAS-11 now: 3/10 and NAS-11 on the average: 3/10 Timing: Not influenced by the time of the day Aggravating Factors: Bending, Kneeling, Prolonged sitting, Squatting and Stooping  Alleviating Factors: Standing and Walking Associated Problems: Pain that does not allow patient to sleep Quality of Pain: Aching, Annoying and Sharp Previous Examinations or Tests: The patient denies Biopsies, bone scans, CPT testing, CT scans, CT myelograms, discograms, nerve conduction test, endoscopies, epidurograms, MRI scans, myelograms, nerve blocks, spinal taps, x-rays, nerve conduction test, neurological evaluations, neurosurgical evaluations, orthopedic evaluations, chiropractic evaluation, or psychiatric evaluations. Previous Treatments: The patient denies Biofeedback, chiropractic manipulations, cryo-analgesia, epidural steroid injections, facet blocks, hypnotherapy, morphine pump, narcotic medications, physical therapy, pool exercises, radiofrequency, relaxation therapy, spinal cord stimulation, steroid treatments by mouth, stretching exercises, the use of a TENS unit, traction, or trigger point injections.  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 She indicates that for normal apparent reason that she can recall she started experiencing pain in the lateral aspect of her right leg and groin area associated with movement. Physical exam proved to be positive on the Patrick maneuver for pain coming from the right hip joint with very little range of motion. Today she comes in for a diagnostic right intra-articular hip joint injection.  Meds  The patient has a current medication list which includes the following prescription(s):  glucose blood, insulin glargine, metformin, nuvaring, omeprazole,  one touch ultra test, trulicity, and glipizide, and the following Facility-Administered Medications: iopamidol, lidocaine (pf), methylprednisolone acetate, and ropivacaine (pf) 2 mg/ml (0.2%).  ROS  Cardiovascular History: Negative for hypertension, coronary artery diseas, myocardial infraction, anticoagulant therapy or heart failure Pulmonary or Respiratory History: Negative for bronchial asthma, emphysema, chronic smoking, chronic bronchitis, sarcoidosis, tuberculosis or sleep apena Neurological History: Negative for epilepsy, stroke, urinary or fecal inontinence, spina bifida or tethered cord syndrome Psychological-Psychiatric History: Negative for anxiety, depression, schizophrenia, bipolar disorders or suicidal ideations or attempts Gastrointestinal History: Reflux or heatburn Genitourinary History: Negative for nephrolithiasis, hematuria, renal failure or chronic kidney disease Hematological History: Negative for anticoagulant therapy, anemia, bruising or bleeding easily, hemophilia, sickle cell disease or trait, thrombocytopenia or coagulupathies Endocrine History: Non-insulin-dependent diabetes mellitus Rheumatologic History: Negative for lupus, osteoarthritis, rheumatoid arthritis, myositis, polymyositis or fibromyagia Musculoskeletal History: Negative for myasthenia gravis, muscular dystrophy, multiple sclerosis or malignant hyperthermia Work History: Working full time  Allergies  Ms. Eustice is allergic to tetanus toxoids.  Laboratory Chemistry  Coagulation Parameters No results found for: INR, LABPROT, APTT, PLT  Note: I personally reviewed the above data. Results made available to patient  Recent Diagnostic Imaging  Mm Screening Breast Tomo Bilateral Result Date: 03/31/2015 CLINICAL DATA:  Screening. EXAM: DIGITAL SCREENING BILATERAL MAMMOGRAM WITH 3D TOMO WITH CAD COMPARISON:  Previous exam(s). ACR Breast  Density Category c: The breast tissue is heterogeneously dense, which may obscure small masses. FINDINGS: There are no findings suspicious for malignancy. Images were processed with CAD. IMPRESSION: No mammographic evidence of malignancy. A result letter of this screening mammogram will be mailed directly to the patient. RECOMMENDATION: Screening mammogram in one year. (Code:SM-B-01Y) BI-RADS CATEGORY  1: Negative. Electronically Signed   By: Ammie Ferrier M.D.   On: 03/31/2015 16:01   Note: Imaging results reviewed.  [place]  Medical:  Ms. Helbig  has a past medical history of Diabetes mellitus; GERD (gastroesophageal reflux disease); and Hyperlipidemia. Family: family history includes Breast cancer (age of onset: 96) in her maternal aunt; Cancer in her maternal aunt; Hyperlipidemia in her father. Surgical:  has no past surgical history on file. Tobacco:  reports that she has never smoked. She has never used smokeless tobacco. Alcohol:  reports that she does not drink alcohol. Drug:  reports that she does not use drugs. Active Ambulatory Problems    Diagnosis Date Noted  . Hyperlipidemia   . GERD (gastroesophageal reflux disease)   . Diabetes mellitus without complication ([place]) 95/63/8756  . Overweight (BMI 25.0-29.9) 10/17/2012  . Acute right hip pain 09/29/2015   Resolved Ambulatory Problems    Diagnosis Date Noted  . Diabetes mellitus type 2, uncontrolled ([place])    Past Medical History:  Diagnosis Date  . Diabetes mellitus   . GERD (gastroesophageal reflux disease)   . Hyperlipidemia     Constitutional Exam  Vitals: Blood pressure 127/62, pulse 100, temperature 98.5 F (36.9 C), resp. rate 16, height _0  (1.702 m), weight 160 lb (72.6 kg), last menstrual period 09/17/2015, SpO2 100 %. General appearance: Well nourished, well developed, and well hydrated. In no acute distress Calculated BMI/Body habitus: Body mass index is 25.06 kg/m. (25-29.9 kg/m2) Overweight - 20% higher  incidence of chronic pain Psych/Mental status: Alert and oriented x 3 (person, place, & time) Eyes: PERLA Respiratory: No evidence of acute respiratory distress  Cervical Spine Exam  Inspection: No masses, redness, or swelling Alignment: Symmetrical Functional ROM: ROM appears unrestricted Stability: No instability detected Muscle strength & Tone: Functionally intact  Sensory: Unimpaired Palpation: Non-contributory  Upper Extremity (UE) Exam    Side: Right upper extremity  Side: Left upper extremity  Inspection: No masses, redness, swelling, or asymmetry  Inspection: No masses, redness, swelling, or asymmetry  Functional ROM: ROM appears unrestricted  Functional ROM: ROM appears unrestricted  Muscle strength & Tone: Functionally intact  Muscle strength & Tone: Functionally intact  Sensory: Unimpaired  Sensory: Unimpaired  Palpation: Non-contributory  Palpation: Non-contributory   Thoracic Spine Exam  Inspection: No masses, redness, or swelling Alignment: Symmetrical Functional ROM: ROM appears unrestricted Stability: No instability detected Sensory: Unimpaired Muscle strength & Tone: Functionally intact Palpation: Non-contributory  Lumbar Spine Exam  Inspection: No masses, redness, or swelling Alignment: Symmetrical Functional ROM: ROM appears unrestricted Stability: No instability detected Muscle strength & Tone: Functionally intact Sensory: Unimpaired Palpation: Non-contributory Provocative Tests: Lumbar Hyperextension and rotation test: evaluation deferred today       Patrick's Maneuver: Positive       for right hip joint pain.  Gait & Posture Assessment  Ambulation: Unassisted Gait: Antalgic Posture: WNL   Lower Extremity Exam    Side: Right lower extremity  Side: Left lower extremity  Inspection: No masses, redness, swelling, or asymmetry  Inspection: No masses, redness, swelling, or asymmetry  Functional ROM: Decreased ROM of the right hip joint   Functional  ROM: ROM appears unrestricted  Muscle strength & Tone: Guarding  Muscle strength & Tone: Functionally intact  Sensory: Movement-associated pain  Sensory: Unimpaired  Palpation: Complains of area being tender to palpation  Palpation: Non-contributory   Assessment  Primary Diagnosis & Pertinent Problem List: The encounter diagnosis was Acute right hip pain.  Visit Diagnosis: 1. Acute right hip pain    Assessment: Acute right hip pain Today, 09/29/2015, we will proceed with a diagnostic intra-articular hip joint injection under fluoroscopic guidance and IV sedation for the purpose of determining whether or not we are dealing with an intra-articular problem or if this is coming from somewhere else.   Plan of Care  Lab-work & Procedure Ordered: Orders Placed This Encounter  Procedures  . HIP INJECTION   Interventional Therapies: Scheduled: Diagnostic right intra-articular hip joint injection under fluoroscopic guidance, no sedation, with local anesthetic and steroids, today. Considering: Nothing else at this time. PRN Procedures: None at this time.   Procedure:  Anesthesia, Analgesia, Anxiolysis:  Type: Diagnostic Intra-Articular Hip Injection Region:  Posterolateral hip joint area. Level: Lower pelvic and hip joint level. Laterality: Right-Sided  Indications: 1. Acute right hip pain    Pre-procedure Pain Score: 3/10 Reported level of pain is compatible with clinical observations Post-procedure Pain Score: 0-No pain  Type: Local Anesthesia Local Anesthetic: Lidocaine 1% Route: Infiltration ([place]/IM) IV Access: Declined Sedation: Declined  Indication(s): Analgesia     Pre-Procedure Assessment  See above.  Verification of the correct person, correct site (including marking of site), and correct procedure were performed and confirmed by the patient.  Consent: Secured. Under the influence of no sedatives a written informed consent was obtained, after having provided  information on the risks and possible complications. To fulfill our ethical and legal obligations, as recommended by the American Medical Association's Code of Ethics, we have provided information to the patient about our clinical impression; the nature and purpose of the treatment or procedure; the risks, benefits, and possible complications of the intervention; alternatives; the risk(s) and benefit(s) of the alternative treatment(s) or procedure(s); and the risk(s) and benefit(s) of doing nothing. The patient was provided information about the risks and  possible complications associated with the procedure. These include, but are not limited to, failure to achieve desired goals, infection, bleeding, organ or nerve damage, allergic reactions, paralysis, and death. In the case of intra- or periarticular procedures these may include, but are not limited to, failure to achieve desired goals, infection, bleeding (hemarthrosis), organ or nerve damage, allergic reactions, and death. In addition, the patient was informed that Medicine is not an exact science; therefore, there is also the possibility of unforeseen risks and possible complications that may result in a catastrophic outcome. The patient indicated having understood very clearly. We have given the patient no guarantees and we have made no promises. Enough time was given to the patient to ask questions, all of which were answered to the patient's satisfaction.  Consent Attestation: I, the ordering provider, attest that I have discussed with the patient the benefits, risks, side-effects, alternatives, likelihood of achieving goals, and potential problems during recovery for the procedure that I have provided informed consent.  Pre-Procedure Preparation: Safety Precautions: Allergies reviewed. Appropriate site, procedure, and patient were confirmed by following the Joint Commission's Universal Protocol (UP.01.01.01), in the form of a "Time Out". The patient  was asked to confirm marked site and procedure, before commencing. The patient was asked about blood thinners, or active infections, both of which were denied. Patient was assessed for positional comfort and all pressure points were checked before starting procedure. Allergies: She is allergic to tetanus toxoids.. Infection Control Precautions: Sterile technique used. Standard Universal Precautions were taken as recommended by the Department of [facility] for Disease Control and Prevention (CDC). Standard pre-surgical skin prep was conducted. Respiratory hygiene and cough etiquette was practiced. Hand hygiene observed. Safe injection practices and needle disposal techniques followed. SDV (single dose vial) medications used. Medications properly checked for expiration dates and contaminants. Personal protective equipment (PPE) used: Sterile Radiation-resistant gloves. Monitoring:  As per clinic protocol. Vitals:   09/29/15 1521 09/29/15 1557 09/29/15 1601 09/29/15 1606  BP: (!) 152/73 139/75 138/69 127/62  Pulse: 100 (!) 110 (!) 109 100  Resp: _0 Temp: 98.5 F (36.9 C)     SpO2: 100% 100% 100% 100%  Weight: 160 lb (72.6 kg)     Height: _1  (1.702 m)     Calculated BMI: Body mass index is 25.06 kg/m.  Description of Procedure Process:   Time-out: "Time-out" completed before starting procedure, as per protocol. Position: Left lateral decubitus Target Area: Superior aspect of the hip joint cavity, going thru the superior portion of the capsular ligament. Approach: Posterolateral approach. Area Prepped: Entire Posterolateral hip area. Prepping solution: ChloraPrep (2% chlorhexidine gluconate and 70% isopropyl alcohol) Safety Precautions: Aspiration looking for blood return was conducted prior to all injections. At no point did we inject any substances, as a needle was being advanced. No attempts were made at seeking any paresthesias. Safe injection practices and needle  disposal techniques used. Medications properly checked for expiration dates. SDV (single dose vial) medications used. Latex Allergy precautions taken. Contrast Allergy precautions taken.   Description of the Procedure: Protocol guidelines were followed. The patient was placed in position over the fluoroscopy table. The target area was identified and the area prepped in the usual manner. Skin & deeper tissues infiltrated with local anesthetic. Appropriate amount of time allowed to pass for local anesthetics to take effect. The procedure needles were then advanced to the target area. Proper needle placement secured. Negative aspiration confirmed. Solution injected in intermittent fashion, asking for  systemic symptoms every 0.5cc of injectate. The needles were then removed and the area cleansed, making sure to leave some of the prepping solution back to take advantage of its long term bactericidal properties. EBL: Minimal Materials & Medications Used:  Needle(s) Used: 22g - 3.5" Spinal Needle(s) Solution Injected: 0.2% PF-Ropivacaine (37m) + SDV-DepoMedrol 80 mg/ml (148m Medications Administered today: We administered ropivacaine (PF) 2 mg/ml (0.2%), lidocaine (PF), methylPREDNISolone acetate, and iopamidol.Please see chart orders for dosing details.  Imaging Guidance:   Type of Imaging Technique: Fluoroscopy Guidance (Non-spinal) Indication(s): Assistance in needle guidance and placement for procedures requiring needle placement in or near specific anatomical locations not easily accessible without such assistance. Exposure Time: Please see nurses notes. Contrast: Before injecting any contrast, we confirmed that the patient did not have an allergy to iodine, shellfish, or radiological contrast. Once satisfactory needle placement was completed at the desired level, radiological contrast was injected. Injection was conducted under continuous fluoroscopic guidance. Injection of contrast accomplished without  complications. See chart for type and volume of contrast used. Fluoroscopic Guidance: I was personally present in the fluoroscopy suite, where the patient was placed in position for the procedure, over the fluoroscopy-compatible table. Fluoroscopy was manipulated, using "Tunnel Vision Technique", to obtain the best possible view of the target area, on the affected side. Parallax error was corrected before commencing the procedure. A "direction-depth-direction" technique was used to introduce the needle under continuous pulsed fluoroscopic guidance. Once the target was reached, antero-posterior, oblique, and lateral fluoroscopic projection views were taken to confirm needle placement in all planes. Permanently recorded images stored by scanning into EMR. Interpretation: Intraoperative imaging interpretation by performing Physician. Adequate needle placement confirmed in AP, Lateral, & Oblique Views. Appropriate spread of contrast to desired area. No evidence of afferent or efferent intravascular uptake. No intrathecal or subarachnoid spread observed. Permanent images scanned into the patient's record.  Antibiotic Prophylaxis:  Indication(s): No indications identified. Type:  Antibiotics Given (last 72 hours)    None      Post-operative Assessment:   Complications: No immediate post-treatment complications were observed. Disposition: The patient was discharged home, once institutional criteria were met. Return to clinic in 2 weeks for follow-up evaluation and interpretation of results. The patient tolerated the entire procedure well. A repeat set of vitals were taken after the procedure and the patient was kept under observation following institutional policy, for this type of procedure. Post-procedural neurological assessment was performed, showing return to baseline, prior to discharge. The patient was provided with post-procedure discharge instructions, including a section on how to identify potential  problems. Should any problems arise concerning this procedure, the patient was given instructions to immediately contact us[place]at any time, without hesitation. In any case, we plan to contact the patient by telephone for a follow-up status report regarding this interventional procedure. Comments:  No additional relevant information.  Medications administered during this visit: We administered ropivacaine (PF) 2 mg/ml (0.2%), lidocaine (PF), methylPREDNISolone acetate, and iopamidol.  Prescriptions ordered during this visit: New Prescriptions   No medications on file    Requested PM Follow-up: Return in about 2 weeks (around 10/13/2015) for Post-Procedure evaluation.  Future Appointments Date Time Provider [place]9/27/2017 3:30 PM TeCrecencio McMD [facility] None    Primary Care Physician: TUCrecencio McMD [facility] Note by: FrKathlen BrunswickaDossie ArbourM.D, DABA, DABAPM, DABPM, DABIPP, FIPP  Disclaimer:  Medicine is not an exact science. The only guarantee in medicine is that nothing  is guaranteed. It is important to note that the decision to proceed with this intervention was based on the information collected from the patient. The Data and conclusions were drawn from the patient's questionnaire, the interview, and the physical examination. Because the information was provided in large part by the patient, it cannot be guaranteed that it has not been purposely or unconsciously manipulated. Every effort has been made to obtain as much relevant data as possible for this evaluation. It is important to note that the conclusions that lead to this procedure are derived in large part from the available data. Always take into account that the treatment will also be dependent on availability of resources and existing treatment guidelines, considered by other Pain Management Practitioners as being common knowledge and practice, at the time of the  intervention. For Medico-Legal purposes, it is also important to point out that variation in procedural techniques and pharmacological choices are the acceptable norm. The indications, contraindications, technique, and results of the above procedure should only be interpreted and judged by a Board-Certified Interventional Pain Specialist with extensive familiarity and expertise in the same exact procedure and technique. Attempts at providing opinions without similar or greater experience and expertise than that of the treating physician will be considered as inappropriate and unethical, and shall result in a formal complaint to the state medical board and applicable specialty societies.   Patient instructions provided during this appointment: Patient Instructions   GENERAL RISKS AND COMPLICATIONS  What are the risk, side effects and possible complications? Generally speaking, most procedures are safe.  However, with any procedure there are risks, side effects, and the possibility of complications.  The risks and complications are dependent upon the sites that are lesioned, or the type of nerve block to be performed.  The closer the procedure is to the spine, the more serious the risks are.  Great care is taken when placing the radio frequency needles, block needles or lesioning probes, but sometimes complications can occur. 1. Infection: Any time there is an injection through the skin, there is a risk of infection.  This is why sterile conditions are used for these blocks.  There are four possible types of infection. 1. Localized skin infection. 2. Central Nervous System Infection-This can be in the form of Meningitis, which can be deadly. 3. Epidural Infections-This can be in the form of an epidural abscess, which can cause pressure inside of the spine, causing compression of the spinal cord with subsequent paralysis. This would require an emergency surgery to decompress, and there are no guarantees  that the patient would recover from the paralysis. 4. Discitis-This is an infection of the intervertebral discs.  It occurs in about 1% of discography procedures.  It is difficult to treat and it may lead to surgery.        2. Pain: the needles have to go through skin and soft tissues, will cause soreness.       3. Damage to internal structures:  The nerves to be lesioned may be near blood vessels or    other nerves which can be potentially damaged.       4. Bleeding: Bleeding is more common if the patient is taking blood thinners such as  aspirin, Coumadin, Ticiid, Plavix, etc., or if he/she have some genetic predisposition  such as hemophilia. Bleeding into the spinal canal can cause compression of the spinal  cord with subsequent paralysis.  This would require an emergency surgery to  decompress and there are  no guarantees that the patient would recover from the  paralysis.       5. Pneumothorax:  Puncturing of a lung is a possibility, every time a needle is introduced in  the area of the chest or upper back.  Pneumothorax refers to free air around the  collapsed lung(s), inside of the thoracic cavity (chest cavity).  Another two possible  complications related to a similar event would include: Hemothorax and Chylothorax.   These are variations of the Pneumothorax, where instead of air around the collapsed  lung(s), you may have blood or chyle, respectively.       6. Spinal headaches: They may occur with any procedures in the area of the spine.       7. Persistent CSF (Cerebro-Spinal Fluid) leakage: This is a rare problem, but may occur  with prolonged intrathecal or epidural catheters either due to the formation of a fistulous  track or a dural tear.       8. Nerve damage: By working so close to the spinal cord, there is always a possibility of  nerve damage, which could be as serious as a permanent spinal cord injury with  paralysis.       9. Death:  Although rare, severe deadly allergic reactions  known as "Anaphylactic  reaction" can occur to any of the medications used.      10. Worsening of the symptoms:  We can always make thing worse.  What are the chances of something like this happening? Chances of any of this occuring are extremely low.  By statistics, you have more of a chance of getting killed in a motor vehicle accident: while driving to the hospital than any of the above occurring .  Nevertheless, you should be aware that they are possibilities.  In general, it is similar to taking a shower.  Everybody knows that you can slip, hit your head and get killed.  Does that mean that you should not shower again?  Nevertheless always keep in mind that statistics do not mean anything if you happen to be on the wrong side of them.  Even if a procedure has a 1 (one) in a 1,000,000 (million) chance of going wrong, it you happen to be that one..Also, keep in mind that by statistics, you have more of a chance of having something go wrong when taking medications.  Who should not have this procedure? If you are on a blood thinning medication (e.g. Coumadin, Plavix, see list of "Blood Thinners"), or if you have an active infection going on, you should not have the procedure.  If you are taking any blood thinners, please inform your physician.  How should I prepare for this procedure?  Do not eat or drink anything at least six hours prior to the procedure.  Bring a driver with you .  It cannot be a taxi.  Come accompanied by an adult that can drive you back, and that is strong enough to help you if your legs get weak or numb from the local anesthetic.  Take all of your medicines the morning of the procedure with just enough water to swallow them.  If you have diabetes, make sure that you are scheduled to have your procedure done first thing in the morning, whenever possible.  If you have diabetes, take only half of your insulin dose and notify our nurse that you have done so as soon as you  arrive at the clinic.  If you are diabetic, but  only take blood sugar pills (oral hypoglycemic), then do not take them on the morning of your procedure.  You may take them after you have had the procedure.  Do not take aspirin or any aspirin-containing medications, at least eleven (11) days prior to the procedure.  They may prolong bleeding.  Wear loose fitting clothing that may be easy to take off and that you would not mind if it got stained with Betadine or blood.  Do not wear any jewelry or perfume  Remove any nail coloring.  It will interfere with some of our monitoring equipment.  NOTE: Remember that this is not meant to be interpreted as a complete list of all possible complications.  Unforeseen problems may occur.  BLOOD THINNERS The following drugs contain aspirin or other products, which can cause increased bleeding during surgery and should not be taken for 2 weeks prior to and 1 week after surgery.  If you should need take something for relief of minor pain, you may take acetaminophen which is found in Tylenol,m Datril, Anacin-3 and Panadol. It is not blood thinner. The products listed below are.  Do not take any of the products listed below in addition to any listed on your instruction sheet.  A.P.C or A.P.C with Codeine Codeine Phosphate Capsules #3 Ibuprofen Ridaura  ABC compound Congesprin Imuran rimadil  Advil Cope Indocin Robaxisal  Alka-Seltzer Effervescent Pain Reliever and Antacid Coricidin or Coricidin-D  Indomethacin Rufen  Alka-Seltzer plus Cold Medicine Cosprin Ketoprofen S-A-C Tablets  Anacin Analgesic Tablets or Capsules Coumadin Korlgesic Salflex  Anacin Extra Strength Analgesic tablets or capsules CP-2 Tablets Lanoril Salicylate  Anaprox Cuprimine Capsules Levenox Salocol  Anexsia-D Dalteparin Magan Salsalate  Anodynos Darvon compound Magnesium Salicylate Sine-off  Ansaid Dasin Capsules Magsal Sodium Salicylate  Anturane Depen Capsules Marnal Soma  APF  Arthritis pain formula Dewitt's Pills Measurin Stanback  Argesic Dia-Gesic Meclofenamic Sulfinpyrazone  Arthritis Bayer Timed Release Aspirin Diclofenac Meclomen Sulindac  Arthritis pain formula Anacin Dicumarol Medipren Supac  Analgesic (Safety coated) Arthralgen Diffunasal Mefanamic Suprofen  Arthritis Strength Bufferin Dihydrocodeine Mepro Compound Suprol  Arthropan liquid Dopirydamole Methcarbomol with Aspirin Synalgos  ASA tablets/Enseals Disalcid Micrainin Tagament  Ascriptin Doan's Midol Talwin  Ascriptin A/D Dolene Mobidin Tanderil  Ascriptin Extra Strength Dolobid Moblgesic Ticlid  Ascriptin with Codeine Doloprin or Doloprin with Codeine Momentum Tolectin  Asperbuf Duoprin Mono-gesic Trendar  Aspergum Duradyne Motrin or Motrin IB Triminicin  Aspirin plain, buffered or enteric coated Durasal Myochrisine Trigesic  Aspirin Suppositories Easprin Nalfon Trillsate  Aspirin with Codeine Ecotrin Regular or Extra Strength Naprosyn Uracel  Atromid-S Efficin Naproxen Ursinus  Auranofin Capsules Elmiron Neocylate Vanquish  Axotal Emagrin Norgesic Verin  Azathioprine Empirin or Empirin with Codeine Normiflo Vitamin E  Azolid Emprazil Nuprin Voltaren  Bayer Aspirin plain, buffered or children's or timed BC Tablets or powders Encaprin Orgaran Warfarin Sodium  Buff-a-Comp Enoxaparin Orudis Zorpin  Buff-a-Comp with Codeine Equegesic Os-Cal-Gesic   Buffaprin Excedrin plain, buffered or Extra Strength Oxalid   Bufferin Arthritis Strength Feldene Oxphenbutazone   Bufferin plain or Extra Strength Feldene Capsules Oxycodone with Aspirin   Bufferin with Codeine Fenoprofen Fenoprofen Pabalate or Pabalate-SF   Buffets II Flogesic Panagesic   Buffinol plain or Extra Strength Florinal or Florinal with Codeine Panwarfarin   Buf-Tabs Flurbiprofen Penicillamine   Butalbital Compound Four-way cold tablets Penicillin   Butazolidin Fragmin Pepto-Bismol   Carbenicillin Geminisyn Percodan   Carna Arthritis  Reliever Geopen Persantine   Carprofen Gold's salt Persistin   Chloramphenicol Goody's Phenylbutazone  Chloromycetin Haltrain Piroxlcam   Clmetidine heparin Plaquenil   Cllnoril Hyco-pap Ponstel   Clofibrate Hydroxy chloroquine Propoxyphen         Before stopping any of these medications, be sure to consult the physician who ordered them.  Some, such as Coumadin (Warfarin) are ordered to prevent or treat serious conditions such as "deep thrombosis", "pumonary embolisms", and other heart problems.  The amount of time that you may need off of the medication may also vary with the medication and the reason for which you were taking it.  If you are taking any of these medications, please make sure you notify your pain physician before you undergo any procedures.         Pain Management Discharge Instructions  General Discharge Instructions :  If you need to reach your doctor call: Monday-Friday 8:00 am - 4:00 pm at [phone] or toll free [phone].  After clinic hours [phone] to have operator reach doctor.  Bring all of your medication bottles to all your appointments in the pain clinic.  To cancel or reschedule your appointment with Pain Management please remember to call 24 hours in advance to avoid a fee.  Refer to the educational materials which you have been given on: General Risks, I had my Procedure. Discharge Instructions, Post Sedation.  Post Procedure Instructions:  The drugs you were given will stay in your system until tomorrow, so for the next 24 hours you should not drive, make any legal decisions or drink any alcoholic beverages.  You may eat anything you prefer, but it is better to start with liquids then soups and crackers, and gradually work up to solid foods.  Please notify your doctor immediately if you have any unusual bleeding, trouble breathing or pain that is not related to your normal pain.  Depending on the type of procedure that was done,  some parts of your body may feel week and/or numb.  This usually clears up by tonight or the next day.  Walk with the use of an assistive device or accompanied by an adult for the 24 hours.  You may use ice on the affected area for the first 24 hours.  Put ice in a Ziploc bag and cover with a towel and place against area 15 minutes on 15 minutes off.  You may switch to heat after 24 hours.

## 2015-09-29 NOTE — Progress Notes (Signed)
Safety precautions to be maintained throughout the outpatient stay will include: orient to surroundings, keep bed in low position, maintain call bell within reach at all times, provide assistance with transfer out of bed and ambulation.  

## 2015-09-30 ENCOUNTER — Telehealth: Payer: Self-pay

## 2015-09-30 NOTE — Telephone Encounter (Signed)
Post  Procedure phone call.  Left message.

## 2015-11-09 ENCOUNTER — Ambulatory Visit (INDEPENDENT_AMBULATORY_CARE_PROVIDER_SITE_OTHER): Payer: 59 | Admitting: Internal Medicine

## 2015-11-09 ENCOUNTER — Encounter: Payer: Self-pay | Admitting: Internal Medicine

## 2015-11-09 ENCOUNTER — Ambulatory Visit (INDEPENDENT_AMBULATORY_CARE_PROVIDER_SITE_OTHER): Payer: 59

## 2015-11-09 VITALS — BP 148/84 | HR 100 | Temp 98.7°F | Resp 17 | Ht 67.0 in | Wt 175.2 lb

## 2015-11-09 DIAGNOSIS — D649 Anemia, unspecified: Secondary | ICD-10-CM | POA: Diagnosis not present

## 2015-11-09 DIAGNOSIS — E785 Hyperlipidemia, unspecified: Secondary | ICD-10-CM | POA: Diagnosis not present

## 2015-11-09 DIAGNOSIS — E119 Type 2 diabetes mellitus without complications: Secondary | ICD-10-CM | POA: Diagnosis not present

## 2015-11-09 DIAGNOSIS — R0609 Other forms of dyspnea: Secondary | ICD-10-CM

## 2015-11-09 DIAGNOSIS — R0689 Other abnormalities of breathing: Secondary | ICD-10-CM | POA: Diagnosis not present

## 2015-11-09 NOTE — Patient Instructions (Signed)
You can increase your Lantus dose to 20 units daily unitl you see Dr [name]  ECHO and cardiology referral to follow

## 2015-11-09 NOTE — Progress Notes (Signed)
Subjective:  Patient ID: Melissa Werner, female    DOB: February 19, 1966  Age: 50 y.o. MRN: [id number]  CC: The primary encounter diagnosis was Diabetes mellitus without complication (HCC). Diagnoses of Exertional dyspnea and Symptomatic anemia were also pertinent to this visit.  HPI Melissa Werner presents for follow up on Type 2 DM managed with Lantus , glipizide and metformin  This SmartLink has not been configured with any valid records.   a1c was 6.7 in April, down from  7.3     Sugars running in the 400's occasionally  For the  last 3 months,  fastings have been 200s. The loss of control started prior to injection of right hip by Ortho with steroids in August..   She Has resumed 4 days per week exercise over the last several weeks but is concerned about her exercise tolerance getting worse.  Short of breath walking up a flight of stairs.resting heart rate in low 100's.  Has had epsiodes of racing heart .  Peak rate 150 with exercise .  Low of 80's/   No orthopnea.      Father had CABG at 2170                    Facility-Administered Medications Prior to Visit  Medication Dose Route Frequency Provider Last Rate Last Dose  . iopamidol (ISOVUE-M) 41 % intrathecal injection 10 mL  10 mL Intra-articular Once Melissa MetzFrancisco Naveira, MD      . lidocaine (PF) (XYLOCAINE) 1 % injection 10 mL  10 mL Other Once Melissa MetzFrancisco Naveira, MD      . methylPREDNISolone acetate (DEPO-MEDROL) injection 80 mg  80 mg Other Once Melissa MetzFrancisco Naveira, MD      . ropivacaine (PF) 2 mg/ml (0.2%) (NAROPIN) epidural 9 mL  9 mL Other Once Melissa MetzFrancisco Naveira, MD       Outpatient Medications Prior to Visit  Medication Sig Dispense Refill  . glucose blood test strip Use once daily to check cbg   DM type 2 100 each 1  . Insulin Glargine (LANTUS SOLOSTAR) 100 UNIT/ML Solostar Pen Take 15 units subcutaneously each night    . metformin (FORTAMET) 1000 MG (OSM) 24 hr tablet Take 1,000 mg by mouth 2 (two) times daily with a meal.     .  NUVARING 0.12-0.015 MG/24HR vaginal ring     . omeprazole (PRILOSEC) 20 MG capsule Take 20 mg by mouth daily.    . ONE TOUCH ULTRA TEST test strip USE 3 TIMES DAILY TO CHECK BLOOD SUGAR. 100 each 2  . glipiZIDE (GLUCOTROL) 10 MG tablet Take 1 tablet (10 mg total) by mouth 2 (two) times daily before a meal. 60 tablet 1  . TRULICITY 1.5 MG/0.5ML SOPN   2    Review of Systems;  Patient denies headache, fevers, malaise, unintentional weight loss, skin rash, eye pain, sinus congestion and sinus pain, sore throat, dysphagia,  hemoptysis , cough, dyspnea, wheezing, chest pain, palpitations, orthopnea, edema, abdominal pain, nausea, melena, diarrhea, constipation, flank pain, dysuria, hematuria, urinary  Frequency, nocturia, numbness, tingling, seizures,  Focal weakness, Loss of consciousness,  Tremor, insomnia, depression, anxiety, and suicidal ideation.      Objective:  BP (!) 148/84 (BP Location: Right Arm, Patient Position: Sitting, Cuff Size: Normal)   Pulse 100   Temp 98.7 F (37.1 C) (Oral)   Resp 17   Ht 5\' 7"  (1.702 m)   Wt 175 lb 3.2 oz (79.5 kg)   LMP 10/24/2015  SpO2 98%   BMI 27.44 kg/m   BP Readings from Last 3 Encounters:  11/11/15 138/67  11/09/15 (!) 148/84  09/29/15 127/62    Wt Readings from Last 3 Encounters:  11/10/15 171 lb 14.4 oz (78 kg)  11/09/15 175 lb 3.2 oz (79.5 kg)  09/29/15 160 lb (72.6 kg)    General appearance: alert, cooperative and appears stated age Ears: normal TM's and external ear canals both ears Throat: lips, mucosa, and tongue normal; teeth and gums normal Neck: no adenopathy, no carotid bruit, supple, symmetrical, trachea midline and thyroid not enlarged, symmetric, no tenderness/mass/nodules Back: symmetric, no curvature. ROM normal. No CVA tenderness. Lungs: clear to auscultation bilaterally Heart: regular rate and rhythm, S1, S2 normal, no murmur, click, rub or gallop Abdomen: soft, non-tender; bowel sounds normal; no masses,  no  organomegaly Pulses: 2+ and symmetric Skin: Skin color, texture, turgor normal. No rashes or lesions Lymph nodes: Cervical, supraclavicular, and axillary nodes normal.  Lab Results  Component Value Date   HGBA1C 8.5 (H) 11/09/2015   HGBA1C 7.3 03/22/2015   HGBA1C 7.3 (A) 08/25/2014    Lab Results  Component Value Date   CREATININE 0.54 11/11/2015   CREATININE 0.68 11/10/2015   CREATININE 0.67 11/09/2015    Lab Results  Component Value Date   WBC 7.0 11/11/2015   HGB 9.3 (L) 11/11/2015   HCT 30.5 (L) 11/11/2015   PLT 394 11/11/2015   GLUCOSE 252 (H) 11/11/2015   CHOL 133 11/09/2015   TRIG 259.0 (H) 11/09/2015   HDL 45.20 11/09/2015   LDLDIRECT 58.0 11/09/2015   LDLCALC 51 11/25/2014   ALT 11 (L) 11/10/2015   AST 19 11/10/2015   NA 137 11/11/2015   K 4.1 11/11/2015   CL 108 11/11/2015   CREATININE 0.54 11/11/2015   BUN 11 11/11/2015   CO2 23 11/11/2015   TSH 3.02 11/25/2014   INR 1.00 11/10/2015   HGBA1C 8.5 (H) 11/09/2015   MICROALBUR <0.7 11/09/2015    Mm Screening Breast Tomo Bilateral  Result Date: 03/31/2015 CLINICAL DATA:  Screening. EXAM: DIGITAL SCREENING BILATERAL MAMMOGRAM WITH 3D TOMO WITH CAD COMPARISON:  Previous exam(s). ACR Breast Density Category c: The breast tissue is heterogeneously dense, which may obscure small masses. FINDINGS: There are no findings suspicious for malignancy. Images were processed with CAD. IMPRESSION: No mammographic evidence of malignancy. A result letter of this screening mammogram will be mailed directly to the patient. RECOMMENDATION: Screening mammogram in one year. (Code:SM-B-01Y) BI-RADS CATEGORY  1: Negative. Electronically Signed   By: Melissa Werner M.D.   On: 03/31/2015 16:01    Assessment & Plan:   Problem List Items Addressed This Visit    Diabetes mellitus without complication (HCC) - Primary    Loss of control by history started in August, when she stopped using Trulicity that was prescribed by Dr Melissa Werner.    Advised to increase Lantus to 20 units and follow up with Dr Melissa Werner to discuss resuming Trulicity  Lab Results  Component Value Date   HGBA1C 8.5 (H) 11/09/2015         Relevant Orders   Hemoglobin A1c (Completed)   Comprehensive metabolic panel (Completed)   Lipid panel (Completed)   Microalbumin / creatinine urine ratio (Completed)   Exertional dyspnea    With systolic  murmur noted on exam and baseline tachycardia .  CBC was Werner today and patient was noted to be profoundly anemic with Hgb 6.8        Relevant Orders  DG Chest 2 View (Completed)   CBC with Differential/Platelet (Completed)   ECHOCARDIOGRAM COMPLETE   Brain natriuretic peptide (Completed)   Symptomatic anemia    Patient was notified while out of town of hgb < 7 and rshe eturned home same day .  She was admitted to [facility] for symptomatic anemia and transufsed 2 units , repeat hgb 9.3 .  Iron stores were very low,  And she was guaiac negative per review of ER records.   Lab Results  Component Value Date   WBC 7.0 11/11/2015   HGB 9.3 (L) 11/11/2015   HCT 30.5 (L) 11/11/2015   MCV 60.5 (L) 11/11/2015   PLT 394 11/11/2015           Other Visit Diagnoses   None.     I have discontinued Ms. Drumwright's TRULICITY. I am also having her maintain her NUVARING, glipiZIDE, glucose blood, omeprazole, ONE TOUCH ULTRA TEST, metformin, and Insulin Glargine. We will continue to administer methylPREDNISolone acetate, iopamidol, lidocaine (PF), and ropivacaine (PF) 2 mg/ml (0.2%).  No orders of the defined types were placed in this encounter.   Medications Discontinued During This Encounter  Medication Reason  . TRULICITY 1.5 MG/0.5ML SOPN Patient Discharge    Follow-up: No Follow-up on file.   Sherlene Shams, MD

## 2015-11-09 NOTE — Assessment & Plan Note (Addendum)
With systolic  murmur noted on exam and baseline tachycardia .  CBC was done today and patient was noted to be profoundly anemic with Hgb 6.8

## 2015-11-10 ENCOUNTER — Encounter: Payer: Self-pay | Admitting: *Deleted

## 2015-11-10 ENCOUNTER — Telehealth: Payer: Self-pay | Admitting: *Deleted

## 2015-11-10 ENCOUNTER — Telehealth: Payer: Self-pay

## 2015-11-10 ENCOUNTER — Encounter: Payer: Self-pay | Admitting: Internal Medicine

## 2015-11-10 ENCOUNTER — Observation Stay
Admission: EM | Admit: 2015-11-10 | Discharge: 2015-11-11 | Disposition: A | Discharge status: Home / Self Care | Payer: 59 | Attending: Internal Medicine | Admitting: Internal Medicine

## 2015-11-10 DIAGNOSIS — E785 Hyperlipidemia, unspecified: Secondary | ICD-10-CM | POA: Diagnosis not present

## 2015-11-10 DIAGNOSIS — J90 Pleural effusion, not elsewhere classified: Secondary | ICD-10-CM | POA: Diagnosis not present

## 2015-11-10 DIAGNOSIS — Z79899 Other long term (current) drug therapy: Secondary | ICD-10-CM | POA: Diagnosis not present

## 2015-11-10 DIAGNOSIS — Z887 Allergy status to serum and vaccine status: Secondary | ICD-10-CM | POA: Insufficient documentation

## 2015-11-10 DIAGNOSIS — D509 Iron deficiency anemia, unspecified: Secondary | ICD-10-CM | POA: Diagnosis not present

## 2015-11-10 DIAGNOSIS — E119 Type 2 diabetes mellitus without complications: Secondary | ICD-10-CM | POA: Diagnosis not present

## 2015-11-10 DIAGNOSIS — Z794 Long term (current) use of insulin: Secondary | ICD-10-CM | POA: Insufficient documentation

## 2015-11-10 DIAGNOSIS — D649 Anemia, unspecified: Secondary | ICD-10-CM | POA: Diagnosis not present

## 2015-11-10 DIAGNOSIS — K219 Gastro-esophageal reflux disease without esophagitis: Secondary | ICD-10-CM | POA: Insufficient documentation

## 2015-11-10 DIAGNOSIS — R5383 Other fatigue: Secondary | ICD-10-CM | POA: Diagnosis not present

## 2015-11-10 LAB — CBC WITH DIFFERENTIAL/PLATELET
BASOS ABS: 0.3 10*3/uL — AB (ref 0.0–0.1)
Basophils Absolute: 0.2 10*3/uL — ABNORMAL HIGH (ref 0–0.1)
Basophils Relative: 3.8 % — ABNORMAL HIGH (ref 0.0–3.0)
Basophils Relative: 2 %
Eosinophils Absolute: 0.2 10*3/uL (ref 0.0–0.7)
Eosinophils Absolute: 0.1 10*3/uL (ref 0–0.7)
Eosinophils Relative: 2.1 % (ref 0.0–5.0)
HCT: 23.6 % — CL (ref 36.0–46.0)
HCT: 25.8 % — ABNORMAL LOW (ref 35.0–47.0)
Hemoglobin: 7.4 g/dL — ABNORMAL LOW (ref 12.0–16.0)
LYMPHS ABS: 2.8 10*3/uL (ref 0.7–4.0)
LYMPHS ABS: 4.3 10*3/uL — AB (ref 1.0–3.6)
Lymphocytes Relative: 38.5 % (ref 12.0–46.0)
MCH: 15.7 pg — AB (ref 26.0–34.0)
MCHC: 28.9 g/dL — ABNORMAL LOW (ref 30.0–36.0)
MCHC: 28.9 g/dL — AB (ref 32.0–36.0)
MCV: 55.5 fl — AB (ref 78.0–100.0)
MCV: 54.4 fL — AB (ref 80.0–100.0)
MONO ABS: 0.7 10*3/uL (ref 0.1–1.0)
MONO ABS: 0.9 10*3/uL (ref 0.2–0.9)
MONOS PCT: 9.5 % (ref 3.0–12.0)
NEUTROS PCT: 46.1 % (ref 43.0–77.0)
Neutro Abs: 3.4 10*3/uL (ref 1.4–7.7)
Neutro Abs: 3.3 10*3/uL (ref 1.4–6.5)
Neutrophils Relative %: 38 %
PLATELETS: 513 10*3/uL — AB (ref 150–440)
Platelets: 488 10*3/uL — ABNORMAL HIGH (ref 150.0–400.0)
RBC: 4.25 Mil/uL (ref 3.87–5.11)
RBC: 4.74 MIL/uL (ref 3.80–5.20)
RDW: 18.8 % — ABNORMAL HIGH (ref 11.5–15.5)
RDW: 19.1 % — AB (ref 11.5–14.5)
WBC: 7.4 10*3/uL (ref 4.0–10.5)
WBC: 8.8 10*3/uL (ref 3.6–11.0)

## 2015-11-10 LAB — COMPREHENSIVE METABOLIC PANEL
ALBUMIN: 3.8 g/dL (ref 3.5–5.2)
ALBUMIN: 3.9 g/dL (ref 3.5–5.0)
ALK PHOS: 44 U/L (ref 39–117)
ALK PHOS: 48 U/L (ref 38–126)
ALT: 9 U/L (ref 0–35)
ALT: 11 U/L — ABNORMAL LOW (ref 14–54)
ANION GAP: 10 (ref 5–15)
AST: 11 U/L (ref 0–37)
AST: 19 U/L (ref 15–41)
BILIRUBIN TOTAL: 0.2 mg/dL (ref 0.2–1.2)
BUN: 9 mg/dL (ref 6–23)
BUN: 10 mg/dL (ref 6–20)
CHLORIDE: 106 mmol/L (ref 101–111)
CO2: 22 mEq/L (ref 19–32)
CO2: 22 mmol/L (ref 22–32)
CREATININE: 0.67 mg/dL (ref 0.40–1.20)
Calcium: 8.5 mg/dL (ref 8.4–10.5)
Calcium: 9.1 mg/dL (ref 8.9–10.3)
Chloride: 105 mEq/L (ref 96–112)
Creatinine, Ser: 0.68 mg/dL (ref 0.44–1.00)
GFR calc non Af Amer: >60 mL/min (ref >60)
GFR: 99.15 mL/min (ref >60.00)
GLUCOSE: 229 mg/dL — AB (ref 70–99)
GLUCOSE: 239 mg/dL — AB (ref 65–99)
POTASSIUM: 3.8 mmol/L (ref 3.5–5.1)
Potassium: 4.1 mEq/L (ref 3.5–5.1)
SODIUM: 135 meq/L (ref 135–145)
SODIUM: 138 mmol/L (ref 135–145)
TOTAL PROTEIN: 6.7 g/dL (ref 6.0–8.3)
Total Bilirubin: 0.2 mg/dL — ABNORMAL LOW (ref 0.3–1.2)
Total Protein: 7.4 g/dL (ref 6.5–8.1)

## 2015-11-10 LAB — IRON AND TIBC
Iron: 6 ug/dL — ABNORMAL LOW (ref 28–170)
SATURATION RATIOS: 1 % — AB (ref 10.4–31.8)
TIBC: 557 ug/dL — ABNORMAL HIGH (ref 250–450)
UIBC: 551 ug/dL

## 2015-11-10 LAB — LIPID PANEL
Cholesterol: 133 mg/dL (ref 0–200)
HDL: 45.2 mg/dL (ref >39.00)
NonHDL: 87.53
Total CHOL/HDL Ratio: 3
Triglycerides: 259 mg/dL — ABNORMAL HIGH (ref 0.0–149.0)
VLDL: 51.8 mg/dL — ABNORMAL HIGH (ref 0.0–40.0)

## 2015-11-10 LAB — APTT: APTT: 25 s (ref 24–36)

## 2015-11-10 LAB — URINALYSIS COMPLETE WITH MICROSCOPIC (ARMC ONLY)
BILIRUBIN URINE: NEGATIVE
HGB URINE DIPSTICK: NEGATIVE
LEUKOCYTES UA: NEGATIVE
NITRITE: NEGATIVE
PH: 5 (ref 5.0–8.0)
Protein, ur: NEGATIVE mg/dL
RBC / HPF: NONE SEEN RBC/hpf (ref 0–5)
SPECIFIC GRAVITY, URINE: 1.018 (ref 1.005–1.030)

## 2015-11-10 LAB — TROPONIN I: Troponin I: <0.03 ng/mL (ref <0.03)

## 2015-11-10 LAB — MICROALBUMIN / CREATININE URINE RATIO
CREATININE, U: 139.3 mg/dL
Microalb Creat Ratio: 0.5 mg/g (ref 0.0–30.0)

## 2015-11-10 LAB — MAGNESIUM: MAGNESIUM: 1.7 mg/dL (ref 1.7–2.4)

## 2015-11-10 LAB — GLUCOSE, CAPILLARY: GLUCOSE-CAPILLARY: 195 mg/dL — AB (ref 65–99)

## 2015-11-10 LAB — PROTIME-INR
INR: 1
Prothrombin Time: 13.2 seconds (ref 11.4–15.2)

## 2015-11-10 LAB — HEMOGLOBIN A1C: HEMOGLOBIN A1C: 8.5 % — AB (ref 4.6–6.5)

## 2015-11-10 LAB — BRAIN NATRIURETIC PEPTIDE: Brain Natriuretic Peptide: 79.1 pg/mL (ref <100)

## 2015-11-10 LAB — PREPARE RBC (CROSSMATCH)

## 2015-11-10 LAB — FERRITIN: FERRITIN: 3 ng/mL — AB (ref 11–307)

## 2015-11-10 LAB — LDL CHOLESTEROL, DIRECT: LDL DIRECT: 58 mg/dL

## 2015-11-10 MED ORDER — SODIUM CHLORIDE 0.9 % IV SOLN
Freq: Once | INTRAVENOUS | Status: AC
  Administered 2015-11-11: 01:00:00 via INTRAVENOUS

## 2015-11-10 MED ORDER — ZOLPIDEM TARTRATE 5 MG PO TABS: 5.0000 mg | ORAL_TABLET | Freq: Every evening | ORAL | Status: DC | PRN

## 2015-11-10 MED ORDER — ACETAMINOPHEN 325 MG PO TABS: 650.0000 mg | ORAL_TABLET | Freq: Four times a day (QID) | ORAL | Status: DC | PRN

## 2015-11-10 MED ORDER — ONDANSETRON HCL 4 MG/2ML IJ SOLN: 4.0000 mg | Freq: Four times a day (QID) | INTRAMUSCULAR | Status: DC | PRN

## 2015-11-10 MED ORDER — SODIUM CHLORIDE 0.9 % IV SOLN: INTRAVENOUS | Status: DC

## 2015-11-10 MED ORDER — SENNOSIDES-DOCUSATE SODIUM 8.6-50 MG PO TABS: 1.0000 | ORAL_TABLET | Freq: Every evening | ORAL | Status: DC | PRN

## 2015-11-10 MED ORDER — HYDROCODONE-ACETAMINOPHEN 5-325 MG PO TABS: 1.0000 | ORAL_TABLET | ORAL | Status: DC | PRN

## 2015-11-10 MED ORDER — MAGNESIUM CITRATE PO SOLN: 1.0000 | Freq: Once | ORAL | Status: DC | PRN

## 2015-11-10 MED ORDER — ENOXAPARIN SODIUM 40 MG/0.4ML ~~LOC~~ SOLN
40.0000 mg | Freq: Every day | SUBCUTANEOUS | Status: DC
  Filled 2015-11-10: qty 0.4

## 2015-11-10 MED ORDER — ONDANSETRON HCL 4 MG PO TABS: 4.0000 mg | ORAL_TABLET | Freq: Four times a day (QID) | ORAL | Status: DC | PRN

## 2015-11-10 MED ORDER — INSULIN ASPART 100 UNIT/ML ~~LOC~~ SOLN
0.0000 [IU] | Freq: Every day | SUBCUTANEOUS | Status: DC
Start: 2015-11-10 — End: 2015-11-11

## 2015-11-10 MED ORDER — SODIUM CHLORIDE 0.9% FLUSH
3.0000 mL | Freq: Two times a day (BID) | INTRAVENOUS | Status: DC
  Administered 2015-11-11 (×2): 3 mL via INTRAVENOUS

## 2015-11-10 MED ORDER — SODIUM CHLORIDE 0.9 % IV SOLN: 10.0000 mL/h | Freq: Once | INTRAVENOUS | Status: DC

## 2015-11-10 MED ORDER — BISACODYL 5 MG PO TBEC: 5.0000 mg | DELAYED_RELEASE_TABLET | Freq: Every day | ORAL | Status: DC | PRN

## 2015-11-10 MED ORDER — INSULIN ASPART 100 UNIT/ML ~~LOC~~ SOLN
0.0000 [IU] | Freq: Three times a day (TID) | SUBCUTANEOUS | Status: DC
  Administered 2015-11-11: 12:00:00 5 [IU] via SUBCUTANEOUS
  Administered 2015-11-11: 8 [IU] via SUBCUTANEOUS
  Filled 2015-11-10: qty 5
  Filled 2015-11-10: qty 8

## 2015-11-10 MED ORDER — ACETAMINOPHEN 650 MG RE SUPP: 650.0000 mg | Freq: Four times a day (QID) | RECTAL | Status: DC | PRN

## 2015-11-10 NOTE — Telephone Encounter (Signed)
[facility] CALLED CRITICAL LAB RESULTS  HGB 6.8  Please advise,

## 2015-11-10 NOTE — ED Triage Notes (Signed)
Pt sent to er for eval of low hgb.  Pt seen by dr Darrick Huntsmantullo yesterday and was called today and told to come to er.  Denies dizziness.  No n/v.  Pt has diarrhea.  Pt alert.

## 2015-11-10 NOTE — ED Provider Notes (Signed)
[facility] Emergency Department Provider Note  ____________________________________________   None    (approximate)  I have reviewed the triage vital signs and the nursing notes.    HISTORY  Chief Complaint Abnormal Lab    HPI Melissa Werner is a 50 y.o. female with a history of insulin-dependent diabetes but he is generally healthy and jogs a couple miles several times a weekwho presents for evaluation of low hemoglobin.  She said that for months she has had gradually worsening fatigue and shortness of breath with exertion.  Even though she jogs regularly she has felt increasingly limited with her ability to do so.  She states that even walking upstairs now will make her tired out and she will have to rest.  She denies chest pain, fever/chills, nausea, vomiting, abdominal pain.  She has had some recent diarrhea but she attributed that to her diabetes medications.  She denies seeing any blood in her stool or her urine and has not had any black stools.  She has no history of anemia and no history of GI bleeding.  She said that she feels fine at rest but with even slight exertion she is out of breath which is why she went to her primary care doctor within the last couple of days.  Dr. Darrick Huntsman sent off lab work and then contacted her today about her hemoglobin that was less than 7 and told her to come to the emergency department.  Overall her symptoms have been gradual in onset but are now moderate to severe.  Nothing seems to be making them better and they are getting worse over time.   Past Medical History:  Diagnosis Date  . Diabetes mellitus   . GERD (gastroesophageal reflux disease)   . Hyperlipidemia     Patient Active Problem List   Diagnosis Date Noted  . Symptomatic anemia 11/10/2015  . Exertional dyspnea 11/09/2015  . Acute right hip pain 09/29/2015  . Overweight (BMI 25.0-29.9) 10/17/2012  . Diabetes mellitus without complication (HCC) 03/22/2011    . Hyperlipidemia   . GERD (gastroesophageal reflux disease)     No past surgical history on file.  Prior to Admission medications   Medication Sig Start Date End Date Taking? Authorizing Provider  glipiZIDE (GLUCOTROL) 10 MG tablet Take 10 mg by mouth 2 (two) times daily before a meal.   Yes Historical Provider, MD  glucose blood test strip Use once daily to check cbg   DM type 2 10/06/13  Yes Sherlene Shams, MD  Insulin Glargine (LANTUS SOLOSTAR) 100 UNIT/ML Solostar Pen Take 15 units subcutaneously each night 01/31/15  Yes Historical Provider, MD  metformin (FORTAMET) 1000 MG (OSM) 24 hr tablet Take 1,000 mg by mouth 2 (two) times daily with a meal.  12/13/14 12/13/15 Yes Historical Provider, MD  NUVARING 0.12-0.015 MG/24HR vaginal ring  02/19/11  Yes Historical Provider, MD  omeprazole (PRILOSEC) 20 MG capsule Take 20 mg by mouth daily.   Yes Historical Provider, MD  ONE TOUCH ULTRA TEST test strip USE 3 TIMES DAILY TO CHECK BLOOD SUGAR. 10/04/14  Yes Sherlene Shams, MD  glipiZIDE (GLUCOTROL) 10 MG tablet Take 1 tablet (10 mg total) by mouth 2 (two) times daily before a meal. 12/29/12 12/29/13  Sherlene Shams, MD    Allergies Tetanus toxoids  Family History  Problem Relation Age of Onset  . Hyperlipidemia Father   . Breast cancer Maternal Aunt 50  . Cancer Maternal Aunt  breast    Social History Social History  Substance Use Topics  . Smoking status: Never Smoker  . Smokeless tobacco: Never Used  . Alcohol use No    Review of Systems Constitutional: No fever/chills Eyes: No visual changes. ENT: No sore throat. Cardiovascular: Denies chest pain. Respiratory: SOB w/ exertion Gastrointestinal: No abdominal pain.  No nausea, no vomiting.  Recent diarrhea.  No constipation. Genitourinary: Negative for dysuria. Musculoskeletal: Negative for back pain. Skin: Negative for rash. Neurological: Negative for headaches, focal weakness or numbness.  10-point ROS otherwise  negative.  ____________________________________________   PHYSICAL EXAM:  VITAL SIGNS: ED Triage Vitals  Enc Vitals Group     BP 11/10/15 1955 (!) 161/78     Pulse Rate 11/10/15 1955 (!) 109     Resp 11/10/15 1955 20     Temp 11/10/15 1955 98.6 F (37 C)     Temp Source 11/10/15 1955 Oral     SpO2 11/10/15 1955 100 %     Weight 11/10/15 1957 165 lb (74.8 kg)     Height 11/10/15 1957 5\' 7"  (1.702 m)     Head Circumference --      Peak Flow --      Pain Score 11/10/15 1957 0     Pain Loc --      Pain Edu? --      Excl. in GC? --     Constitutional: Alert and oriented. Well appearing and in no acute distress. Eyes: Conjunctivae are normal. PERRL. EOMI. Head: Atraumatic. Nose: No congestion/rhinnorhea. Mouth/Throat: Mucous membranes are moist.  Oropharynx non-erythematous. Neck: No stridor.  No meningeal signs.   Cardiovascular: Normal rate, regular rhythm. Good peripheral circulation. Grossly normal heart sounds. Respiratory: Normal respiratory effort.  No retractions. Lungs CTAB. Gastrointestinal: Soft and nontender. No distention.  Rectal:  Normal external rectal exam.  Non-tender.  Heme negative, QC passed.  RN chaperone present. Musculoskeletal: No lower extremity tenderness nor edema. No gross deformities of extremities. Neurologic:  Normal speech and language. No gross focal neurologic deficits are appreciated.  Skin:  Skin is warm, dry and intact. No rash noted. Psychiatric: Mood and affect are normal. Speech and behavior are normal.  ____________________________________________   LABS (all labs ordered are listed, but only abnormal results are displayed)  Labs Reviewed  CBC WITH DIFFERENTIAL/PLATELET - Abnormal; Notable for the following:       Result Value   Hemoglobin 7.4 (*)    HCT 25.8 (*)    MCV 54.4 (*)    MCH 15.7 (*)    MCHC 28.9 (*)    RDW 19.1 (*)    Platelets 513 (*)    Lymphs Abs 4.3 (*)    Basophils Absolute 0.2 (*)    All other components  within normal limits  COMPREHENSIVE METABOLIC PANEL - Abnormal; Notable for the following:    Glucose, Bld 239 (*)    ALT 11 (*)    Total Bilirubin 0.2 (*)    All other components within normal limits  URINALYSIS COMPLETEWITH MICROSCOPIC ([facility] ONLY) - Abnormal; Notable for the following:    Color, Urine YELLOW (*)    APPearance CLEAR (*)    Glucose, UA >500 (*)    Ketones, ur TRACE (*)    Bacteria, UA RARE (*)    Squamous Epithelial / LPF 0-5 (*)    All other components within normal limits  IRON AND TIBC - Abnormal; Notable for the following:    Iron 6 (*)    TIBC  557 (*)    Saturation Ratios 1 (*)    All other components within normal limits  FERRITIN - Abnormal; Notable for the following:    Ferritin 3 (*)    All other components within normal limits  GLUCOSE, CAPILLARY - Abnormal; Notable for the following:    Glucose-Capillary 195 (*)    All other components within normal limits  PROTIME-INR  APTT  TROPONIN I  MAGNESIUM  VITAMIN B12  FOLATE RBC  BASIC METABOLIC PANEL  CBC  FOLATE  TYPE AND SCREEN  PREPARE RBC (CROSSMATCH)  ABO/RH  PREPARE RBC (CROSSMATCH)   ____________________________________________  EKG  ED ECG REPORT I, Collie Wernick, the attending physician, personally viewed and interpreted this ECG.  Date: 11/11/2015 EKG Time: 21:06 Rate: 107 Rhythm: sinus tachycardia QRS Axis: normal Intervals: normal ST/T Wave abnormalities: normal Conduction Disturbances: none Narrative Interpretation: unremarkable  ____________________________________________  RADIOLOGY   No results found.  ____________________________________________   PROCEDURES  Procedure(s) performed:   Procedures   Critical Care performed: No ____________________________________________   INITIAL IMPRESSION / ASSESSMENT AND PLAN / ED COURSE  Pertinent labs & imaging results that were available during my care of the patient were reviewed by me and considered in my  medical decision making (see chart for details).  MVC is 55.5, strongly suspect iron-deficient anemia.  Denies ETOH, tobacco, drug use.  Checking iron studies (iron, TIBC, ferratin, vitamin B12, folate RBC) as well as standard ED labs.  Will check rectal and determine need for transfusion.   Clinical Course  Comment By Time  Hemoccult negative.  Patient has a resting heart rate of 120.  Symptomatic anemia and would benefit from crossmatched blood after she gets to the floor (blood bank will only give O-neg in the ED).  Paged hospitalist to discuss. Loleta Rose, MD 09/14 2119    ____________________________________________  FINAL CLINICAL IMPRESSION(S) / ED DIAGNOSES  Final diagnoses:  Symptomatic anemia     MEDICATIONS GIVEN DURING THIS VISIT:  Medications  0.9 %  sodium chloride infusion (not administered)  enoxaparin (LOVENOX) injection 40 mg (not administered)  0.9 %  sodium chloride infusion (not administered)  acetaminophen (TYLENOL) tablet 650 mg (not administered)    Or  acetaminophen (TYLENOL) suppository 650 mg (not administered)  HYDROcodone-acetaminophen (NORCO/VICODIN) 5-325 MG per tablet 1-2 tablet (not administered)  zolpidem (AMBIEN) tablet 5 mg (not administered)  senna-docusate (Senokot-S) tablet 1 tablet (not administered)  bisacodyl (DULCOLAX) EC tablet 5 mg (not administered)  magnesium citrate solution 1 Bottle (not administered)  ondansetron (ZOFRAN) tablet 4 mg (not administered)    Or  ondansetron (ZOFRAN) injection 4 mg (not administered)  sodium chloride flush (NS) 0.9 % injection 3 mL (not administered)  0.9 %  sodium chloride infusion (not administered)  insulin aspart (novoLOG) injection 0-15 Units (not administered)  insulin aspart (novoLOG) injection 0-5 Units (not administered)     NEW OUTPATIENT MEDICATIONS STARTED DURING THIS VISIT:  Current Discharge Medication List      Current Discharge Medication List      Current Discharge  Medication List       Note:  This document was prepared using Dragon voice recognition software and may include unintentional dictation errors.    Loleta Rose, MD 11/11/15 0010

## 2015-11-10 NOTE — ED Notes (Signed)
RN attempted to call report to floor and was told RN could not take report. THis RN then asked if charge RN could take report and was told they could not either. THis RN will attempt to call report again in 10  Minutes.

## 2015-11-10 NOTE — Telephone Encounter (Signed)
Attempted to reach patient at work, not working today.  Attempted cell phone/home number and left a message to return our call ASAP!

## 2015-11-10 NOTE — Telephone Encounter (Signed)
Left a message on husband's cell also.

## 2015-11-10 NOTE — Telephone Encounter (Signed)
[facility] called in a critical lab report

## 2015-11-10 NOTE — Telephone Encounter (Signed)
lmov to schedule echo  °

## 2015-11-10 NOTE — H&P (Signed)
[facility] - [place] @ [facility] Admission History and Physical [employer], D.O.  ---------------------------------------------------------------------------------------------------------------------   PATIENT NAME: Melissa Werner MR#: [id number] DATE OF BIRTH: 20-Nov-1965 DATE OF ADMISSION: 11/10/2015 PRIMARY CARE PHYSICIAN: Sherlene Shams, MD  REQUESTING/REFERRING PHYSICIAN: ED Dr. York Cerise  CHIEF COMPLAINT: Chief Complaint  Patient presents with  . Abnormal Lab    HISTORY OF PRESENT ILLNESS: Melissa Werner is a 50 y.o. female with a known history of Hyperlipidemia, GERD, insulin-dependent diabetes was in a usual state of health until the past several months when she reports developing gradually worsening dyspnea on exertion and chronic fatigue. She reports that she exercises regularly but has been having increasing difficulty with her workouts due to fatigue and shortness of breath. She was seeing her primary care doctor for evaluation of her fatigue, routine blood work was drawn and she was informed by her primary care provider that she has anemia. Patient was referred to the emergency department for further evaluation. Patient denies any bruising, bleeding, abdominal pain, hematuria, hematochezia or melena.  Otherwise there has been no change in status. Patient has been taking medication as prescribed and there has been no recent change in medication or diet.  There has been no recent illness, travel or sick contacts.    Patient denies fevers/chills, weakness, dizziness, chest pain, shortness of breath, N/V/C/D, abdominal pain, dysuria/frequency, changes in mental status.    PAST MEDICAL HISTORY: Past Medical History:  Diagnosis Date  . Diabetes mellitus   . GERD (gastroesophageal reflux disease)   . Hyperlipidemia       PAST SURGICAL HISTORY: No past surgical history on file.    SOCIAL HISTORY: Social History  Substance Use Topics  . Smoking status: Never  Smoker  . Smokeless tobacco: Never Used  . Alcohol use No      FAMILY HISTORY: Family History  Problem Relation Age of Onset  . Hyperlipidemia Father   . Breast cancer Maternal Aunt 50  . Cancer Maternal Aunt     breast     MEDICATIONS AT HOME: Prior to Admission medications   Medication Sig Start Date End Date Taking? Authorizing Provider  glipiZIDE (GLUCOTROL) 10 MG tablet Take 10 mg by mouth 2 (two) times daily before a meal.   Yes Historical Provider, MD  glucose blood test strip Use once daily to check cbg   DM type 2 10/06/13  Yes Sherlene Shams, MD  Insulin Glargine (LANTUS SOLOSTAR) 100 UNIT/ML Solostar Pen Take 15 units subcutaneously each night 01/31/15  Yes Historical Provider, MD  metformin (FORTAMET) 1000 MG (OSM) 24 hr tablet Take 1,000 mg by mouth 2 (two) times daily with a meal.  12/13/14 12/13/15 Yes Historical Provider, MD  NUVARING 0.12-0.015 MG/24HR vaginal ring  02/19/11  Yes Historical Provider, MD  omeprazole (PRILOSEC) 20 MG capsule Take 20 mg by mouth daily.   Yes Historical Provider, MD  ONE TOUCH ULTRA TEST test strip USE 3 TIMES DAILY TO CHECK BLOOD SUGAR. 10/04/14  Yes Sherlene Shams, MD  glipiZIDE (GLUCOTROL) 10 MG tablet Take 1 tablet (10 mg total) by mouth 2 (two) times daily before a meal. 12/29/12 12/29/13  Sherlene Shams, MD      DRUG ALLERGIES: Allergies  Allergen Reactions  . Tetanus Toxoids     high fevers, systemic illness      REVIEW OF SYSTEMS: CONSTITUTIONAL: No fever/chills,Positive fatigue, negative weakness, weight gain/loss, headache EYES: No blurry or double vision. ENT: No tinnitus, postnasal drip, redness or soreness of the  oropharynx. RESPIRATORY: No cough, wheeze, hemoptysis positive dyspnea on exertion CARDIOVASCULAR: No chest pain, orthopnea, palpitations, syncope. GASTROINTESTINAL: No nausea, vomiting, constipation, diarrhea, abdominal pain, hematemesis, melena or hematochezia. GENITOURINARY: No dysuria or  hematuria. ENDOCRINE: No polyuria or nocturia. No heat or cold intolerance. HEMATOLOGY: No anemia, bruising, bleeding. INTEGUMENTARY: No rashes, ulcers, lesions. MUSCULOSKELETAL: No arthritis, swelling, gout. NEUROLOGIC: No numbness, tingling, weakness or ataxia. No seizure-type activity. PSYCHIATRIC: No anxiety, depression, insomnia.  PHYSICAL EXAMINATION: VITAL SIGNS: Blood pressure (!) 148/78, pulse (!) 102, temperature 98.4 F (36.9 C), temperature source Oral, resp. rate 18, height 5\' 7"  (1.702 m), weight 78 kg (171 lb 14.4 oz), last menstrual period 10/24/2015, SpO2 100 %.  GENERAL: 50 y.o.-year-old pale white female patient, well-developed, well-nourished lying in the bed in no acute distress.  Pleasant and cooperative.   HEENT: Head atraumatic, normocephalic. Pupils equal, round, reactive to light and accommodation. No scleral icterus. Extraocular muscles intact. Nares are patent. Oropharynx is clear. Mucus membranes moist. NECK: Supple, full range of motion. No JVD, no bruit heard. No thyroid enlargement, no tenderness, no cervical lymphadenopathy. CHEST: Normal breath sounds bilaterally. No wheezing, rales, rhonchi or crackles. No use of accessory muscles of respiration.  No reproducible chest wall tenderness.  CARDIOVASCULAR: S1, S2 normal. No murmurs, rubs, or gallops. Cap refill <2 seconds. ABDOMEN: Soft, nontender, nondistended. No rebound, guarding, rigidity. Normoactive bowel sounds present in all four quadrants. No organomegaly or mass. EXTREMITIES: Full range of motion. No pedal edema, cyanosis, or clubbing. NEUROLOGIC: Cranial nerves II through XII are grossly intact with no focal sensorimotor deficit. Muscle strength 5/5 in all extremities. Sensation intact. Gait not checked. PSYCHIATRIC: The patient is alert and oriented x 3. Normal affect, mood, thought content. SKIN: Warm, dry, and intact without obvious rash, lesion, or ulcer.  LABORATORY PANEL:  CBC  Recent  Labs Lab 11/10/15 2032  WBC 8.8  HGB 7.4*  HCT 25.8*  PLT 513*   ----------------------------------------------------------------------------------------------------------------- Chemistries  Recent Labs Lab 11/10/15 2032 11/10/15 2055  NA 138  --   K 3.8  --   CL 106  --   CO2 22  --   GLUCOSE 239*  --   BUN 10  --   CREATININE 0.68  --   CALCIUM 9.1  --   MG  --  1.7  AST 19  --   ALT 11*  --   ALKPHOS 48  --   BILITOT 0.2*  --    ------------------------------------------------------------------------------------------------------------------ Cardiac Enzymes  Recent Labs Lab 11/10/15 2055  TROPONINI <0.03   ------------------------------------------------------------------------------------------------------------------  RADIOLOGY: Dg Chest 2 View  Result Date: 11/10/2015 CLINICAL DATA:  New onset exertional dyspnea, history diabetes mellitus, GERD EXAM: CHEST  2 VIEW COMPARISON:  None FINDINGS: Normal heart size, mediastinal contours, and pulmonary vascularity. Minimal peribronchial thickening. Lungs otherwise clear. Pleural effusion pneumothorax. Bones unremarkable. IMPRESSION: Minimal bronchitic changes without infiltrate. Electronically Signed   By: Ulyses SouthwardMark  Boles M.D.   On: 11/10/2015 08:20    EKG: Pending  IMPRESSION AND PLAN:  This is a 50 y.o. female with a history of GERD, hyperlipidemia and insulin-dependent diabetes now being admitted with: 1. Symptomatic anemia, likely iron deficiency secondary to evidence of low MCV. No evidence of acute blood loss. We will admit the patient to observation with telemetry monitoring. We will transfuse 2 units of packed red blood cells, IV fluids. Iron studies, B12 and folate have already been ordered by the emergency department provider. She was guaiac negative. 2. Insulin dependent diabetes-insulin sliding scale coverage  3. Hyperlipidemia-Diet controlled  Diet/Nutrition: Carb controlled Fluids: IV normal  saline DVT Px: Lovenox, SCDs and early ambulation Code Status: Full  All the records are reviewed and case discussed with ED provider. Management plans discussed with the patient and/or family who express understanding and agree with plan of care.   TOTAL TIME TAKING CARE OF THIS PATIENT: 60 minutes.   Perina Salvaggio D.O. on 11/10/2015 at 11:50 PM Between 7am to 6pm - Pager - [phone] After 6pm go to [email or web address] - Social research officer, government [facility] [place] [facility] Office [phone] CC: Primary care physician; Sherlene Shams, MD     Note: This dictation was prepared with Dragon dictation along with smaller phrase technology. Any transcriptional errors that result from this process are unintentional.

## 2015-11-10 NOTE — Telephone Encounter (Signed)
Spoke with the patient, she is currently in [place], was flying when we tried to call.  I advised her that her Hgb was low and that it needed to be rechecked.  And a possible transfusion may be needed.  She is not sure what to do, I advised that a ER or urgent care would be her best options to get this done there.  She was recluctant to go to the ED.  I explained that her symptoms from yesterday, SOB and all are contributing to the anemia results. She agreed and told me she would return a call to us if needed. thanks

## 2015-11-10 NOTE — Telephone Encounter (Signed)
Call patient at work.  The reason she is so short of breath with exertion may be severe anemai .  Needs to go to ER for repeat CBC and transfusion if it is real.

## 2015-11-11 ENCOUNTER — Telehealth: Payer: Self-pay | Admitting: *Deleted

## 2015-11-11 ENCOUNTER — Other Ambulatory Visit: Payer: Self-pay

## 2015-11-11 DIAGNOSIS — D500 Iron deficiency anemia secondary to blood loss (chronic): Secondary | ICD-10-CM | POA: Diagnosis not present

## 2015-11-11 DIAGNOSIS — K219 Gastro-esophageal reflux disease without esophagitis: Secondary | ICD-10-CM | POA: Diagnosis not present

## 2015-11-11 DIAGNOSIS — J90 Pleural effusion, not elsewhere classified: Secondary | ICD-10-CM | POA: Diagnosis not present

## 2015-11-11 DIAGNOSIS — E785 Hyperlipidemia, unspecified: Secondary | ICD-10-CM | POA: Diagnosis not present

## 2015-11-11 DIAGNOSIS — D509 Iron deficiency anemia, unspecified: Secondary | ICD-10-CM | POA: Diagnosis not present

## 2015-11-11 DIAGNOSIS — Z79899 Other long term (current) drug therapy: Secondary | ICD-10-CM | POA: Diagnosis not present

## 2015-11-11 DIAGNOSIS — Z794 Long term (current) use of insulin: Secondary | ICD-10-CM | POA: Diagnosis not present

## 2015-11-11 DIAGNOSIS — Z887 Allergy status to serum and vaccine status: Secondary | ICD-10-CM | POA: Diagnosis not present

## 2015-11-11 DIAGNOSIS — E119 Type 2 diabetes mellitus without complications: Secondary | ICD-10-CM | POA: Diagnosis not present

## 2015-11-11 LAB — GLUCOSE, CAPILLARY
GLUCOSE-CAPILLARY: 267 mg/dL — AB (ref 65–99)
GLUCOSE-CAPILLARY: 206 mg/dL — AB (ref 65–99)

## 2015-11-11 LAB — BASIC METABOLIC PANEL
Anion gap: 6 (ref 5–15)
BUN: 11 mg/dL (ref 6–20)
CHLORIDE: 108 mmol/L (ref 101–111)
CO2: 23 mmol/L (ref 22–32)
CREATININE: 0.54 mg/dL (ref 0.44–1.00)
Calcium: 8.4 mg/dL — ABNORMAL LOW (ref 8.9–10.3)
GFR calc non Af Amer: >60 mL/min (ref >60)
Glucose, Bld: 252 mg/dL — ABNORMAL HIGH (ref 65–99)
POTASSIUM: 4.1 mmol/L (ref 3.5–5.1)
SODIUM: 137 mmol/L (ref 135–145)

## 2015-11-11 LAB — CBC
HEMATOCRIT: 30.5 % — AB (ref 35.0–47.0)
Hemoglobin: 9.3 g/dL — ABNORMAL LOW (ref 12.0–16.0)
MCH: 18.5 pg — ABNORMAL LOW (ref 26.0–34.0)
MCHC: 30.6 g/dL — ABNORMAL LOW (ref 32.0–36.0)
MCV: 60.5 fL — AB (ref 80.0–100.0)
PLATELETS: 394 10*3/uL (ref 150–440)
RBC: 5.05 MIL/uL (ref 3.80–5.20)
RDW: 20.4 % — ABNORMAL HIGH (ref 11.5–14.5)
WBC: 7 10*3/uL (ref 3.6–11.0)

## 2015-11-11 LAB — VITAMIN B12: VITAMIN B 12: 483 pg/mL (ref 180–914)

## 2015-11-11 LAB — FOLATE: FOLATE: 30 ng/mL (ref >5.9)

## 2015-11-11 LAB — ABO/RH: ABO/RH(D): AB POS

## 2015-11-11 LAB — PREPARE RBC (CROSSMATCH)

## 2015-11-11 MED ORDER — IRON SUCROSE 20 MG/ML IV SOLN
400.0000 mg | Freq: Once | INTRAVENOUS | Status: AC
  Administered 2015-11-11: 400 mg via INTRAVENOUS
  Filled 2015-11-11: qty 20

## 2015-11-11 MED ORDER — FERROUS SULFATE 325 (65 FE) MG PO TABS: 325.0000 mg | ORAL_TABLET | Freq: Two times a day (BID) | ORAL | 0 refills | Status: DC

## 2015-11-11 MED ORDER — FERROUS SULFATE 325 (65 FE) MG PO TABS
325.0000 mg | ORAL_TABLET | Freq: Two times a day (BID) | ORAL | Status: DC
Start: 2015-11-12 — End: 2015-11-11

## 2015-11-11 NOTE — Assessment & Plan Note (Signed)
Patient was notified while out of town of hgb < 7 and rshe eturned home same day .  She was [facility] anemia and transufsed 2 units , repeat hgb 9.3 .  Iron stores were very low,  And she was guaiac negative per review of ER records.   Lab Results  Component Value Date   WBC 7.0 11/11/2015   HGB 9.3 (L) 11/11/2015   HCT 30.5 (L) 11/11/2015   MCV 60.5 (L) 11/11/2015   PLT 394 11/11/2015

## 2015-11-11 NOTE — Discharge Summary (Signed)
[facility] - [place] at [facility]   PATIENT NAME: Melissa JonesDena Beranek    MR#:  [id number]  DATE OF BIRTH:  Sep 30, 1965  DATE OF ADMISSION:  11/10/2015 ADMITTING PHYSICIAN: Tonye RoyaltyAlexis Hugelmeyer, DO  DATE OF DISCHARGE: 11/11/2015  PRIMARY CARE PHYSICIAN: Sherlene ShamsULLO, TERESA L, MD    ADMISSION DIAGNOSIS:  Symptomatic anemia [D64.9]  DISCHARGE DIAGNOSIS:  Active Problems:   Symptomatic anemia   SECONDARY DIAGNOSIS:   Past Medical History:  Diagnosis Date  . Diabetes mellitus   . GERD (gastroesophageal reflux disease)   . Hyperlipidemia     HOSPITAL COURSE:   1. Symptomatic iron deficiency anemia. Patient was transfused 2 units of packed red blood cells. Hemoglobin was 7.4 upon presentation and up to 9.3 with transfusion. Patient's ferritin was 3. I did give 400 mg of IV venofer while here and prescribed oral iron upon discharge home. I do recommend outpatient gastrointestinal workup. Unfortunately no GI coverage on the day that she was here. Refer to Dr. [name] as outpatient. Patient still menstruates which could be the cause of chronic blood loss anemia. 2. Type 2 diabetes mellitus. I continued her usual medication 3. GERD on omeprazole   DISCHARGE CONDITIONS:   Satisfactory  CONSULTS OBTAINED:   no gastrointestinal coverage when she was here.  DRUG ALLERGIES:   Allergies  Allergen Reactions  . Tetanus Toxoids     high fevers, systemic illness     DISCHARGE MEDICATIONS:   Current Discharge Medication List    START taking these medications   Details  ferrous sulfate 325 (65 FE) MG tablet Take 1 tablet (325 mg total) by mouth 2 (two) times daily with a meal. Qty: 60 tablet, Refills: 0      CONTINUE these medications which have NOT CHANGED   Details  glipiZIDE (GLUCOTROL) 10 MG tablet Take 10 mg by mouth 2 (two) times daily before a meal.    !! glucose blood test strip Use once daily to check cbg   DM type 2 Qty: 100 each, Refills: 1    Insulin  Glargine (LANTUS SOLOSTAR) 100 UNIT/ML Solostar Pen Take 15 units subcutaneously each night    metformin (FORTAMET) 1000 MG (OSM) 24 hr tablet Take 1,000 mg by mouth 2 (two) times daily with a meal.     NUVARING 0.12-0.015 MG/24HR vaginal ring     omeprazole (PRILOSEC) 20 MG capsule Take 20 mg by mouth daily.    !! ONE TOUCH ULTRA TEST test strip USE 3 TIMES DAILY TO CHECK BLOOD SUGAR. Qty: 100 each, Refills: 2     !! - Potential duplicate medications found. Please discuss with provider.       DISCHARGE INSTRUCTIONS:   Follow-up PMD one week Follow-up Dr. [name] gastroenterology as outpatient  If you experience worsening of your admission symptoms, develop shortness of breath, life threatening emergency, suicidal or homicidal thoughts you must seek medical attention immediately by calling 911 or calling your MD immediately  if symptoms less severe.  You Must read complete instructions/literature along with all the possible adverse reactions/side effects for all the Medicines you take and that have been prescribed to you. Take any new Medicines after you have completely understood and accept all the possible adverse reactions/side effects.   Please note  You were cared for by a hospitalist during your hospital stay. If you have any questions about your discharge medications or the care you received while you were in the hospital after you are discharged, you can call the unit and asked  to speak with the hospitalist on call if the hospitalist that took care of you is not available. Once you are discharged, your primary care physician will handle any further medical issues. Please note that NO REFILLS for any discharge medications will be authorized once you are discharged, as it is imperative that you return to your primary care physician (or establish a relationship with a primary care physician if you do not have one) for your aftercare needs so that they can reassess your need for  medications and monitor your lab values.    Today   CHIEF COMPLAINT:   Chief Complaint  Patient presents with  . Abnormal Lab    HISTORY OF PRESENT ILLNESS:  Melissa Werner  is a 50 y.o. female. Patient sent in for anemia.   VITAL SIGNS:  Blood pressure 138/67, pulse 88, temperature 98.8 F (37.1 C), temperature source Oral, resp. rate 16, height 5\' 7"  (1.702 m), weight 78 kg (171 lb 14.4 oz), last menstrual period 10/24/2015, SpO2 100 %.    PHYSICAL EXAMINATION:  GENERAL:  50 y.o.-year-old patient lying in the bed with no acute distress.  EYES: Pupils equal, round, reactive to light and accommodation. No scleral icterus. Extraocular muscles intact.  HEENT: Head atraumatic, normocephalic. Oropharynx and nasopharynx clear.  NECK:  Supple, no jugular venous distention. No thyroid enlargement, no tenderness.  LUNGS: Normal breath sounds bilaterally, no wheezing, rales,rhonchi or crepitation. No use of accessory muscles of respiration.  CARDIOVASCULAR: S1, S2 normal. No murmurs, rubs, or gallops.  ABDOMEN: Soft, non-tender, non-distended. Bowel sounds present. No organomegaly or mass.  EXTREMITIES: No pedal edema, cyanosis, or clubbing.  NEUROLOGIC: Cranial nerves II through XII are intact. Muscle strength 5/5 in all extremities. Sensation intact. Gait not checked.  PSYCHIATRIC: The patient is alert and oriented x 3.  SKIN: No obvious rash, lesion, or ulcer.   DATA REVIEW:   CBC  Recent Labs Lab 11/11/15 0821  WBC 7.0  HGB 9.3*  HCT 30.5*  PLT 394    Chemistries   Recent Labs Lab 11/10/15 2032 11/10/15 2055 11/11/15 0821  NA 138  --  137  K 3.8  --  4.1  CL 106  --  108  CO2 22  --  23  GLUCOSE 239*  --  252*  BUN 10  --  11  CREATININE 0.68  --  0.54  CALCIUM 9.1  --  8.4*  MG  --  1.7  --   AST 19  --   --   ALT 11*  --   --   ALKPHOS 48  --   --   BILITOT 0.2*  --   --     Cardiac Enzymes  Recent Labs Lab 11/10/15 2055  TROPONINI <0.03       RADIOLOGY:  Dg Chest 2 View  Result Date: 11/10/2015 CLINICAL DATA:  New onset exertional dyspnea, history diabetes mellitus, GERD EXAM: CHEST  2 VIEW COMPARISON:  None FINDINGS: Normal heart size, mediastinal contours, and pulmonary vascularity. Minimal peribronchial thickening. Lungs otherwise clear. Pleural effusion pneumothorax. Bones unremarkable. IMPRESSION: Minimal bronchitic changes without infiltrate. Electronically Signed   By: Ulyses Southward M.D.   On: 11/10/2015 08:20    Management plans discussed with the patient, And she is in agreement.  CODE STATUS:     Code Status Orders        Start     Ordered   11/10/15 2329  Full code  Continuous     11/10/15 2329  Code Status History    Date Active Date Inactive Code Status Order ID Comments User Context   This patient has a current code status but no historical code status.      TOTAL TIME TAKING CARE OF THIS PATIENT: 35 minutes.    Alford Highland M.D on 11/11/2015 at 2:17 PM  Between 7am to 6pm - Pager - [phone]  After 6pm go to [email or web address] - password [employer]  [facility] Office  [phone]  CC: Primary care physician; Sherlene Shams, MD

## 2015-11-11 NOTE — Telephone Encounter (Signed)
Appointment to be scheduled with 11/18/15 at 11:30.  Attempted to contact [place] at number listed but it is a fax number.  Will notify patient during transitional care management follow up.

## 2015-11-11 NOTE — Progress Notes (Signed)
Discharge instructions along with home medications and follow up gone over with patient. Pt verbalizes that she understood instructions. One prescription given to patient. IV and tele removed. Pt being discharged home on room air, no distress noted. Pt walked down to visitors entrance to drive herself home. Otilio JeffersonMadelyn S Fenton, RN

## 2015-11-11 NOTE — Assessment & Plan Note (Addendum)
Loss of control by history started in August, when she stopped using Trulicity that was prescribed by Dr [name].   Advised to increase Lantus to 20 units and follow up with Dr Hassell DoneSoluum to discuss resuming Trulicity  Lab Results  Component Value Date   HGBA1C 8.5 (H) 11/09/2015

## 2015-11-11 NOTE — Telephone Encounter (Signed)
Called patient and notified of appointment 11/18/15.       Verbalized understanding.  Requested a call back on Monday regarding transitional care management.  Will follow as appropriate.

## 2015-11-11 NOTE — Telephone Encounter (Signed)
Patient will discharge from [facility] on 09/15. She was admitted for symptomatic anemia. Please give a time and date to place pt for a HFU.  Contact [facility] Arlean Hopping-[facility] [phone] with appt or Pt contact [phone]

## 2015-11-11 NOTE — Progress Notes (Signed)
Inpatient Diabetes Program Recommendations  AACE/ADA: New Consensus Statement on Inpatient Glycemic Control (2015)  Target Ranges:  Prepandial:   less than 140 mg/dL      Peak postprandial:   less than 180 mg/dL (1-2 hours)      Critically ill patients:  140 - 180 mg/dL   Results for Melissa Werner, Latisia L (MRN [id number]) as of 11/11/2015 09:49  Ref. Range 11/10/2015 23:25 11/11/2015 07:45  Glucose-Capillary Latest Ref Range: 65 - 99 mg/dL 409195 (H) 811267 (H)   Review of Glycemic Control  Diabetes history: DM2 Outpatient Diabetes medications: Lantus 15 units QHS, Glipizide 10 mg BID, Fortamet 1000 mg BID Current orders for Inpatient glycemic control: Novolog 0-15 units TID with meals, Novolog 0-5 units QHS  Inpatient Diabetes Program Recommendations: Insulin - Basal: Fasting glucose 267 mg/dl today and no Lantus given last night. Please consider ordering Lantus 12 units daily starting now (based on 78 kg x 0.15 units). HgbA1C: A1C 8.5% on 11/09/2015. However, hemoglobin 6.8 g/dL on 9/14/789/13/17. Therefore, A1C likely not accurate.  Thanks, Orlando PennerMarie Denissa Cozart, RN, MSN, CDE Diabetes Coordinator Inpatient Diabetes Program [phone] (Team Pager from 8am to 5pm) [phone] ([facility] office) [phone] [facility] office) [phone] [facility] office)

## 2015-11-12 LAB — TYPE AND SCREEN
ABO/RH(D): AB POS
ANTIBODY SCREEN: NEGATIVE
UNIT DIVISION: 0
Unit division: 0

## 2015-11-13 ENCOUNTER — Encounter: Payer: Self-pay | Admitting: Internal Medicine

## 2015-11-13 LAB — FOLATE RBC
FOLATE, HEMOLYSATE: 527 ng/mL
FOLATE, RBC: 2019 ng/mL (ref >498)
HEMATOCRIT: 26.1 % — AB (ref 34.0–46.6)

## 2015-11-14 ENCOUNTER — Telehealth: Payer: Self-pay | Admitting: *Deleted

## 2015-11-14 ENCOUNTER — Telehealth: Payer: Self-pay

## 2015-11-14 NOTE — Telephone Encounter (Signed)
Transition Care Management Follow-up Telephone Call   Date discharged? 11/11/15.   How have you been since you were released from the hospital? I AM AT BASELINE.  NO SOB. NO FATIGUE. NO PAIN. EATING AND DRINKING WITHOUT ISSUES.  RESTING FINE. NO FEVER/CHILLS.   Do you understand why you were in the hospital? YES, ANEMIA.  ABNORMAL LABS.     Do you understand the discharge instructions? YES, INCREASE ACTIVITY AS TOLERATED.  FOLLOW UP WITH PCP.   Where were you discharged to? Home.   Items Reviewed:  Medications reviewed: YES, TAKING ALL SCHEDULED MEDICATIONS AS DIRECTED, NO ISSUES.  Allergies reviewed: YES, TETANUS TOXOIDS.  Dietary changes reviewed: YES, REGULAR DIET. NO RESTRICTIONS.  Referrals reviewed: YES, GASTROENTEROLOGY.   Functional Questionnaire:   Activities of Daily Living (ADLs):   She states they are independent in the following: Independent in all ADLs. States they require assistance with the following: Does not require assistance at this time.   Any transportation issues/concerns?: NO.   Any patient concerns? None at this time.   Confirmed importance and date/time of follow-up visits scheduled YES, appointment scheduled 11/18/15 AT 11:30.  Provider Appointment booked with Dr. Darrick Huntsmanullo (PCP).  Confirmed with patient if condition begins to worsen call PCP or go to the ER.  Patient was given the office number and encouraged to call back with question or concerns.  : YES, PATIENT VERBALIZED UNDERSTANDING.

## 2015-11-14 NOTE — Telephone Encounter (Signed)
Pt will need a HFU, she discharged on 09/[facility]. She will need a follow up Pt contact [phone]

## 2015-11-14 NOTE — Telephone Encounter (Signed)
Will follow as appropriate. 

## 2015-11-17 ENCOUNTER — Other Ambulatory Visit: Payer: Self-pay

## 2015-11-17 ENCOUNTER — Ambulatory Visit (INDEPENDENT_AMBULATORY_CARE_PROVIDER_SITE_OTHER): Payer: 59

## 2015-11-17 DIAGNOSIS — R0609 Other forms of dyspnea: Secondary | ICD-10-CM | POA: Diagnosis not present

## 2015-11-17 LAB — ECHOCARDIOGRAM COMPLETE
Ao-asc: 29 cm
CHL CUP DOP CALC LVOT VTI: 21.5 cm
CHL CUP MV DEC (S): 239
E/e' ratio: 11.26
EWDT: 239 ms
FS: 51 % — AB (ref 28–44)
IV/PV OW: 1.13
LA ID, A-P, ES: 36 mm
LA diam index: 1.89 cm/m2
LA vol: 48.6 mL
LAVOLA4C: 48.2 mL
LAVOLIN: 25.6 mL/m2
LEFT ATRIUM END SYS DIAM: 36 mm
LVEEAVG: 11.26
LVEEMED: 11.26
LVOT SV: 61 mL
LVOT area: 2.84 cm2
LVOT peak vel: 106 cm/s
LVOTD: 19 mm
MV Peak grad: 2 mmHg
MV pk A vel: 93.6 m/s
MVPKEVEL: 72.3 m/s
PW: 10.6 mm — AB (ref 0.6–1.1)
TAPSE: 24.9 mm
TDI e' medial: 6.42

## 2015-11-18 ENCOUNTER — Ambulatory Visit (INDEPENDENT_AMBULATORY_CARE_PROVIDER_SITE_OTHER): Payer: 59 | Admitting: Internal Medicine

## 2015-11-18 ENCOUNTER — Encounter: Payer: Self-pay | Admitting: Internal Medicine

## 2015-11-18 VITALS — BP 122/78 | HR 93 | Temp 98.3°F | Ht 67.0 in | Wt 169.0 lb

## 2015-11-18 DIAGNOSIS — D649 Anemia, unspecified: Secondary | ICD-10-CM

## 2015-11-18 DIAGNOSIS — D509 Iron deficiency anemia, unspecified: Secondary | ICD-10-CM | POA: Diagnosis not present

## 2015-11-18 DIAGNOSIS — Z09 Encounter for follow-up examination after completed treatment for conditions other than malignant neoplasm: Secondary | ICD-10-CM | POA: Diagnosis not present

## 2015-11-18 LAB — CBC WITH DIFFERENTIAL/PLATELET
BASOS ABS: 0.1 10*3/uL (ref 0.0–0.1)
Basophils Relative: 0.7 % (ref 0.0–3.0)
EOS PCT: 1 % (ref 0.0–5.0)
Eosinophils Absolute: 0.1 10*3/uL (ref 0.0–0.7)
HCT: 34.2 % — ABNORMAL LOW (ref 36.0–46.0)
Hemoglobin: 10.3 g/dL — ABNORMAL LOW (ref 12.0–15.0)
LYMPHS ABS: 2.7 10*3/uL (ref 0.7–4.0)
Lymphocytes Relative: 34.3 % (ref 12.0–46.0)
MCHC: 29.9 g/dL — AB (ref 30.0–36.0)
MCV: 64.8 fl — AB (ref 78.0–100.0)
MONO ABS: 0.7 10*3/uL (ref 0.1–1.0)
MONOS PCT: 8.9 % (ref 3.0–12.0)
NEUTROS ABS: 4.4 10*3/uL (ref 1.4–7.7)
NEUTROS PCT: 55.1 % (ref 43.0–77.0)
PLATELETS: 218 10*3/uL (ref 150.0–400.0)
RBC: 5.28 Mil/uL — ABNORMAL HIGH (ref 3.87–5.11)
RDW: 37 % — ABNORMAL HIGH (ref 11.5–15.5)
WBC: 7.9 10*3/uL (ref 4.0–10.5)

## 2015-11-18 LAB — BASIC METABOLIC PANEL
BUN: 10 mg/dL (ref 6–23)
CALCIUM: 9.2 mg/dL (ref 8.4–10.5)
CO2: 26 mEq/L (ref 19–32)
CREATININE: 0.61 mg/dL (ref 0.40–1.20)
Chloride: 101 mEq/L (ref 96–112)
GFR: 110.48 mL/min (ref >60.00)
Glucose, Bld: 197 mg/dL — ABNORMAL HIGH (ref 70–99)
Potassium: 4 mEq/L (ref 3.5–5.1)
Sodium: 136 mEq/L (ref 135–145)

## 2015-11-18 LAB — RETICULOCYTES
ABS Retic: 36890 cells/uL (ref 20000–80000)
RBC.: 5.27 MIL/uL — ABNORMAL HIGH (ref 3.80–5.10)
Retic Ct Pct: 0.7 %

## 2015-11-18 NOTE — Progress Notes (Signed)
Pre visit review using our clinic review tool, if applicable. No additional management support is needed unless otherwise documented below in the visit note. 

## 2015-11-18 NOTE — Progress Notes (Signed)
Subjective:  Patient ID: Melissa Werner, female    DOB: 18-Feb-1966  Age: 50 y.o. MRN: [id number]  CC: The primary encounter diagnosis was Anemia, iron deficiency. Diagnoses of Hypocalcemia, Symptomatic anemia, and Hospital discharge follow-up were also pertinent to this visit.  HPI Melissa ElkDena L Milanese presents for hospital follow up,  [facility] with symptomatic anemia,  hgb < 8 .  Received 2 units of PRBCs and discharged on Sept 15th   Labs reviewed: Foalte and B12 were normal.  TIBC > 550 ferritin 3.  Hemoccult was negative   She feels somewhat better ,  Less tachycardic  But still short of breath with a flight of stairs.  Not with ADLs. Her blood sugars have been significantly improved since receiving  the transfusion ;  Workup for cause of anemia is in progress .  Seh has an appointment with Dr [name] for Oct 31st.      Outpatient Medications Prior to Visit  Medication Sig Dispense Refill  . ferrous sulfate 325 (65 FE) MG tablet Take 1 tablet (325 mg total) by mouth 2 (two) times daily with a meal. 60 tablet 0  . glipiZIDE (GLUCOTROL) 10 MG tablet Take 10 mg by mouth 2 (two) times daily before a meal.    . glucose blood test strip Use once daily to check cbg   DM type 2 100 each 1  . Insulin Glargine (LANTUS SOLOSTAR) 100 UNIT/ML Solostar Pen Take 15 units subcutaneously each night    . metformin (FORTAMET) 1000 MG (OSM) 24 hr tablet Take 1,000 mg by mouth 2 (two) times daily with a meal.     . NUVARING 0.12-0.015 MG/24HR vaginal ring     . omeprazole (PRILOSEC) 20 MG capsule Take 20 mg by mouth daily.    . ONE TOUCH ULTRA TEST test strip USE 3 TIMES DAILY TO CHECK BLOOD SUGAR. 100 each 2   No facility-administered medications prior to visit.     Review of Systems;  Patient denies headache, fevers, malaise, unintentional weight loss, skin rash, eye pain, sinus congestion and sinus pain, sore throat, dysphagia,  hemoptysis , cough, dyspnea, wheezing, chest  pain, palpitations, orthopnea, edema, abdominal pain, nausea, melena, diarrhea, constipation, flank pain, dysuria, hematuria, urinary  Frequency, nocturia, numbness, tingling, seizures,  Focal weakness, Loss of consciousness,  Tremor, insomnia, depression, anxiety, and suicidal ideation.      Objective:  BP 122/78   Pulse 93   Temp 98.3 F (36.8 C) (Oral)   Ht 5\' 7"  (1.702 m)   Wt 169 lb (76.7 kg)   LMP 10/24/2015 (Approximate)   SpO2 97%   BMI 26.47 kg/m   BP Readings from Last 3 Encounters:  11/18/15 122/78  11/11/15 138/67  11/09/15 (!) 148/84    Wt Readings from Last 3 Encounters:  11/18/15 169 lb (76.7 kg)  11/10/15 171 lb 14.4 oz (78 kg)  11/09/15 175 lb 3.2 oz (79.5 kg)    General appearance: alert, cooperative and appears stated age Ears: normal TM's and external ear canals both ears Throat: lips, mucosa, and tongue normal; teeth and gums normal Neck: no adenopathy, no carotid bruit, supple, symmetrical, trachea midline and thyroid not enlarged, symmetric, no tenderness/mass/nodules Back: symmetric, no curvature. ROM normal. No CVA tenderness. Lungs: clear to auscultation bilaterally Heart: regular rate and rhythm, S1, S2 normal, no murmur, click, rub or gallop Abdomen: soft, non-tender; bowel sounds normal; no masses,  no organomegaly Pulses: 2+ and symmetric Skin: Skin color, texture,  turgor normal. No rashes or lesions Lymph nodes: Cervical, supraclavicular, and axillary nodes normal.  Lab Results  Component Value Date   HGBA1C 8.5 (H) 11/09/2015   HGBA1C 7.3 03/22/2015   HGBA1C 7.3 (A) 08/25/2014    Lab Results  Component Value Date   CREATININE 0.61 11/18/2015   CREATININE 0.54 11/11/2015   CREATININE 0.68 11/10/2015    Lab Results  Component Value Date   WBC 7.9 11/18/2015   HGB 10.3 Repeated and verified X2. (L) 11/18/2015   HCT 34.2 (L) 11/18/2015   PLT 218.0 11/18/2015   GLUCOSE 197 (H) 11/18/2015   CHOL 133 11/09/2015   TRIG 259.0 (H)  11/09/2015   HDL 45.20 11/09/2015   LDLDIRECT 58.0 11/09/2015   LDLCALC 51 11/25/2014   ALT 11 (L) 11/10/2015   AST 19 11/10/2015   NA 136 11/18/2015   K 4.0 11/18/2015   CL 101 11/18/2015   CREATININE 0.61 11/18/2015   BUN 10 11/18/2015   CO2 26 11/18/2015   TSH 3.02 11/25/2014   INR 1.00 11/10/2015   HGBA1C 8.5 (H) 11/09/2015   MICROALBUR <0.7 11/09/2015    No results found.  Assessment & Plan:   Problem List Items Addressed This Visit    Symptomatic anemia    She was transfused two units in house for hgb  8 accompanied by tachycardia, and  dyspnea.   Lab Results  Component Value Date   IRON 6 (L) 11/10/2015   TIBC 557 (H) 11/10/2015   FERRITIN 3 (L) 11/10/2015         Anemia, iron deficiency - Primary    Etiology unclear,  FOBT was negative and menses are not particularly heavy.  GI referral for EGD/colnoscopy .  May need hematology evaluation . Starting iron once daily   Lab Results  Component Value Date   WBC 7.9 11/18/2015   HGB 10.3 Repeated and verified X2. (L) 11/18/2015   HCT 34.2 (L) 11/18/2015   MCV 64.8 (L) 11/18/2015   PLT 218.0 11/18/2015         Relevant Orders   CBC with Differential/Platelet (Completed)   Erythropoietin   Reticulocytes   Hypocalcemia    Normal on repeat.   Lab Results  Component Value Date   CALCIUM 9.2 11/18/2015         Relevant Orders   Basic metabolic panel (Completed)   PTH, Intact and Calcium   Hospital discharge follow-up    Patient is stable post discharge and has no new issues or questions about discharge plans at the visit today for hospital follow up.  I have reviewed the records from the hospital admission in detail with patient today. All labs , imaging studies and progress notes from admission were reviewed with patient today        Other Visit Diagnoses   None.     I am having Ms. Weatherholtz maintain her NUVARING, glucose blood, omeprazole, ONE TOUCH ULTRA TEST, metformin, Insulin Glargine,  glipiZIDE, and ferrous sulfate.  No orders of the defined types were placed in this encounter.   There are no discontinued medications.  Follow-up: No Follow-up on file.   Sherlene Shams, MD

## 2015-11-19 DIAGNOSIS — E611 Iron deficiency: Secondary | ICD-10-CM | POA: Insufficient documentation

## 2015-11-19 DIAGNOSIS — Z09 Encounter for follow-up examination after completed treatment for conditions other than malignant neoplasm: Secondary | ICD-10-CM | POA: Insufficient documentation

## 2015-11-19 NOTE — Assessment & Plan Note (Signed)
She was transfused two units in house for hgb  8 accompanied by tachycardia, and  dyspnea.   Lab Results  Component Value Date   IRON 6 (L) 11/10/2015   TIBC 557 (H) 11/10/2015   FERRITIN 3 (L) 11/10/2015

## 2015-11-19 NOTE — Assessment & Plan Note (Signed)
Etiology unclear,  FOBT was negative and menses are not particularly heavy.  GI referral for EGD/colnoscopy .  May need hematology evaluation . Starting iron once daily   Lab Results  Component Value Date   WBC 7.9 11/18/2015   HGB 10.3 Repeated and verified X2. (L) 11/18/2015   HCT 34.2 (L) 11/18/2015   MCV 64.8 (L) 11/18/2015   PLT 218.0 11/18/2015

## 2015-11-19 NOTE — Assessment & Plan Note (Signed)
Normal on repeat.   Lab Results  Component Value Date   CALCIUM 9.2 11/18/2015

## 2015-11-19 NOTE — Assessment & Plan Note (Signed)
Patient is stable post discharge and has no new issues or questions about discharge plans at the visit today for hospital follow up.  I have reviewed the records from the hospital admission in detail with patient today. All labs , imaging studies and progress notes from admission were reviewed with patient today

## 2015-11-20 ENCOUNTER — Encounter: Payer: Self-pay | Admitting: Internal Medicine

## 2015-11-21 LAB — ERYTHROPOIETIN: Erythropoietin: 29.1 m[IU]/mL — ABNORMAL HIGH (ref 2.6–18.5)

## 2015-11-21 LAB — PTH, INTACT AND CALCIUM
CALCIUM: 9.4 mg/dL (ref 8.6–10.2)
PTH: 12 pg/mL — ABNORMAL LOW (ref 14–64)

## 2015-11-22 ENCOUNTER — Encounter: Payer: Self-pay | Admitting: Internal Medicine

## 2015-11-22 DIAGNOSIS — Z794 Long term (current) use of insulin: Secondary | ICD-10-CM | POA: Diagnosis not present

## 2015-11-22 DIAGNOSIS — E1165 Type 2 diabetes mellitus with hyperglycemia: Secondary | ICD-10-CM | POA: Diagnosis not present

## 2015-11-23 ENCOUNTER — Ambulatory Visit: Payer: Self-pay | Admitting: Internal Medicine

## 2015-12-26 ENCOUNTER — Ambulatory Visit (INDEPENDENT_AMBULATORY_CARE_PROVIDER_SITE_OTHER): Payer: 59 | Admitting: Internal Medicine

## 2015-12-26 ENCOUNTER — Encounter: Payer: Self-pay | Admitting: Internal Medicine

## 2015-12-26 VITALS — BP 140/90 | HR 90 | Wt 174.0 lb

## 2015-12-26 DIAGNOSIS — D509 Iron deficiency anemia, unspecified: Secondary | ICD-10-CM | POA: Diagnosis not present

## 2015-12-26 NOTE — Progress Notes (Signed)
Subjective:  Patient ID: Melissa Werner, female    DOB: 1965-11-21  Age: 50 y.o. MRN: [id number]  CC: The encounter diagnosis was Iron deficiency anemia, unspecified iron deficiency anemia type.  HPI Melissa Werner presents for follow up on new onset iron deficiency anemia found in September, requiring transfusion .  She has been taking Ferra sol daily with good tolerance, no constipation.  Feeling much better  Exercise tolerance has retruend , and she ran a 5K this weekend without issues.    Fecal occult test during hospitalization was negative.  Stools have been green , semi formed (has chronic diarrhea )  Has GI appt tomorrow with Melissa Werner.  No prior EGD or colonoscopy  BS no longer in the 400's pre transfusion   Lab Results  Component Value Date   WBC 7.9 11/18/2015   HGB 10.3 Repeated and verified X2. (L) 11/18/2015   HCT 34.2 (L) 11/18/2015   MCV 64.8 (L) 11/18/2015   PLT 218.0 11/18/2015     Outpatient Medications Prior to Visit  Medication Sig Dispense Refill  . ferrous sulfate 325 (65 FE) MG tablet Take 1 tablet (325 mg total) by mouth 2 (two) times daily with a meal. 60 tablet 0  . glipiZIDE (GLUCOTROL) 10 MG tablet Take 10 mg by mouth 2 (two) times daily before a meal.    . glucose blood test strip Use once daily to check cbg   DM type 2 100 each 1  . Insulin Glargine (LANTUS SOLOSTAR) 100 UNIT/ML Solostar Pen Take 20 units subcutaneously each night    . NUVARING 0.12-0.015 MG/24HR vaginal ring     . omeprazole (PRILOSEC) 20 MG capsule Take 20 mg by mouth daily.    . ONE TOUCH ULTRA TEST test strip USE 3 TIMES DAILY TO CHECK BLOOD SUGAR. 100 each 2   No facility-administered medications prior to visit.     Review of Systems;  Patient denies headache, fevers, malaise, unintentional weight loss, skin rash, eye pain, sinus congestion and sinus pain, sore throat, dysphagia,  hemoptysis , cough, dyspnea, wheezing, chest pain, palpitations, orthopnea, edema, abdominal  pain, nausea, melena, diarrhea, constipation, flank pain, dysuria, hematuria, urinary  Frequency, nocturia, numbness, tingling, seizures,  Focal weakness, Loss of consciousness,  Tremor, insomnia, depression, anxiety, and suicidal ideation.      Objective:  BP 140/90   Pulse 90   Wt 174 lb (78.9 kg)   LMP 12/12/2015   SpO2 99%   BMI 27.25 kg/m   BP Readings from Last 3 Encounters:  12/26/15 140/90  11/18/15 122/78  11/11/15 138/67    Wt Readings from Last 3 Encounters:  12/26/15 174 lb (78.9 kg)  11/18/15 169 lb (76.7 kg)  11/10/15 171 lb 14.4 oz (78 kg)    General appearance: alert, cooperative and appears stated age Ears: normal TM's and external ear canals both ears Throat: lips, mucosa, and tongue normal; teeth and gums normal Neck: no adenopathy, no carotid bruit, supple, symmetrical, trachea midline and thyroid not enlarged, symmetric, no tenderness/mass/nodules Back: symmetric, no curvature. ROM normal. No CVA tenderness. Lungs: clear to auscultation bilaterally Heart: regular rate and rhythm, S1, S2 normal, no murmur, click, rub or gallop Abdomen: soft, non-tender; bowel sounds normal; no masses,  no organomegaly Pulses: 2+ and symmetric Skin: Skin color, texture, turgor normal. No rashes or lesions Lymph nodes: Cervical, supraclavicular, and axillary nodes normal.  Lab Results  Component Value Date   HGBA1C 8.5 (H) 11/09/2015   HGBA1C 7.3 03/22/2015  HGBA1C 7.3 (A) 08/25/2014    Lab Results  Component Value Date   CREATININE 0.61 11/18/2015   CREATININE 0.54 11/11/2015   CREATININE 0.68 11/10/2015    Lab Results  Component Value Date   WBC 7.9 11/18/2015   HGB 10.3 Repeated and verified X2. (L) 11/18/2015   HCT 34.2 (L) 11/18/2015   PLT 218.0 11/18/2015   GLUCOSE 197 (H) 11/18/2015   CHOL 133 11/09/2015   TRIG 259.0 (H) 11/09/2015   HDL 45.20 11/09/2015   LDLDIRECT 58.0 11/09/2015   LDLCALC 51 11/25/2014   ALT 11 (L) 11/10/2015   AST 19  11/10/2015   NA 136 11/18/2015   K 4.0 11/18/2015   CL 101 11/18/2015   CREATININE 0.61 11/18/2015   BUN 10 11/18/2015   CO2 26 11/18/2015   TSH 3.02 11/25/2014   INR 1.00 11/10/2015   HGBA1C 8.5 (H) 11/09/2015   MICROALBUR <0.7 11/09/2015    No results found.  Assessment & Plan:   Problem List Items Addressed This Visit    Anemia, iron deficiency - Primary    Etiology unclear,  FOBT was negative and menses are not particularly heavy.  GI referral for EGD/colnoscopy is tomorrow  . Her anemia is responding to oral iron supplementation and will be repeated today  . Lab Results  Component Value Date   WBC 7.9 11/18/2015   HGB 10.3 Repeated and verified X2. (L) 11/18/2015   HCT 34.2 (L) 11/18/2015   MCV 64.8 (L) 11/18/2015   PLT 218.0 11/18/2015         Relevant Orders   CBC with Differential/Platelet   IBC panel   Ferritin    Other Visit Diagnoses   None.     I am having Melissa Werner maintain her NUVARING, glucose blood, omeprazole, ONE TOUCH ULTRA TEST, Insulin Glargine, glipiZIDE, and ferrous sulfate.  No orders of the defined types were placed in this encounter.   There are no discontinued medications.  Follow-up: No Follow-up on file.   Sherlene ShamsULLO, Rudie Sermons L, MD

## 2015-12-26 NOTE — Assessment & Plan Note (Signed)
Etiology unclear,  FOBT was negative and menses are not particularly heavy.  GI referral for EGD/colnoscopy is tomorrow  . Her anemia is responding to oral iron supplementation and will be repeated today  . Lab Results  Component Value Date   WBC 7.9 11/18/2015   HGB 10.3 Repeated and verified X2. (L) 11/18/2015   HCT 34.2 (L) 11/18/2015   MCV 64.8 (L) 11/18/2015   PLT 218.0 11/18/2015

## 2015-12-27 DIAGNOSIS — D509 Iron deficiency anemia, unspecified: Secondary | ICD-10-CM | POA: Diagnosis not present

## 2015-12-27 LAB — CBC WITH DIFFERENTIAL/PLATELET
BASOS PCT: 0.6 % (ref 0.0–3.0)
Basophils Absolute: 0 10*3/uL (ref 0.0–0.1)
Eosinophils Absolute: 0.2 10*3/uL (ref 0.0–0.7)
Eosinophils Relative: 1.9 % (ref 0.0–5.0)
HEMATOCRIT: 38.7 % (ref 36.0–46.0)
HEMOGLOBIN: 12.3 g/dL (ref 12.0–15.0)
LYMPHS PCT: 38.5 % (ref 12.0–46.0)
Lymphs Abs: 3.2 10*3/uL (ref 0.7–4.0)
MCHC: 31.9 g/dL (ref 30.0–36.0)
MCV: 75.2 fl — AB (ref 78.0–100.0)
MONOS PCT: 5 % (ref 3.0–12.0)
Monocytes Absolute: 0.4 10*3/uL (ref 0.1–1.0)
NEUTROS ABS: 4.5 10*3/uL (ref 1.4–7.7)
Neutrophils Relative %: 54 % (ref 43.0–77.0)
PLATELETS: 272 10*3/uL (ref 150.0–400.0)
RBC: 5.14 Mil/uL — ABNORMAL HIGH (ref 3.87–5.11)
RDW: 29.6 % — AB (ref 11.5–15.5)
WBC: 8.4 10*3/uL (ref 4.0–10.5)

## 2015-12-27 LAB — IBC PANEL
Iron: 26 ug/dL — ABNORMAL LOW (ref 42–145)
SATURATION RATIOS: 5.4 % — AB (ref 20.0–50.0)
Transferrin: 342 mg/dL (ref 212.0–360.0)

## 2015-12-27 LAB — FERRITIN: Ferritin: 11.2 ng/mL (ref 10.0–291.0)

## 2015-12-28 DIAGNOSIS — H5203 Hypermetropia, bilateral: Secondary | ICD-10-CM | POA: Diagnosis not present

## 2016-02-03 ENCOUNTER — Ambulatory Visit: Payer: 59 | Admitting: Anesthesiology

## 2016-02-03 ENCOUNTER — Ambulatory Visit
Admission: RE | Admit: 2016-02-03 | Discharge: 2016-02-03 | Disposition: A | Discharge status: Home / Self Care | Payer: 59 | Source: Ambulatory Visit | Attending: Unknown Physician Specialty | Admitting: Unknown Physician Specialty

## 2016-02-03 ENCOUNTER — Encounter
Admission: RE | Disposition: A | Discharge status: Home / Self Care | Payer: Self-pay | Source: Ambulatory Visit | Attending: Unknown Physician Specialty

## 2016-02-03 DIAGNOSIS — K640 First degree hemorrhoids: Secondary | ICD-10-CM | POA: Insufficient documentation

## 2016-02-03 DIAGNOSIS — Z793 Long term (current) use of hormonal contraceptives: Secondary | ICD-10-CM | POA: Insufficient documentation

## 2016-02-03 DIAGNOSIS — K219 Gastro-esophageal reflux disease without esophagitis: Secondary | ICD-10-CM | POA: Insufficient documentation

## 2016-02-03 DIAGNOSIS — Z803 Family history of malignant neoplasm of breast: Secondary | ICD-10-CM | POA: Insufficient documentation

## 2016-02-03 DIAGNOSIS — Z9889 Other specified postprocedural states: Secondary | ICD-10-CM | POA: Insufficient documentation

## 2016-02-03 DIAGNOSIS — Z8349 Family history of other endocrine, nutritional and metabolic diseases: Secondary | ICD-10-CM | POA: Diagnosis not present

## 2016-02-03 DIAGNOSIS — K2950 Unspecified chronic gastritis without bleeding: Secondary | ICD-10-CM | POA: Insufficient documentation

## 2016-02-03 DIAGNOSIS — Z794 Long term (current) use of insulin: Secondary | ICD-10-CM | POA: Insufficient documentation

## 2016-02-03 DIAGNOSIS — E785 Hyperlipidemia, unspecified: Secondary | ICD-10-CM | POA: Insufficient documentation

## 2016-02-03 DIAGNOSIS — Z887 Allergy status to serum and vaccine status: Secondary | ICD-10-CM | POA: Insufficient documentation

## 2016-02-03 DIAGNOSIS — K3189 Other diseases of stomach and duodenum: Secondary | ICD-10-CM | POA: Diagnosis not present

## 2016-02-03 DIAGNOSIS — K2970 Gastritis, unspecified, without bleeding: Secondary | ICD-10-CM | POA: Diagnosis not present

## 2016-02-03 DIAGNOSIS — D509 Iron deficiency anemia, unspecified: Secondary | ICD-10-CM | POA: Insufficient documentation

## 2016-02-03 DIAGNOSIS — K2980 Duodenitis without bleeding: Secondary | ICD-10-CM | POA: Diagnosis not present

## 2016-02-03 DIAGNOSIS — K648 Other hemorrhoids: Secondary | ICD-10-CM | POA: Diagnosis not present

## 2016-02-03 DIAGNOSIS — E119 Type 2 diabetes mellitus without complications: Secondary | ICD-10-CM | POA: Diagnosis not present

## 2016-02-03 DIAGNOSIS — Z79899 Other long term (current) drug therapy: Secondary | ICD-10-CM | POA: Diagnosis not present

## 2016-02-03 DIAGNOSIS — K2960 Other gastritis without bleeding: Secondary | ICD-10-CM | POA: Diagnosis not present

## 2016-02-03 HISTORY — DX: Anemia, unspecified: D64.9

## 2016-02-03 HISTORY — PX: COLONOSCOPY WITH PROPOFOL: SHX5780

## 2016-02-03 HISTORY — PX: ESOPHAGOGASTRODUODENOSCOPY (EGD) WITH PROPOFOL: SHX5813

## 2016-02-03 LAB — GLUCOSE, CAPILLARY: GLUCOSE-CAPILLARY: 185 mg/dL — AB (ref 65–99)

## 2016-02-03 LAB — HM COLONOSCOPY

## 2016-02-03 LAB — POCT PREGNANCY, URINE: PREG TEST UR: NEGATIVE

## 2016-02-03 SURGERY — COLONOSCOPY WITH PROPOFOL
Anesthesia: General

## 2016-02-03 MED ORDER — FENTANYL CITRATE (PF) 100 MCG/2ML IJ SOLN
INTRAMUSCULAR | Status: DC | PRN
  Administered 2016-02-03: 50 ug via INTRAVENOUS

## 2016-02-03 MED ORDER — SODIUM CHLORIDE 0.9 % IV SOLN: INTRAVENOUS | Status: DC

## 2016-02-03 MED ORDER — LIDOCAINE HCL (CARDIAC) 20 MG/ML IV SOLN
INTRAVENOUS | Status: DC | PRN
  Administered 2016-02-03: 30 mg via INTRAVENOUS

## 2016-02-03 MED ORDER — PROPOFOL 500 MG/50ML IV EMUL
INTRAVENOUS | Status: DC | PRN
  Administered 2016-02-03: 120 ug/kg/min via INTRAVENOUS

## 2016-02-03 MED ORDER — SODIUM CHLORIDE 0.9 % IV SOLN
INTRAVENOUS | Status: DC
  Administered 2016-02-03: 07:00:00 via INTRAVENOUS

## 2016-02-03 MED ORDER — MIDAZOLAM HCL 2 MG/2ML IJ SOLN
INTRAMUSCULAR | Status: DC | PRN
  Administered 2016-02-03: 1 mg via INTRAVENOUS

## 2016-02-03 NOTE — Anesthesia Procedure Notes (Signed)
Performed by: COOK-MARTIN, Tameyah Koch Pre-anesthesia Checklist: Patient identified, Suction available, Emergency Drugs available, Patient being monitored and Timeout performed Patient Re-evaluated:Patient Re-evaluated prior to inductionOxygen Delivery Method: Nasal cannula Preoxygenation: Pre-oxygenation with 100% oxygen Intubation Type: IV induction Airway Equipment and Method: Bite block Placement Confirmation: CO2 detector and positive ETCO2       

## 2016-02-03 NOTE — Op Note (Signed)
[facility] Gastroenterology Patient Name: Melissa JonesDena Werner Procedure Date: 02/03/2016 7:32 AM MRN: [id number] Account #: [id number] Date of Birth: August 14, 1965 Admit Type: Outpatient Age: [age] [facility]OM 4 Gender: Female Note Status: Finalized Procedure:            Upper GI endoscopy Indications:          Iron deficiency anemia Providers:            Scot Junobert T. Arlone Lenhardt, MD Referring MD:         [name], MD (Referring MD) Medicines:            Propofol per Anesthesia Complications:        No immediate complications. Procedure:            Pre-Anesthesia Assessment:                       - After reviewing the risks and benefits, the patient                        was deemed in satisfactory condition to undergo the                        procedure.                       After obtaining informed consent, the endoscope was                        passed under direct vision. Throughout the procedure,                        the patient's blood pressure, pulse, and oxygen                        saturations were monitored continuously. The Endoscope                        was introduced through the mouth, and advanced to the                        second part of duodenum. The upper GI endoscopy was                        accomplished without difficulty. The patient tolerated                        the procedure well. Findings:      The examined esophagus was normal. GEJ 41cm from teeth.      Diffuse mildly erythematous mucosa without bleeding was found in the       gastric body and in the gastric antrum. Biopsies were taken with a cold       forceps for histology. Biopsies were taken with a cold forceps for       Helicobacter pylori testing.      Diffuse mildly erythematous mucosa without active bleeding and with no       stigmata of bleeding was found in the duodenal bulb. Biopsies were taken       with a cold forceps for histology. Check for celiac disease due to  iron       def anemia.  The second portion of the duodenum was normal. Biopsies were taken with       a cold forceps for histology. Impression:           - Normal esophagus.                       - Erythematous mucosa in the gastric body and antrum.                        Biopsied.                       - Erythematous duodenopathy. Biopsied.                       - Normal second portion of the duodenum. Biopsied. Recommendation:       - Await pathology results. Scot Junobert T [name], MD 02/03/2016 7:47:04 AM This report has been signed electronically. Number of Addenda: 0 Note Initiated On: 02/03/2016 7:32 AM      [facility]

## 2016-02-03 NOTE — H&P (Signed)
Primary Care Physician:  Sherlene ShamsULLO, TERESA L, MD Primary Gastroenterologist:  Dr. [name]  Pre-Procedure History & Physical: HPI:  Melissa ElkDena L Mishkin is a 50 y.o. female is here for an endoscopy and colonoscopy.   Past Medical History:  Diagnosis Date  . Anemia    iron deficiency  . Diabetes mellitus   . GERD (gastroesophageal reflux disease)   . Hyperlipidemia     Past Surgical History:  Procedure Laterality Date  . TONSILLECTOMY    . WISDOM TOOTH EXTRACTION      Prior to Admission medications   Medication Sig Start Date End Date Taking? Authorizing Provider  ferrous sulfate 325 (65 FE) MG tablet Take 1 tablet (325 mg total) by mouth 2 (two) times daily with a meal. 11/12/15  Yes Alford Highlandichard Wieting, MD  Insulin Glargine (LANTUS SOLOSTAR) 100 UNIT/ML Solostar Pen Take 20 units subcutaneously each night 01/31/15  Yes Historical Provider, MD  metformin (FORTAMET) 1000 MG (OSM) 24 hr tablet Take 2,000 mg by mouth daily with breakfast.   Yes Historical Provider, MD  omeprazole (PRILOSEC) 20 MG capsule Take 20 mg by mouth daily.   Yes Historical Provider, MD  glipiZIDE (GLUCOTROL) 10 MG tablet Take 10 mg by mouth 2 (two) times daily before a meal.    Historical Provider, MD  glucose blood test strip Use once daily to check cbg   DM type 2 10/06/13   Sherlene Shamseresa L Tullo, MD  NUVARING 0.12-0.015 MG/24HR vaginal ring  02/19/11   Historical Provider, MD  ONE TOUCH ULTRA TEST test strip USE 3 TIMES DAILY TO CHECK BLOOD SUGAR. 10/04/14   Sherlene Shamseresa L Tullo, MD    Allergies as of 01/03/2016 - Review Complete 12/26/2015  Allergen Reaction Noted  . Tetanus toxoids  05/14/2014    Family History  Problem Relation Age of Onset  . Hyperlipidemia Father   . Breast cancer Maternal Aunt 50  . Cancer Maternal Aunt     breast    Social History   Social History  . Marital status: Married    Spouse name: N/A  . Number of children: N/A  . Years of education: N/A   Occupational History  . Not on file.    Social History Main Topics  . Smoking status: Never Smoker  . Smokeless tobacco: Never Used  . Alcohol use No  . Drug use: No  . Sexual activity: Yes   Other Topics Concern  . Not on file   Social History Narrative  . No narrative on file    Review of Systems: See HPI, otherwise negative ROS  Physical Exam: BP (!) 159/71   Pulse 100   Temp 97.8 F (36.6 C) (Tympanic)   Resp 16   Ht 5\' 7"  (1.702 m)   Wt 77.1 kg (170 lb)   SpO2 100%   BMI 26.63 kg/m  General:   Alert,  pleasant and cooperative in NAD Head:  Normocephalic and atraumatic. Neck:  Supple; no masses or thyromegaly. Lungs:  Clear throughout to auscultation.    Heart:  Regular rate and rhythm. Abdomen:  Soft, nontender and nondistended. Normal bowel sounds, without guarding, and without rebound.   Neurologic:  Alert and  oriented x4;  grossly normal neurologically.  Impression/Plan: Melissa Werner is here for an endoscopy and colonoscopy to be performed for iron def anemia  Risks, benefits, limitations, and alternatives regarding  endoscopy and colonoscopy have been reviewed with the patient.  Questions have been answered.  All parties agreeable.   ELLIOTT,  Molly MaduroOBERT, MD  02/03/2016, 7:30 AM

## 2016-02-03 NOTE — Op Note (Signed)
[facility] Gastroenterology Patient Name: Melissa JonesDena Fiala Procedure Date: 02/03/2016 7:32 AM MRN: [id number] Account #: [id number] Date of Birth: 09-Jan-1966 Admit Type: Outpatient Age: [age] [facility] Gender: Female Note Status: Finalized Procedure:            Colonoscopy Indications:          Unexplained iron deficiency anemia Providers:            Scot Junobert T. Zaccheus Edmister, MD Referring MD:         [name], MD (Referring MD) Medicines:            Propofol per Anesthesia Complications:        No immediate complications. Procedure:            Pre-Anesthesia Assessment:                       - After reviewing the risks and benefits, the patient                        was deemed in satisfactory condition to undergo the                        procedure.                       After obtaining informed consent, the colonoscope was                        passed under direct vision. Throughout the procedure,                        the patient's blood pressure, pulse, and oxygen                        saturations were monitored continuously. The                        Colonoscope was introduced through the anus and                        advanced to the the cecum, identified by appendiceal                        orifice and ileocecal valve. The colonoscopy was                        performed without difficulty. The patient tolerated the                        procedure well. The quality of the bowel preparation                        was excellent. Findings:      Internal hemorrhoids were found during endoscopy. The hemorrhoids were       small and Grade I (internal hemorrhoids that do not prolapse).      The exam was otherwise without abnormality. Prep was excellent. Impression:           - Internal hemorrhoids.                       - The examination  was otherwise normal.                       - No specimens collected. Recommendation:       - The findings and  recommendations were discussed with                        the patient's family. Iron supplement needed. follow up                        CBC after 2 months iron oral or infusion. Scot Junobert T Liora Myles, MD 02/03/2016 8:04:16 AM This report has been signed electronically. Number of Addenda: 0 Note Initiated On: 02/03/2016 7:32 AM Scope Withdrawal Time: 0 hours 5 minutes 44 seconds  Total Procedure Duration: 0 hours 9 minutes 32 seconds       [facility]

## 2016-02-03 NOTE — Transfer of Care (Signed)
Immediate Anesthesia Transfer of Care Note  Patient: Melissa Werner  Procedure(s) Performed: Procedure(s): COLONOSCOPY WITH PROPOFOL (N/A) ESOPHAGOGASTRODUODENOSCOPY (EGD) WITH PROPOFOL (N/A)  Patient Location: PACU  Anesthesia Type:General  Level of Consciousness: awake, alert  and oriented  Airway & Oxygen Therapy: Patient Spontanous Breathing and Patient connected to nasal cannula oxygen  Post-op Assessment: Report given to RN and Post -op Vital signs reviewed and stable  Post vital signs: Reviewed and stable  Last Vitals:  Vitals:   02/03/16 0706  BP: (!) 159/71  Pulse: 100  Resp: 16  Temp: 36.6 C    Last Pain:  Vitals:   02/03/16 0706  TempSrc: Tympanic         Complications: No apparent anesthesia complications

## 2016-02-03 NOTE — Anesthesia Preprocedure Evaluation (Signed)
Anesthesia Evaluation  Patient identified by MRN, date of birth, ID band Patient awake    Reviewed: Allergy & Precautions, NPO status , Patient's Chart, lab work & pertinent test results  History of Anesthesia Complications Negative for: history of anesthetic complications  Airway Mallampati: I  TM Distance: >3 FB Neck ROM: Full    Dental no notable dental hx.    Pulmonary neg pulmonary ROS, neg sleep apnea, neg COPD,    breath sounds clear to auscultation- rhonchi (-) wheezing      Cardiovascular Exercise Tolerance: Good (-) hypertension(-) CAD and (-) Past MI  Rhythm:Regular Rate:Normal - Systolic murmurs and - Diastolic murmurs    Neuro/Psych negative neurological ROS  negative psych ROS   GI/Hepatic Neg liver ROS, GERD  ,  Endo/Other  diabetes, Type 2, Insulin Dependent, Oral Hypoglycemic Agents  Renal/GU negative Renal ROS     Musculoskeletal negative musculoskeletal ROS (+)   Abdominal (+) - obese,   Peds  Hematology  (+) anemia ,   Anesthesia Other Findings Past Medical History: No date: Anemia     Comment: iron deficiency No date: Diabetes mellitus No date: GERD (gastroesophageal reflux disease) No date: Hyperlipidemia   Reproductive/Obstetrics                             Anesthesia Physical Anesthesia Plan  ASA: II  Anesthesia Plan: General   Post-op Pain Management:    Induction: Intravenous  Airway Management Planned: Natural Airway  Additional Equipment:   Intra-op Plan:   Post-operative Plan:   Informed Consent: I have reviewed the patients History and Physical, chart, labs and discussed the procedure including the risks, benefits and alternatives for the proposed anesthesia with the patient or authorized representative who has indicated his/her understanding and acceptance.   Dental advisory given  Plan Discussed with: Anesthesiologist and  CRNA  Anesthesia Plan Comments:         Anesthesia Quick Evaluation

## 2016-02-03 NOTE — Anesthesia Postprocedure Evaluation (Signed)
Anesthesia Post Note  Patient: Concepcion ElkDena L Canty  Procedure(s) Performed: Procedure(s) (LRB): COLONOSCOPY WITH PROPOFOL (N/A) ESOPHAGOGASTRODUODENOSCOPY (EGD) WITH PROPOFOL (N/A)  Patient location during evaluation: Endoscopy Anesthesia Type: General Level of consciousness: awake and alert and oriented Pain management: pain level controlled Vital Signs Assessment: post-procedure vital signs reviewed and stable Respiratory status: spontaneous breathing, nonlabored ventilation and respiratory function stable Cardiovascular status: blood pressure returned to baseline and stable Postop Assessment: no signs of nausea or vomiting Anesthetic complications: no    Last Vitals:  Vitals:   02/03/16 0810 02/03/16 0820  BP: (P) 136/84 140/82  Pulse:  83  Resp:  (!) 21  Temp:      Last Pain:  Vitals:   02/03/16 0706  TempSrc: Tympanic                 Kaeson Kleinert

## 2016-02-06 ENCOUNTER — Encounter: Payer: Self-pay | Admitting: Unknown Physician Specialty

## 2016-02-06 LAB — SURGICAL PATHOLOGY

## 2016-03-02 DIAGNOSIS — Z794 Long term (current) use of insulin: Secondary | ICD-10-CM | POA: Diagnosis not present

## 2016-03-02 DIAGNOSIS — E1165 Type 2 diabetes mellitus with hyperglycemia: Secondary | ICD-10-CM | POA: Diagnosis not present

## 2016-03-05 ENCOUNTER — Encounter: Payer: Self-pay | Admitting: Internal Medicine

## 2016-03-05 DIAGNOSIS — D508 Other iron deficiency anemias: Secondary | ICD-10-CM

## 2016-03-05 NOTE — Assessment & Plan Note (Signed)
normal egd and colonoscopy.  continue iron supplements

## 2016-03-19 ENCOUNTER — Other Ambulatory Visit: Payer: Self-pay | Admitting: Internal Medicine

## 2016-03-19 DIAGNOSIS — Z1231 Encounter for screening mammogram for malignant neoplasm of breast: Secondary | ICD-10-CM

## 2016-03-26 ENCOUNTER — Other Ambulatory Visit (INDEPENDENT_AMBULATORY_CARE_PROVIDER_SITE_OTHER): Payer: 59

## 2016-03-26 DIAGNOSIS — D508 Other iron deficiency anemias: Secondary | ICD-10-CM | POA: Diagnosis not present

## 2016-03-26 DIAGNOSIS — M25551 Pain in right hip: Secondary | ICD-10-CM | POA: Diagnosis not present

## 2016-03-26 DIAGNOSIS — S76911A Strain of unspecified muscles, fascia and tendons at thigh level, right thigh, initial encounter: Secondary | ICD-10-CM | POA: Diagnosis not present

## 2016-03-26 LAB — CBC WITH DIFFERENTIAL/PLATELET
BASOS PCT: 1 % (ref 0.0–3.0)
Basophils Absolute: 0.1 10*3/uL (ref 0.0–0.1)
EOS PCT: 2.1 % (ref 0.0–5.0)
Eosinophils Absolute: 0.2 10*3/uL (ref 0.0–0.7)
HEMATOCRIT: 42.9 % (ref 36.0–46.0)
Hemoglobin: 14.1 g/dL (ref 12.0–15.0)
LYMPHS ABS: 2.6 10*3/uL (ref 0.7–4.0)
LYMPHS PCT: 32.9 % (ref 12.0–46.0)
MCHC: 32.8 g/dL (ref 30.0–36.0)
MCV: 81.6 fl (ref 78.0–100.0)
MONOS PCT: 5.4 % (ref 3.0–12.0)
Monocytes Absolute: 0.4 10*3/uL (ref 0.1–1.0)
NEUTROS ABS: 4.6 10*3/uL (ref 1.4–7.7)
NEUTROS PCT: 58.6 % (ref 43.0–77.0)
PLATELETS: 253 10*3/uL (ref 150.0–400.0)
RBC: 5.25 Mil/uL — ABNORMAL HIGH (ref 3.87–5.11)
RDW: 15.9 % — ABNORMAL HIGH (ref 11.5–15.5)
WBC: 7.9 10*3/uL (ref 4.0–10.5)

## 2016-03-26 LAB — FERRITIN: Ferritin: 20 ng/mL (ref 10.0–291.0)

## 2016-03-27 DIAGNOSIS — E1129 Type 2 diabetes mellitus with other diabetic kidney complication: Secondary | ICD-10-CM | POA: Diagnosis not present

## 2016-03-27 DIAGNOSIS — R809 Proteinuria, unspecified: Secondary | ICD-10-CM | POA: Diagnosis not present

## 2016-03-27 DIAGNOSIS — E1165 Type 2 diabetes mellitus with hyperglycemia: Secondary | ICD-10-CM | POA: Diagnosis not present

## 2016-03-27 DIAGNOSIS — Z794 Long term (current) use of insulin: Secondary | ICD-10-CM | POA: Diagnosis not present

## 2016-03-27 LAB — IRON AND TIBC
%SAT: 9 % — ABNORMAL LOW (ref 11–50)
Iron: 41 ug/dL — ABNORMAL LOW (ref 45–160)
TIBC: 432 ug/dL (ref 250–450)
UIBC: 391 ug/dL (ref 125–400)

## 2016-03-29 ENCOUNTER — Encounter: Payer: Self-pay | Admitting: Internal Medicine

## 2016-03-29 ENCOUNTER — Other Ambulatory Visit: Payer: Self-pay | Admitting: Internal Medicine

## 2016-03-29 DIAGNOSIS — D508 Other iron deficiency anemias: Secondary | ICD-10-CM

## 2016-04-03 ENCOUNTER — Ambulatory Visit: Payer: 59 | Attending: Orthopedic Surgery | Admitting: Physical Therapy

## 2016-04-03 ENCOUNTER — Encounter: Payer: Self-pay | Admitting: Physical Therapy

## 2016-04-03 DIAGNOSIS — M6281 Muscle weakness (generalized): Secondary | ICD-10-CM | POA: Insufficient documentation

## 2016-04-03 DIAGNOSIS — R293 Abnormal posture: Secondary | ICD-10-CM | POA: Insufficient documentation

## 2016-04-03 DIAGNOSIS — M25551 Pain in right hip: Secondary | ICD-10-CM | POA: Insufficient documentation

## 2016-04-03 NOTE — Patient Instructions (Signed)
MAT PROGRESSION Supine Transverse Abdominus (T.A) . Lie on your back with your knees bent, feet flat on the floor.  Pull your belly-button inwards towards your spine to activate the deep abdominal musculature.  Hold ____ sec./ ____ repetitions/_____sets.  CAUTION:  If you find yourself holding your breath you may be activating other muscles in the abdomen that do not require strengthening; relax and try again.    Supine Posterior Pelvic Tilt with T.A . Lie on your back with your knees bent, feet flat on the floor.  Pull your belly-button inwards towards your spine to activate the deep abdominal musculature then curl your hips underneath your body so your back is flat against the table.  Hold ____ sec./ ____ repetitions/_____sets.      Supine heel slide with T.A . Lie on your back with your knees bent, feet flat on the floor.  Pull your belly-button inwards towards your spine to activate the deep abdominal musculature.  Slide one heel along the table straightening the knee while keeping your abdominals tight, back flat against the table, and no movement at the hips/pelvis; return to the starting position.  Repeat with the opposite lower extremity.   ____ repetitions/_____sets.         Supine march with T.A . Lie on your back with your knees bent, feet flat on the floor.  Pull your belly-button inwards towards your spine to activate the deep abdominal musculature.  While maintaining your abdominal contraction, slowly lift one leg off the table no more than 3 inches; return to the starting position.  Repeat with the opposite leg.   ____ repetitions/_____sets.       Supine bridge with T.A . Lie on your back with your knees bent, feet flat on the floor.  Pull your belly-button inwards towards your spine to activate the deep abdominal musculature.  Slowly lift your hips and buttocks off the table keeping your abdominal muscles tight.  Hold ___ sec/____reps/____sets  Supine beginning bike with  T.A . Lie on your back with your knees bent, feet flat on the floor.  Pull your belly-button inwards towards your spine to activate the deep abdominal musculature.  Slowly lift the right leg in the bent position, perform a small circle as if riding a bicycle; return the right leg back to the starting position.  Repeat on the opposite leg.   Perform ___ reps/___sets.        Supine straight leg raise with T.A . Lie on your back with one knee bent and the other knee straight.  Pull your belly-button inwards towards your spine to activate the deep abdominal musculature.  While keeping your stomach muscles engaged and back flat on the table, slowly perform a leg lift 4-6 inches off the ground with the straight leg while keeping your stomach muscles tight and back flat on the ground.  Repeat ____ reps/____sets each leg.     Sidelying clamshells with T.A . Lie on your side with both knees bent 90 degrees, the bottom arm under the head, and the top arm on the hip.  Activate your deep abdominal muscles by pulling them inwards towards your spine; while keeping your stomach muscles activated, lift the top knee away from the bottom while keeping your ankles together.  CAUTION: make sure your top hip does NOT roll backwards.  Repeat ____reps/___sets on each leg.    Sidelying hip abduction (bottom leg bent) with T.A . Lie on your side with your top leg straight and your  bottom leg bent; Activate your deep abdominal muscles by pulling them inwards towards your spine.  Slowly lift your top leg up towards the ceiling performing a leg lift while keeping your stomach muscles activated. CAUTION: make sure your body does not roll backwards which may use unwanted muscles during this activity.  Repeat ____reps/____sets on each leg.

## 2016-04-03 NOTE — Therapy (Signed)
[facility] [address] [place], [place], [address] Phone: [phone]   Fax:  [phone]  Physical Therapy Evaluation  Patient Details  Name: Melissa Werner MRN: [id number] Date of Birth: 1965-11-09 Referring Provider: Juanell FairlyKevin Krasinski, MD  Encounter Date: 04/03/2016      PT End of Session - 04/03/16 1724    Visit Number 1   Number of Visits 8   Date for PT Re-Evaluation 05/01/16   Authorization - Visit Number --   Authorization - Number of Visits --   PT Start Time 1643   PT Stop Time 1739   PT Time Calculation (min) 56 min   Activity Tolerance Patient tolerated treatment well;No increased pain   Behavior During Therapy WFL for tasks assessed/performed      Past Medical History:  Diagnosis Date  . Anemia    iron deficiency  . Diabetes mellitus   . GERD (gastroesophageal reflux disease)   . Hyperlipidemia     Past Surgical History:  Procedure Laterality Date  . COLONOSCOPY WITH PROPOFOL N/A 02/03/2016   Procedure: COLONOSCOPY WITH PROPOFOL;  Surgeon: Scot Junobert T Elliott, MD;  [facility];  Service: Endoscopy;  Laterality: N/A;  . ESOPHAGOGASTRODUODENOSCOPY (EGD) WITH PROPOFOL N/A 02/03/2016   Procedure: ESOPHAGOGASTRODUODENOSCOPY (EGD) WITH PROPOFOL;  Surgeon: Scot Junobert T Elliott, MD;  [facility];  Service: Endoscopy;  Laterality: N/A;  . TONSILLECTOMY    . WISDOM TOOTH EXTRACTION      There were no vitals filed for this visit.       Subjective Assessment - 04/03/16 1652    Subjective Pt. presented to PT with 1/10 R hip pain. Pt. keeps active with running outdoors 2x/week with no episodes of hip pain. Pt reports no sleep disturbances from hip pain and has not been limited in ADL's/work from hip pain. She notes that pain is a nagging constant but hasn't limited any aspect of her life.    Pertinent History Pt. presents w/ insidious onset of R hip pain onset August 2017. Pain worsened and pt  received cortizone injection 09/2015 which helped for a little bit. Pt. hasn't had any prior PT or tx for right hip.    Limitations Sitting;Lifting;Standing;Walking   How long can you sit comfortably? needs to change position frequently    Patient Stated Goals Decrease/get rid of R hip pain    Currently in Pain? Yes   Pain Score 1    Pain Location Hip   Pain Orientation Right   Pain Descriptors / Indicators Throbbing   Pain Type Chronic pain   Pain Onset More than a month ago   Pain Frequency Constant   Aggravating Factors  crossing legs   Pain Relieving Factors changing positions   Effect of Pain on Daily Activities none      Pt. Educated on proper techniques to activate TA in supine position and demonstrated understanding. Pt. Issued first 3 pages of core stabilization exercise program (see handouts for detailed list) and instructed to do exercises 3x/week or on non-running days. Pt. Instructed to stop exercises if R hip pain increases with activity.    Lumbar ROM all planes: WNL    Right Left  Hip flexion 3+/5 Pain 4+/5  Hip Abduction 4-/5 Pain 4+/5  Hip Adduction 4-/5 Pain 4+/5  Knee Extension  4-/5 Pain 4/5  Knee Flexion 4-/5 Pain 4+/5  DF 4+/5 5/5  PF 4+/5 5/5           PT Education -  04/03/16 1723    Education provided Yes   Education Details see handout   Person(s) Educated Patient             PT Long Term Goals - 04/03/16 1810      PT LONG TERM GOAL #1   Title Pt. will score 80/80 on LEFS in order to performs ADL's without pain    Baseline 2/6: 74/80   Time 4   Period Weeks   Status New     PT LONG TERM GOAL #2   Title Pt. will have 4+/5 gross R MMT to improve functional strength of RLE.    Baseline Pt. grossly 4-/5 RLE    Time 4   Period Weeks   Status New     PT LONG TERM GOAL #3   Title Pt. will be able to sit with her legs crossed without pain to allow for ADLs such has putting on shoes and socks    Baseline Pt unable to cross legs (either  LE) without pain in R hip   Time 4   Period Weeks   Status New     PT LONG TERM GOAL #4   Title Pt. will be able perform supine marches without R hip pain in order to progress through core stabilization program.   Baseline Pt. currently unable to perform without R hip pain   Time 4   Period Weeks   Status New            Plan - 04/03/16 1740    Clinical Impression Statement Pt. is a pleasant 51 y/o female who presents to physical therapy evaluation with c/o R hip pain since August 2017. Onset of pain is insidious and comes on with positional changes such as sitting with legs crossed.  Pt's pain worsened after onset and received injection to R hip 09/2015 which provided mild relief.  Pt. presented to clinic today with 1/10 resting pain and has 6/10 pain at worst.  Pt. does not currently use any ice or heat on R hip but was instructed on icing R hip to control tenderness/ inflammation.  Pt.'s pain is aggravated by crossing her legs, resisted internal and external rotation of the R hip, resisted R hip flexion, and standing lumbar extension. (-) Scour and Ober's test of R hip. Pt. demonstrates a slight leg length discrepancy with the LLE being slighter longer (L: 90 cm, R: 88 cm) to be addressed at next treatment session.  Patient would benefit from transverse abdominal strengthening and surrounding R leg musculature strengthening for prevention of further chronic pain and alleviation of current symptoms in combination with icing for flare-ups. Pt would benefit from skilled PT in order to decrease R hip pain, strengthen R hip and surround musculature, and improve posture to prevent future episodes of chronic R hip pain.    Rehab Potential Good   Clinical Impairments Affecting Rehab Potential active lifestyle, motivated   PT Frequency 2x / week   PT Duration 4 weeks   PT Treatment/Interventions ADLs/Self Care Home Management;Cryotherapy;Electrical Stimulation;Moist Heat;Gait training;Stair  training;DME Instruction;Therapeutic exercise;Balance training;Therapeutic activities;Neuromuscular re-education;Patient/family education;Orthotic Fit/Training;Manual techniques;Passive range of motion   PT Next Visit Plan METs, address LLD, assess running shoes   PT Home Exercise Plan first 3 pages of core stabilization program    Consulted and Agree with Plan of Care Patient      Patient will benefit from skilled therapeutic intervention in order to improve the following deficits and impairments:  Pain, Postural dysfunction, Decreased  strength, Improper body mechanics, Hypomobility, Decreased mobility, Decreased range of motion, Abnormal gait  Visit Diagnosis: Muscle weakness (generalized)  Pain in right hip  Abnormal posture     Problem List Patient Active Problem List   Diagnosis Date Noted  . Anemia, iron deficiency 11/19/2015  . Hypocalcemia 11/19/2015  . Hospital discharge follow-up 11/19/2015  . Acute right hip pain 09/29/2015  . Overweight (BMI 25.0-29.9) 10/17/2012  . Diabetes mellitus without complication (HCC) 03/22/2011  . Hyperlipidemia   . GERD (gastroesophageal reflux disease)    Cammie Mcgee, PT, DPT # [id number] Madelon Lips, SPT 04/04/2016, 8:29 AM  [facility] [address] [place], [place], [address] Phone: [phone]   Fax:  [phone]  Name: Melissa Werner MRN: [id number] Date of Birth: [birth year]

## 2016-04-05 ENCOUNTER — Ambulatory Visit: Payer: 59 | Admitting: Physical Therapy

## 2016-04-05 DIAGNOSIS — M25551 Pain in right hip: Secondary | ICD-10-CM | POA: Diagnosis not present

## 2016-04-05 DIAGNOSIS — R293 Abnormal posture: Secondary | ICD-10-CM

## 2016-04-05 DIAGNOSIS — M6281 Muscle weakness (generalized): Secondary | ICD-10-CM

## 2016-04-05 NOTE — Therapy (Signed)
[facility] [address] [place], [place], [address] Phone: [phone]   Fax:  [phone]  Physical Therapy Treatment  Patient Details  Name: Melissa Werner MRN: [id number] Date of Birth: 06/28/1965 Referring Provider: [place], MD  Encounter Date: 04/05/2016      PT End of Session - 04/05/16 1611    Visit Number 2   Number of Visits 8   Date for PT Re-Evaluation 05/01/16   PT Start Time 1603   PT Stop Time 1725   PT Time Calculation (min) 82 min   Activity Tolerance Patient tolerated treatment well;No increased pain   Behavior During Therapy WFL for tasks assessed/performed      Past Medical History:  Diagnosis Date  . Anemia    iron deficiency  . Diabetes mellitus   . GERD (gastroesophageal reflux disease)   . Hyperlipidemia     Past Surgical History:  Procedure Laterality Date  . COLONOSCOPY WITH PROPOFOL N/A 02/03/2016   Procedure: COLONOSCOPY WITH PROPOFOL;  Surgeon: Manya Silvas, MD;  [facility] END[facility]COPY;  Service: Endoscopy;  Laterality: N/A;  . ESOPHAGOGASTRODUODENOSCOPY (EGD) WITH PROPOFOL N/A 02/03/2016   Procedure: ESOPHAGOGASTRODUODENOSCOPY (EGD) WITH PROPOFOL;  Surgeon: Manya Silvas, MD;  Location: [facility] ENDOSCOPY;  Service: Endoscopy;  Laterality: N/A;  . TONSILLECTOMY    . WISDOM TOOTH EXTRACTION      There were no vitals filed for this visit.      Subjective Assessment - 04/05/16 1610    Subjective Pt. reports no c/o pain at rest upon arrival to PT and says that she was able to complete her HEP and run yesterday without any aggravation of R hip pain.    Pertinent History Pt. presents w/ insidious onset of R hip pain onset August 2017. Pain worsened and pt received cortizone injection 09/2015 which helped for a little bit. Pt. hasn't had any prior PT or tx for right hip.    Limitations Sitting;Lifting;Standing;Walking   How long can you sit comfortably? needs to change position  frequently    Patient Stated Goals Decrease/get rid of R hip pain    Currently in Pain? No/denies     OBJECTIVE: Manual tx: supine muscle energy technique 5x 10 sec, pt. Provided resistance into R hamstring contraction w/ simultaneous L hip flexor resistance during contraction. R hipShotgun MET 4x. Noted improved symmetry and decreased leg length discrepancy. R hip AP mobilization 4x1 min   Ambulated between interventions to assess carryover.  Heel lift into right shoe  Therex: reviewed HEP.   Nautilus: seated hip adduction 3x10.  Marching 1x 30 second Walking ~40 ft x 5 trials. Nustep 10 min lvl 6 (warmup -no charge) 5 forward, 5 backwards TA contractions 4x20 seconds TA contraction heel slides 1x10. Education on feeling TA  Neuromuscular: Standing on bosu ball no UE support, 2x 60 seconds Airex pad: standing 1x30 sec, eyes closed 1x60 sec, marching 2x60 sec, single leg balance 1x30 sec D2 LE pattern in supine 1x8. Completely passive   Cryotherapy (10 minutes)         PT Long Term Goals - 04/03/16 1810      PT LONG TERM GOAL #1   Title Pt. will score 80/80 on LEFS in order to performs ADL's without pain    Baseline 2/6: 74/80   Time 4   Period Weeks   Status New     PT LONG TERM GOAL #2   Title Pt. will have 4+/5 gross R MMT  to improve functional strength of RLE.    Baseline Pt. grossly 4-/5 RLE    Time 4   Period Weeks   Status New     PT LONG TERM GOAL #3   Title Pt. will be able to sit with her legs crossed without pain to allow for ADLs such has putting on shoes and socks    Baseline Pt unable to cross legs (either LE) without pain in R hip   Time 4   Period Weeks   Status New     PT LONG TERM GOAL #4   Title Pt. will be able perform supine marches without R hip pain in order to progress through core stabilization program.   Baseline Pt. currently unable to perform without R hip pain   Time 4   Period Weeks   Status New        Patient will  benefit from skilled therapeutic intervention in order to improve the following deficits and impairments:  Pain, Postural dysfunction, Decreased strength, Improper body mechanics, Hypomobility, Decreased mobility, Decreased range of motion, Abnormal gait  Visit Diagnosis: Muscle weakness (generalized)  Pain in right hip  Abnormal posture     Problem List Patient Active Problem List   Diagnosis Date Noted  . Anemia, iron deficiency 11/19/2015  . Hypocalcemia 11/19/2015  . Hospital discharge follow-up 11/19/2015  . Acute right hip pain 09/29/2015  . Overweight (BMI 25.0-29.9) 10/17/2012  . Diabetes mellitus without complication ([place]) 61/51/8343  . Hyperlipidemia   . GERD (gastroesophageal reflux disease)    Melissa Werner, PT, DPT # [id number], SPT Melissa Werner, SPT 04/06/2016, 8:17 AM  [place] [address] [place], [place], [address] Phone: [phone]   Fax:  [phone]  Name: Melissa Werner MRN: [id number] Date of Birth: 1965/05/29

## 2016-04-10 ENCOUNTER — Ambulatory Visit: Payer: 59 | Admitting: Physical Therapy

## 2016-04-10 DIAGNOSIS — M25551 Pain in right hip: Secondary | ICD-10-CM

## 2016-04-10 DIAGNOSIS — M6281 Muscle weakness (generalized): Secondary | ICD-10-CM

## 2016-04-10 DIAGNOSIS — R293 Abnormal posture: Secondary | ICD-10-CM | POA: Diagnosis not present

## 2016-04-10 NOTE — Therapy (Signed)
[facility] [address] [place], [place], [address] Phone: [phone]   Fax:  [phone]  Physical Therapy Treatment  Patient Details  Name: Melissa Werner MRN: [id number] Date of Birth: [birth year] Referring Provider: Juanell Fairly, MD  Encounter Date: 04/10/2016      PT End of Session - 04/10/16 1742    Visit Number 3   Number of Visits 8   Date for PT Re-Evaluation 05/01/16   PT Start Time 1628   PT Stop Time 1727   PT Time Calculation (min) 59 min   Activity Tolerance Patient tolerated treatment well;No increased pain   Behavior During Therapy WFL for tasks assessed/performed      Past Medical History:  Diagnosis Date  . Anemia    iron deficiency  . Diabetes mellitus   . GERD (gastroesophageal reflux disease)   . Hyperlipidemia     Past Surgical History:  Procedure Laterality Date  . COLONOSCOPY WITH PROPOFOL N/A 02/03/2016   Procedure: COLONOSCOPY WITH PROPOFOL;  Surgeon: Scot Jun, MD;  Location: [facility] ENDOSCOPY;  Service: Endoscopy;  Laterality: N/A;  . ESOPHAGOGASTRODUODENOSCOPY (EGD) WITH PROPOFOL N/A 02/03/2016   Procedure: ESOPHAGOGASTRODUODENOSCOPY (EGD) WITH PROPOFOL;  Surgeon: Scot Jun, MD;  [facility] ENDOSCOPY;  Service: Endoscopy;  Laterality: N/A;  . TONSILLECTOMY    . WISDOM TOOTH EXTRACTION      There were no vitals filed for this visit.      Subjective Assessment - 04/10/16 1731    Subjective Pt. reports that she ran on friday with her lift in her shoe and no pain. Patient has not noticed heel lift in shoe, been consistantly icing, and performing HEP. Patient reports only one incidence of pain since last visit occuring yesterday with 1/10 pain.    Pertinent History Pt. presents w/ insidious onset of R hip pain onset August 2017. Pain worsened and pt received cortizone injection 09/2015 which helped for a little bit. Pt. hasn't had any prior PT or tx for right hip.    Limitations Sitting;Lifting;Standing;Walking   How long can you sit comfortably? needs to change position frequently    Patient Stated Goals Decrease/get rid of R hip pain    Currently in Pain? No/denies     Ther Ex: Nautilus: forward 2x 80lb, sideways 2x each side 60-70 lb, backwards 2x 60 lb. Required cues for body mechanics.  Monster walks 4x ~60 ft with cues for weight shifting and body mechanics Side squat walk 6x ~42ft with cues for depth of squat. Seated Marching on theraball. Cues for abdominal control RDL Squat 5lb x 5 times. Cues for body mechanics and sequencing.    Manual: RLE hip PA mobilizations. 3x60 seconds in prone position with towel under quad. Decreased leg length discrepancy after checking pre-post.  Manual distraction RLE with belt 3x30 seconds in supine position at 15 degree angle. Caused a little soreness afterwards.  RLE manual abduction distraction with gait belt and AAROM flexion extension of hip/knee 3x30 seconds in supine position.  RLE: PROM: Piriformis stretch,  Hip IR , ER , and figure four stretch 1x20 seconds. Pt demonstrated understanding of figure four stretch in supine and will incorporate it into HEP.  Knees to chest stretch, Pt. Reports it felt good.   Cryotherapy post session (no charge)         PT Education - 04/10/16 1742    Education provided Yes   Education Details figure four stretch in supine to perform at home.  Person(s) Educated Patient   Methods Explanation;Demonstration   Comprehension Verbalized understanding;Returned demonstration             PT Long Term Goals - 04/03/16 1810      PT LONG TERM GOAL #1   Title Pt. will score 80/80 on LEFS in order to performs ADL's without pain    Baseline 2/6: 74/80   Time 4   Period Weeks   Status New     PT LONG TERM GOAL #2   Title Pt. will have 4+/5 gross R MMT to improve functional strength of RLE.    Baseline Pt. grossly 4-/5 RLE    Time 4   Period Weeks   Status New      PT LONG TERM GOAL #3   Title Pt. will be able to sit with her legs crossed without pain to allow for ADLs such has putting on shoes and socks    Baseline Pt unable to cross legs (either LE) without pain in R hip   Time 4   Period Weeks   Status New     PT LONG TERM GOAL #4   Title Pt. will be able perform supine marches without R hip pain in order to progress through core stabilization program.   Baseline Pt. currently unable to perform without R hip pain   Time 4   Period Weeks   Status New           Plan - 04/10/16 1743    Clinical Impression Statement Patient presented to physical therapy after wearing heel lift during her run this weekend.  After PA mobilizations patient has decreased leg length discrepancy. Patient reported soreness after manual distraction but none during the distraction. Patient had decreased discrepancy after manual mobilizations with belt.  Piriformis, gluteals, and adductor regions continue to be tight and patient was educated on figure four stretch to implement at home. Patient demonstrated good body mechanics during strengthening exercises and required minimal cueing for upright posture in squat position. Patient would benefit from continued skilled physical therapy to control pain, improve function,  strengthen, postural alignment, and improved ambulatory mechanics.    Rehab Potential Good   Clinical Impairments Affecting Rehab Potential active lifestyle, motivated   PT Frequency 2x / week   PT Duration 4 weeks   PT Treatment/Interventions ADLs/Self Care Home Management;Cryotherapy;Electrical Stimulation;Moist Heat;Gait training;Stair training;DME Instruction;Therapeutic exercise;Balance training;Therapeutic activities;Neuromuscular re-education;Patient/family education;Orthotic Fit/Training;Manual techniques;Passive range of motion   PT Next Visit Plan METs, assess heel lift in shoe over weekend.    PT Home Exercise Plan first 3 pages of core  stabilization program    Consulted and Agree with Plan of Care Patient      Patient will benefit from skilled therapeutic intervention in order to improve the following deficits and impairments:  Pain, Postural dysfunction, Decreased strength, Improper body mechanics, Hypomobility, Decreased mobility, Decreased range of motion, Abnormal gait  Visit Diagnosis: Muscle weakness (generalized)  Pain in right hip  Abnormal posture     Problem List Patient Active Problem List   Diagnosis Date Noted  . Anemia, iron deficiency 11/19/2015  . Hypocalcemia 11/19/2015  . Hospital discharge follow-up 11/19/2015  . Acute right hip pain 09/29/2015  . Overweight (BMI 25.0-29.9) 10/17/2012  . Diabetes mellitus without complication (HCC) 03/22/2011  . Hyperlipidemia   . GERD (gastroesophageal reflux disease)    Cammie Mcgee, PT, DPT # [id number] Precious Bard, SPT 04/11/2016, 11:40 AM  [place] [address] [place], [place], [address] Phone: [phone]   Fax:  [phone]  Name: Melissa Werner MRN: [id number] Date of Birth: [birth year]

## 2016-04-12 ENCOUNTER — Ambulatory Visit: Payer: 59 | Admitting: Physical Therapy

## 2016-04-12 DIAGNOSIS — M25551 Pain in right hip: Secondary | ICD-10-CM | POA: Diagnosis not present

## 2016-04-12 DIAGNOSIS — R293 Abnormal posture: Secondary | ICD-10-CM

## 2016-04-12 DIAGNOSIS — M6281 Muscle weakness (generalized): Secondary | ICD-10-CM | POA: Diagnosis not present

## 2016-04-12 NOTE — Therapy (Signed)
[facility] [address] [place], [place], [address] Phone: [phone]   Fax:  [phone]  Physical Therapy Treatment  Patient Details  Name: Melissa Werner MRN: [id number] Date of Birth: [birth year] Referring Provider: Thornton Park, MD  Encounter Date: 04/12/2016      PT End of Session - 04/12/16 1745    Visit Number 4   Number of Visits 8   Date for PT Re-Evaluation 05/01/16   PT Start Time 1627   PT Stop Time 7591   PT Time Calculation (min) 49 min   Activity Tolerance Patient tolerated treatment well;No increased pain   Behavior During Therapy WFL for tasks assessed/performed      Past Medical History:  Diagnosis Date  . Anemia    iron deficiency  . Diabetes mellitus   . GERD (gastroesophageal reflux disease)   . Hyperlipidemia     Past Surgical History:  Procedure Laterality Date  . COLONOSCOPY WITH PROPOFOL N/A 02/03/2016   Procedure: COLONOSCOPY WITH PROPOFOL;  Surgeon: Manya Silvas, MD;  Location: [facility] ENDOSCOPY;  Service: Endoscopy;  Laterality: N/A;  . ESOPHAGOGASTRODUODENOSCOPY (EGD) WITH PROPOFOL N/A 02/03/2016   Procedure: ESOPHAGOGASTRODUODENOSCOPY (EGD) WITH PROPOFOL;  Surgeon: Manya Silvas, MD;  Location: [facility] ENDOSCOPY;  Service: Endoscopy;  Laterality: N/A;  . TONSILLECTOMY    . WISDOM TOOTH EXTRACTION      There were no vitals filed for this visit.      Subjective Assessment - 04/12/16 1737    Subjective Patient reports having 1/10 pain yesterday and the day before. She has not been wearing her heel lift in her work shoes and is thinking about joining an "abs and butt" class.    Pertinent History Pt. presents w/ insidious onset of R hip pain onset August 2017. Pain worsened and pt received cortizone injection 09/2015 which helped for a little bit. Pt. hasn't had any prior PT or tx for right hip.    Limitations Sitting;Lifting;Standing;Walking   How long can you sit comfortably? needs to  change position frequently    Patient Stated Goals Decrease/get rid of R hip pain    Currently in Pain? No/denies       TherEx: TA contractions 5x10 sec PPT with UE alternating reaches 2x10 PPT with TA contraction marches. No pain. Patient educated to tighten core prior to marching to reduce discomfort  Manual:  supine muscle energy technique 5x 10 sec, pt. Provided resistance into R hamstring contraction w/ simultaneous L hip flexor resistance during contraction. Noted improved symmetry and decreased leg length discrepancy. R hip PA and AP mobilization 5x1 min  Distraction R hip 5x 60 seconds. Helped neutralize leg length discrepancy to ~ equal length.    Pt. Response to medical necessity: Patient will benefit from continued therapy to promote neutral hip alignment for pain control and strengthening to reduce chronicity.       PT Education - 04/12/16 1739    Education provided Yes   Education Details MET at home with stick.    Person(s) Educated Patient   Methods Explanation;Demonstration;Handout   Comprehension Verbalized understanding;Returned demonstration             PT Long Term Goals - 04/03/16 1810      PT LONG TERM GOAL #1   Title Pt. will score 80/80 on LEFS in order to performs ADL's without pain    Baseline 2/6: 74/80   Time 4   Period Weeks   Status New  PT LONG TERM GOAL #2   Title Pt. will have 4+/5 gross R MMT to improve functional strength of RLE.    Baseline Pt. grossly 4-/5 RLE    Time 4   Period Weeks   Status New     PT LONG TERM GOAL #3   Title Pt. will be able to sit with her legs crossed without pain to allow for ADLs such has putting on shoes and socks    Baseline Pt unable to cross legs (either LE) without pain in R hip   Time 4   Period Weeks   Status New     PT LONG TERM GOAL #4   Title Pt. will be able perform supine marches without R hip pain in order to progress through core stabilization program.   Baseline Pt. currently  unable to perform without R hip pain   Time 4   Period Weeks   Status New               Plan - 04/12/16 1745    Clinical Impression Statement Patient presented to physical therapy session today with no pain although was noted to have pain between sessions. Leg length discrepancy was measured to be 1.5 cm difference between right and leg prior to treatment.  After multiple rounds of R hip PA mobilizations and distractions pt's leg length was corrected and within .10 cm of each other.  Patient performed abdominal exercises with corrected bony alignment and had noted improvement. Patient was educated and demonstrated understanding performing MET at home for HEP. Patient will benefit from continued skilled physical therapy to progress function, postural alignment, control pain, and return to previous activity.    Rehab Potential Good   Clinical Impairments Affecting Rehab Potential active lifestyle, motivated   PT Frequency 2x / week   PT Duration 4 weeks   PT Treatment/Interventions ADLs/Self Care Home Management;Cryotherapy;Electrical Stimulation;Moist Heat;Gait training;Stair training;DME Instruction;Therapeutic exercise;Balance training;Therapeutic activities;Neuromuscular re-education;Patient/family education;Orthotic Fit/Training;Manual techniques;Passive range of motion   PT Next Visit Plan METs, distraction, PA mobs   PT Home Exercise Plan first 3 pages of core stabilization program    Consulted and Agree with Plan of Care Patient      Patient will benefit from skilled therapeutic intervention in order to improve the following deficits and impairments:  Pain, Postural dysfunction, Decreased strength, Improper body mechanics, Hypomobility, Decreased mobility, Decreased range of motion, Abnormal gait  Visit Diagnosis: Muscle weakness (generalized)  Pain in right hip  Abnormal posture     Problem List Patient Active Problem List   Diagnosis Date Noted  . Anemia, iron  deficiency 11/19/2015  . Hypocalcemia 11/19/2015  . Hospital discharge follow-up 11/19/2015  . Acute right hip pain 09/29/2015  . Overweight (BMI 25.0-29.9) 10/17/2012  . Diabetes mellitus without complication ([place]) 51/03/5850  . Hyperlipidemia   . GERD (gastroesophageal reflux disease)    Pura Spice, PT, DPT # [place], SPT 04/12/2016, 5:51 PM  [place] [facility] [address] [place], [place], [address] Phone: [phone]   Fax:  [phone]  Name: Melissa Werner MRN: [id number] Date of Birth: 20-Oct-1965

## 2016-04-13 ENCOUNTER — Ambulatory Visit: Payer: 59

## 2016-04-17 ENCOUNTER — Ambulatory Visit: Payer: 59 | Admitting: Physical Therapy

## 2016-04-17 ENCOUNTER — Encounter: Payer: Self-pay | Admitting: Physical Therapy

## 2016-04-17 DIAGNOSIS — R293 Abnormal posture: Secondary | ICD-10-CM

## 2016-04-17 DIAGNOSIS — M25551 Pain in right hip: Secondary | ICD-10-CM

## 2016-04-17 DIAGNOSIS — M6281 Muscle weakness (generalized): Secondary | ICD-10-CM

## 2016-04-17 NOTE — Therapy (Signed)
[place] [facility] [address] [place], [place], [address] Phone: [phone]   Fax:  [phone]  Physical Therapy Treatment  Patient Details  Name: Melissa Werner MRN: [id number] Date of Birth: 15-Aug-1965 Referring Provider: Juanell Fairly, MD  Encounter Date: 04/17/2016      PT End of Session - 04/17/16 1743    Visit Number 5   Number of Visits 8   Date for PT Re-Evaluation 05/01/16   PT Start Time 1629   PT Stop Time 1715   PT Time Calculation (min) 46 min   Activity Tolerance Patient tolerated treatment well;No increased pain   Behavior During Therapy WFL for tasks assessed/performed      Past Medical History:  Diagnosis Date  . Anemia    iron deficiency  . Diabetes mellitus   . GERD (gastroesophageal reflux disease)   . Hyperlipidemia     Past Surgical History:  Procedure Laterality Date  . COLONOSCOPY WITH PROPOFOL N/A 02/03/2016   Procedure: COLONOSCOPY WITH PROPOFOL;  Surgeon: Scot Jun, MD;  Location: [facility] ENDOSCOPY;  Service: Endoscopy;  Laterality: N/A;  . ESOPHAGOGASTRODUODENOSCOPY (EGD) WITH PROPOFOL N/A 02/03/2016   Procedure: ESOPHAGOGASTRODUODENOSCOPY (EGD) WITH PROPOFOL;  Surgeon: Scot Jun, MD;  Location: [facility] ENDOSCOPY;  Service: Endoscopy;  Laterality: N/A;  . TONSILLECTOMY    . WISDOM TOOTH EXTRACTION      There were no vitals filed for this visit.      Subjective Assessment - 04/17/16 1741    Subjective Patient has not had any increased pain since last session and is feeling an improvement. Patient continues to wear her heel lift during runs. She continues to have 1/10 pain.     Pertinent History Pt. presents w/ insidious onset of R hip pain onset August 2017. Pain worsened and pt received cortizone injection 09/2015 which helped for a little bit. Pt. hasn't had any prior PT or tx for right hip.    Limitations Sitting;Lifting;Standing;Walking   How long can you sit comfortably? needs to  change position frequently    Patient Stated Goals Decrease/get rid of R hip pain    Currently in Pain? Yes   Pain Score 1    Pain Location Hip   Pain Orientation Right   Pain Descriptors / Indicators Throbbing   Pain Type Chronic pain     TherEx: TA contractions 2x5 seconds.  Dead bugs 2x10 with knees at 90 90.  Marching with core contraction and shoulder stabilization with wooden dowel. 2x10  Nautilus weighted walking 3x each direction. Forward (70lb), Sideways -each direction (60lb), Backwards (60lb).  Squat pauses 5x with 10 second holds at bottom of squat at parallel bars.  Side squat shuffle x 6 at // bars with cues for depth of squat. Side steps ascending with right descending with left x3. No pain. Single leg stabilization on bosu ball in // bars 2x 30 seconds with occasional UE support.   Manual: supine muscle energy technique. Patient demonstrated compliance with HEP technique. Noted improved symmetry and decreased leg length discrepancy. R hip PA and AP mobilization 5x1 min. Towel under hamstring in prone.  Distraction R hip 5x 60 seconds in prone. Helped neutralize leg length discrepancy to ~ equal length.   Pt. Response to medical necessity: Patient will benefit from continued therapy to promote neutral hip alignment for pain control and strengthening to reduce chronicity.        PT Long Term Goals - 04/03/16 1810  PT LONG TERM GOAL #1   Title Pt. will score 80/80 on LEFS in order to performs ADL's without pain    Baseline 2/6: 74/80   Time 4   Period Weeks   Status New     PT LONG TERM GOAL #2   Title Pt. will have 4+/5 gross R MMT to improve functional strength of RLE.    Baseline Pt. grossly 4-/5 RLE    Time 4   Period Weeks   Status New     PT LONG TERM GOAL #3   Title Pt. will be able to sit with her legs crossed without pain to allow for ADLs such has putting on shoes and socks    Baseline Pt unable to cross legs (either LE) without pain in R  hip   Time 4   Period Weeks   Status New     PT LONG TERM GOAL #4   Title Pt. will be able perform supine marches without R hip pain in order to progress through core stabilization program.   Baseline Pt. currently unable to perform without R hip pain   Time 4   Period Weeks   Status New            Plan - 04/17/16 1743    Clinical Impression Statement Patient presented to physical therapy with improved leg length symmetry. Leg length was equal after manual techniques were implemented. Patient is improving with abdominal control and is progressing through core stabilization. Pain was not increased in session today although patient notes that there is a consistent 1/10 pain.  Patient had good body mechanics throughout challenging tasks. We will continue to work on strengthening patient in proper alignment to decrease chance of chronicity. Patient will benefit from continued skilled physical therapy to decrease pain, strengthen in proper bony alignment, and return patient to daily life.    Rehab Potential Good   Clinical Impairments Affecting Rehab Potential active lifestyle, motivated   PT Frequency 2x / week   PT Duration 4 weeks   PT Treatment/Interventions ADLs/Self Care Home Management;Cryotherapy;Electrical Stimulation;Moist Heat;Gait training;Stair training;DME Instruction;Therapeutic exercise;Balance training;Therapeutic activities;Neuromuscular re-education;Patient/family education;Orthotic Fit/Training;Manual techniques;Passive range of motion   PT Next Visit Plan METs, distraction, PA mobs   PT Home Exercise Plan first 3 pages of core stabilization program    Consulted and Agree with Plan of Care Patient      Patient will benefit from skilled therapeutic intervention in order to improve the following deficits and impairments:  Pain, Postural dysfunction, Decreased strength, Improper body mechanics, Hypomobility, Decreased mobility, Decreased range of motion, Abnormal  gait  Visit Diagnosis: Muscle weakness (generalized)  Pain in right hip  Abnormal posture     Problem List Patient Active Problem List   Diagnosis Date Noted  . Anemia, iron deficiency 11/19/2015  . Hypocalcemia 11/19/2015  . Hospital discharge follow-up 11/19/2015  . Acute right hip pain 09/29/2015  . Overweight (BMI 25.0-29.9) 10/17/2012  . Diabetes mellitus without complication ([facility]) 03/22/2011  . Hyperlipidemia   . GERD (gastroesophageal reflux disease)    Cammie McgeeMichael C Sherk, PT, DPT # [id number] Precious BardMarina Farran [place], SPT 04/18/2016, 8:45 AM  [facility] [address] [place], [place], [address] Phone: [phone]   Fax:  [phone]  Name: Melissa Werner MRN: [id number] Date of Birth: 1965/12/04

## 2016-04-18 DIAGNOSIS — Z01419 Encounter for gynecological examination (general) (routine) without abnormal findings: Secondary | ICD-10-CM | POA: Diagnosis not present

## 2016-04-18 DIAGNOSIS — Z3044 Encounter for surveillance of vaginal ring hormonal contraceptive device: Secondary | ICD-10-CM | POA: Diagnosis not present

## 2016-04-18 DIAGNOSIS — Z1239 Encounter for other screening for malignant neoplasm of breast: Secondary | ICD-10-CM | POA: Diagnosis not present

## 2016-04-18 DIAGNOSIS — N938 Other specified abnormal uterine and vaginal bleeding: Secondary | ICD-10-CM | POA: Diagnosis not present

## 2016-04-19 ENCOUNTER — Ambulatory Visit: Payer: 59 | Admitting: Physical Therapy

## 2016-04-19 DIAGNOSIS — M6281 Muscle weakness (generalized): Secondary | ICD-10-CM

## 2016-04-19 DIAGNOSIS — R293 Abnormal posture: Secondary | ICD-10-CM

## 2016-04-19 DIAGNOSIS — M25551 Pain in right hip: Secondary | ICD-10-CM

## 2016-04-19 NOTE — Therapy (Signed)
[place] [facility] [address] [place], [place], [address] Phone: [phone]   Fax:  [phone]  Physical Therapy Treatment  Patient Details  Name: Melissa Werner MRN: [id number] Date of Birth: Jun 12, 1965 Referring Provider: Thornton Park, MD  Encounter Date: 04/19/2016      PT End of Session - 04/19/16 1730    Visit Number 6   Number of Visits 8   Date for PT Re-Evaluation 05/01/16   PT Start Time 1622   PT Stop Time 1711   PT Time Calculation (min) 49 min   Activity Tolerance Patient tolerated treatment well;No increased pain   Behavior During Therapy WFL for tasks assessed/performed      Past Medical History:  Diagnosis Date  . Anemia    iron deficiency  . Diabetes mellitus   . GERD (gastroesophageal reflux disease)   . Hyperlipidemia     Past Surgical History:  Procedure Laterality Date  . COLONOSCOPY WITH PROPOFOL N/A 02/03/2016   Procedure: COLONOSCOPY WITH PROPOFOL;  Surgeon: Manya Silvas, MD;  Location: [facility] ENDOSCOPY;  Service: Endoscopy;  Laterality: N/A;  . ESOPHAGOGASTRODUODENOSCOPY (EGD) WITH PROPOFOL N/A 02/03/2016   Procedure: ESOPHAGOGASTRODUODENOSCOPY (EGD) WITH PROPOFOL;  Surgeon: Manya Silvas, MD;  Location: [facility] ENDOSCOPY;  Service: Endoscopy;  Laterality: N/A;  . TONSILLECTOMY    . WISDOM TOOTH EXTRACTION      There were no vitals filed for this visit.      Subjective Assessment - 04/19/16 1721    Subjective Patient is feeling decreased pain and ran yesterday. Patient feeling better and having less incidences of pain with crossing legs   Pertinent History Pt. presents w/ insidious onset of R hip pain onset August 2017. Pain worsened and pt received cortizone injection 09/2015 which helped for a little bit. Pt. hasn't had any prior PT or tx for right hip.    Limitations Sitting;Lifting;Standing;Walking   How long can you sit comfortably? needs to change position frequently    Patient Stated  Goals Decrease/get rid of R hip pain    Currently in Pain? Yes   Pain Score 1    Pain Location Hip   Pain Orientation Right   Pain Descriptors / Indicators Aching      TherEx: TA contractions 2x5 seconds.  Dead bugs with theraball and UE/LE 2x10 with knees at 36 90. Some R groin pain. Marching with core contraction and shoulder stabilization with wooden dowel. 1x10  Theraball TA contraction scapular punches 1x10 Nautilus seated adduction 40lb 3x10 each leg. PT hold right foot into neutral position.  Squat pauses 10x with 5 second holds at bottom of squat at parallel bars.  Pulse squat at parallel bars 10x 5 pulses.  Squat lateral walks on ladder with RTB 2x. Cues for right foot to remain neutral -tendency to externally rotate Single leg stabilization on trampoline in // bars 2x 30 seconds with occasional UE support.   Manual: supine muscle energy technique 5x 10 sec.Provided resistance into R hamstring contraction w/ simultaneous L hip flexor resistance during contraction.  Noted improved symmetry and decreased leg length discrepancy. R hip PA and AP mobilization 5x1 min. Towel under hamstring in prone.  Distraction R hip 5x 60 seconds in prone. Helped neutralize leg length discrepancy to ~ equal length.  R hipShotgun MET 4x. Noted improved symmetry and decreased leg length discrepancy. Piriformis, Abductor, and Hamstring PROM to threshold prior to pain 2x30 seconds.   Pt. Response to medical necessity: Patient will benefit from  continued therapy to promote neutral hip alignment for pain control and strengthening to reduce chronicity.        PT Long Term Goals - 04/03/16 1810      PT LONG TERM GOAL #1   Title Pt. will score 80/80 on LEFS in order to performs ADL's without pain    Baseline 2/6: 74/80   Time 4   Period Weeks   Status New     PT LONG TERM GOAL #2   Title Pt. will have 4+/5 gross R MMT to improve functional strength of RLE.    Baseline Pt. grossly 4-/5 RLE     Time 4   Period Weeks   Status New     PT LONG TERM GOAL #3   Title Pt. will be able to sit with her legs crossed without pain to allow for ADLs such has putting on shoes and socks    Baseline Pt unable to cross legs (either LE) without pain in R hip   Time 4   Period Weeks   Status New     PT LONG TERM GOAL #4   Title Pt. will be able perform supine marches without R hip pain in order to progress through core stabilization program.   Baseline Pt. currently unable to perform without R hip pain   Time 4   Period Weeks   Status New               Plan - 04/19/16 1731    Clinical Impression Statement Patient presented with improved symmetery of leg length and decreased pain intensity and frequency. Leg length was more symmetrical this visit and became equal after manual interventions. Patient was able to reach and partially cross midline with PROM without pain, pain arriving slightly after midline was crossed. Abdominal control is progressing with tasks. Right foot has a tendency to externally rotate and requires frequent cueing for neutral position. Strengthening will continue to be progressed to decrease chronicity likelihood. Patient will benefit from continued skilled physical therapy to decrease pain, strengthen in proper bony alignment, and return patient to daily life.    Rehab Potential Good   Clinical Impairments Affecting Rehab Potential active lifestyle, motivated   PT Frequency 2x / week   PT Duration 4 weeks   PT Treatment/Interventions ADLs/Self Care Home Management;Cryotherapy;Electrical Stimulation;Moist Heat;Gait training;Stair training;DME Instruction;Therapeutic exercise;Balance training;Therapeutic activities;Neuromuscular re-education;Patient/family education;Orthotic Fit/Training;Manual techniques;Passive range of motion   PT Next Visit Plan potential decrease to 1x per week   PT Home Exercise Plan first 3 pages of core stabilization program    Consulted and  Agree with Plan of Care Patient      Patient will benefit from skilled therapeutic intervention in order to improve the following deficits and impairments:  Pain, Postural dysfunction, Decreased strength, Improper body mechanics, Hypomobility, Decreased mobility, Decreased range of motion, Abnormal gait  Visit Diagnosis: Muscle weakness (generalized)  Pain in right hip  Abnormal posture     Problem List Patient Active Problem List   Diagnosis Date Noted  . Anemia, iron deficiency 11/19/2015  . Hypocalcemia 11/19/2015  . Hospital discharge follow-up 11/19/2015  . Acute right hip pain 09/29/2015  . Overweight (BMI 25.0-29.9) 10/17/2012  . Diabetes mellitus without complication ([place]) 73/22/0254  . Hyperlipidemia   . GERD (gastroesophageal reflux disease)    Pura Spice, PT, DPT # [place], SPT 04/20/2016, 4:05 PM  [place] [facility] [address] [place], [place], [address] Phone: [phone]  Fax:  [phone]  Name: Melissa Werner MRN: [id number] Date of Birth: Sep 24, 1965

## 2016-04-24 ENCOUNTER — Ambulatory Visit: Payer: 59 | Admitting: Physical Therapy

## 2016-04-24 DIAGNOSIS — R293 Abnormal posture: Secondary | ICD-10-CM | POA: Diagnosis not present

## 2016-04-24 DIAGNOSIS — M6281 Muscle weakness (generalized): Secondary | ICD-10-CM

## 2016-04-24 DIAGNOSIS — M25551 Pain in right hip: Secondary | ICD-10-CM

## 2016-04-24 NOTE — Therapy (Signed)
[facility] [address] [place], [place], [address] Phone: [phone]   Fax:  [phone]  Physical Therapy Treatment  Patient Details  Name: Melissa Werner MRN: [id number] Date of Birth: 1966-01-27 Referring Provider: Thornton Park, MD  Encounter Date: 04/24/2016      PT End of Session - 04/24/16 1735    Visit Number 7   Number of Visits 8   Date for PT Re-Evaluation 05/01/16   PT Start Time 1627   PT Stop Time 1721   PT Time Calculation (min) 54 min   Activity Tolerance Patient tolerated treatment well;No increased pain   Behavior During Therapy WFL for tasks assessed/performed      Past Medical History:  Diagnosis Date  . Anemia    iron deficiency  . Diabetes mellitus   . GERD (gastroesophageal reflux disease)   . Hyperlipidemia     Past Surgical History:  Procedure Laterality Date  . COLONOSCOPY WITH PROPOFOL N/A 02/03/2016   Procedure: COLONOSCOPY WITH PROPOFOL;  Surgeon: Manya Silvas, MD;  Location: [facility] ENDOSCOPY;  Service: Endoscopy;  Laterality: N/A;  . ESOPHAGOGASTRODUODENOSCOPY (EGD) WITH PROPOFOL N/A 02/03/2016   Procedure: ESOPHAGOGASTRODUODENOSCOPY (EGD) WITH PROPOFOL;  Surgeon: Manya Silvas, MD;  Location: [facility] ENDOSCOPY;  Service: Endoscopy;  Laterality: N/A;  . TONSILLECTOMY    . WISDOM TOOTH EXTRACTION      There were no vitals filed for this visit.      Subjective Assessment - 04/24/16 1731    Subjective Patient is having less pain today. She gardened this weekend and had increased pain during and after gardening but it was reduced after icing.    Pertinent History Pt. presents w/ insidious onset of R hip pain onset August 2017. Pain worsened and pt received cortizone injection 09/2015 which helped for a little bit. Pt. hasn't had any prior PT or tx for right hip.    Limitations Sitting;Lifting;Standing;Walking   How long can you sit comfortably? needs to change position frequently    Patient Stated Goals Decrease/get rid of R hip pain    Currently in Pain? No/denies      Neuro Re-ed Reverse bosu standing balance 1x60 seconds. Verbal cues for body mechanics and tightened core. Step up crane on bosu 10x each leg. Cues for upright posture and sequencing. Squat down pick up ball from floor, return to standing throw ball to trampoline catch and return back to floor 10x. Compensation for legs with excessive trunk flexion  TherEx: TA contraction 2x5 seconds TA contraction marching 1x10, educated on proper body mechanics Hip flexor supine stretch 1x30 seconds each leg Bridge with marching 1x10. Cues for abdominal control Bridging on therex ball 1x10. Min A for ball control Wall squats 1x10 cues for feet placement Standing stair stretch for hip flexors and hamstrings 30 seconds each position x 5  Manual: R hip PA and AP mobilization 5x1 min. Towel under hamstring in prone.  Distraction R hip 5x 60 seconds in prone. Helped neutralize leg length discrepancy to ~ equal length.  Piriformis, Abductor, and Hamstring PROM to threshold prior to pain 2x60 seconds.   Pt. Response to medical necessity: Patient will benefit from continued therapy to promote neutral hip alignment for pain control and strengthening to reduce chronicity.           PT Education - 04/24/16 1732    Education provided Yes   Education Details new HEP + MET  PT Long Term Goals - 04/03/16 1810      PT LONG TERM GOAL #1   Title Pt. will score 80/80 on LEFS in order to performs ADL's without pain    Baseline 2/6: 74/80   Time 4   Period Weeks   Status New     PT LONG TERM GOAL #2   Title Pt. will have 4+/5 gross R MMT to improve functional strength of RLE.    Baseline Pt. grossly 4-/5 RLE    Time 4   Period Weeks   Status New     PT LONG TERM GOAL #3   Title Pt. will be able to sit with her legs crossed without pain to allow for ADLs such has putting on shoes and socks     Baseline Pt unable to cross legs (either LE) without pain in R hip   Time 4   Period Weeks   Status New     PT LONG TERM GOAL #4   Title Pt. will be able perform supine marches without R hip pain in order to progress through core stabilization program.   Baseline Pt. currently unable to perform without R hip pain   Time 4   Period Weeks   Status New            Plan - 04/24/16 1736    Clinical Impression Statement Patient presents with decreased pain in right hip. Adduction of right hip continues to be limited with some progress made to allow patient to cross ankles in supine without pain. Pt. still unable to cross legs in seated without pain. Hamstrings were tight bilaterally and post stretching the patient had increased ability to adduct legs.  Balance continues to progress with pt. stabilizing on reverse bosu ball with tactile and verbal cues to maintain balance with frequent shaking/ inability to remain still. Pt. has difficulty reaching to pick up object from floor with proper squat technique and compensates with trunk flexion. Leg length was symmetrical this session and she continues to progress through core programming. New HEP was prescribed and pt. demonstrated understanding. Patient will benefit from continued skilled physical therapy to decrease pain, strengthen in proper bony alignment, and allow for return to previous activities   Rehab Potential Good   Clinical Impairments Affecting Rehab Potential active lifestyle, motivated   PT Frequency 2x / week   PT Duration 4 weeks   PT Treatment/Interventions ADLs/Self Care Home Management;Cryotherapy;Electrical Stimulation;Moist Heat;Gait training;Stair training;DME Instruction;Therapeutic exercise;Balance training;Therapeutic activities;Neuromuscular re-education;Patient/family education;Orthotic Fit/Training;Manual techniques;Passive range of motion   PT Next Visit Plan review HEP, decide if discharge, LEFS   PT Home Exercise Plan  first 3 pages of core stabilization program    Consulted and Agree with Plan of Care Patient      Patient will benefit from skilled therapeutic intervention in order to improve the following deficits and impairments:  Pain, Postural dysfunction, Decreased strength, Improper body mechanics, Hypomobility, Decreased mobility, Decreased range of motion, Abnormal gait  Visit Diagnosis: Muscle weakness (generalized)  Pain in right hip  Abnormal posture     Problem List Patient Active Problem List   Diagnosis Date Noted  . Anemia, iron deficiency 11/19/2015  . Hypocalcemia 11/19/2015  . Hospital discharge follow-up 11/19/2015  . Acute right hip pain 09/29/2015  . Overweight (BMI 25.0-29.9) 10/17/2012  . Diabetes mellitus without complication ([place]) 65/46/5035  . Hyperlipidemia   . GERD (gastroesophageal reflux disease)    Pura Spice, PT, DPT # [place], SPT 04/25/2016,  7:26 AM   [facility] [address] [place], [place], [address] Phone: [phone]   Fax:  [phone]  Name: Melissa Werner MRN: [id number] Date of Birth: 02/27/1965

## 2016-04-26 ENCOUNTER — Ambulatory Visit: Payer: 59 | Attending: Orthopedic Surgery | Admitting: Physical Therapy

## 2016-04-26 DIAGNOSIS — R293 Abnormal posture: Secondary | ICD-10-CM | POA: Insufficient documentation

## 2016-04-26 DIAGNOSIS — M25551 Pain in right hip: Secondary | ICD-10-CM | POA: Insufficient documentation

## 2016-04-26 DIAGNOSIS — M6281 Muscle weakness (generalized): Secondary | ICD-10-CM | POA: Insufficient documentation

## 2016-04-26 NOTE — Therapy (Signed)
[facility] [address] [place], [place], [address] Phone: [phone]   Fax:  [phone]  Physical Therapy Treatment  Patient Details  Name: Melissa Werner MRN: [id number] Date of Birth: Nov 02, 1965 Referring Provider: Thornton Park, MD  Encounter Date: 04/26/2016      PT End of Session - 04/26/16 1721    Visit Number 8   Number of Visits 8   Date for PT Re-Evaluation 05/01/16   PT Start Time 1627   PT Stop Time 7867   PT Time Calculation (min) 49 min   Activity Tolerance Patient tolerated treatment well;No increased pain   Behavior During Therapy WFL for tasks assessed/performed      Past Medical History:  Diagnosis Date  . Anemia    iron deficiency  . Diabetes mellitus   . GERD (gastroesophageal reflux disease)   . Hyperlipidemia     Past Surgical History:  Procedure Laterality Date  . COLONOSCOPY WITH PROPOFOL N/A 02/03/2016   Procedure: COLONOSCOPY WITH PROPOFOL;  Surgeon: Manya Silvas, MD;  Location: [facility] ENDOSCOPY;  Service: Endoscopy;  Laterality: N/A;  . ESOPHAGOGASTRODUODENOSCOPY (EGD) WITH PROPOFOL N/A 02/03/2016   Procedure: ESOPHAGOGASTRODUODENOSCOPY (EGD) WITH PROPOFOL;  Surgeon: Manya Silvas, MD;  Location: [facility] ENDOSCOPY;  Service: Endoscopy;  Laterality: N/A;  . TONSILLECTOMY    . WISDOM TOOTH EXTRACTION      There were no vitals filed for this visit.      Subjective Assessment - 04/26/16 1719    Subjective Patient attended glutes and abs class yesterday and was pleased, she reports having to compensate for a few exercises but with no pain. Pt has had minor pain between sessions, primarily just times of discomfort.    Pertinent History Pt. presents w/ insidious onset of R hip pain onset August 2017. Pain worsened and pt received cortizone injection 09/2015 which helped for a little bit. Pt. hasn't had any prior PT or tx for right hip.    Limitations Sitting;Lifting;Standing;Walking   How  long can you sit comfortably? needs to change position frequently    Patient Stated Goals Decrease/get rid of R hip pain    Currently in Pain? No/denies      Manual: R hip PA and AP mobilization 5x1 min. Towel under hamstring in prone.  Distraction R hip 5x 60 seconds in prone. Helped neutralize leg length discrepancy to ~ equal length.  Piriformis, Abductor, hip flexor,and Hamstring PROM to threshold prior to pain 2x60 seconds.    Neuro Re-ed:  Obstacle course: step up and over 6" step, over two 3" steps, lateral cone taps x6, stand on airex pad cone tap x10 each leg, stand on airex pad cone reach 10x each arm. Airex pad marching 1x60 seconds  TherEx:  #5 lb ankle weights hip extension 10x each leg, abduction 10x each leg, flexion (slow speed large amplitude) x10, hamstring curl x10 each leg, heel and toe raises 10x Quadruped fire hydrant 1x5 for form correction for class. Half kneeling hip flexor and hamstring stretch 30 second each position Lunge stair ascension (2 steps at a time) x3.  Hip flexor stretch in standing with back leg elevated 1x60 seconds Crossing legs right over left, left over right   Pt. Response to medical necessity: if pain managed throughout trip patient will progress towards discharge.        PT Long Term Goals - 04/26/16 1734      PT LONG TERM GOAL #1   Title Pt. will score  80/80 on LEFS in order to performs ADL's without pain    Baseline 2/6: 74/80   Time 4   Period Weeks   Status New     PT LONG TERM GOAL #2   Title Pt. will have 4+/5 gross R MMT to improve functional strength of RLE.    Baseline 04/26/16: 4+/5 MMT   Time 4   Period Weeks   Status Achieved     PT LONG TERM GOAL #3   Title Pt. will be able to sit with her legs crossed without pain to allow for ADLs such has putting on shoes and socks    Baseline Can cross right over left without pain, cross left over right with discomfort    Time 4   Period Weeks   Status Partially Met      PT LONG TERM GOAL #4   Title Pt. will be able perform supine marches without R hip pain in order to progress through core stabilization program.   Baseline Able to perform    Time 4   Period Weeks   Status Achieved            Plan - 04/26/16 1734    Clinical Impression Statement Patient presents to physical therapy after completing her first "butt and abs" class with no pain. Pt. has less incidences of pain in right hip and pt. was able to cross right leg over left without pain but had mild discomfort crossing left over right. Hip musculature was tight and relieved discomfort of positions post stretching. Balance continues to progress with single leg and unstable surfaces. PA  and AP mobilizations combined with distraction was relieving. Pt. had no noticeable leg length.  HEP was reviewed and understood. Patient status was discussed, if pain is managed well over trip next week the patient will be discharged from PT. If pain is consistent and preventative of activities PT will continue.    Rehab Potential Good   Clinical Impairments Affecting Rehab Potential active lifestyle, motivated   PT Frequency 2x / week   PT Duration 4 weeks   PT Treatment/Interventions ADLs/Self Care Home Management;Cryotherapy;Electrical Stimulation;Moist Heat;Gait training;Stair training;DME Instruction;Therapeutic exercise;Balance training;Therapeutic activities;Neuromuscular re-education;Patient/family education;Orthotic Fit/Training;Manual techniques;Passive range of motion   PT Next Visit Plan discharge?   PT Home Exercise Plan first 3 pages of core stabilization program    Consulted and Agree with Plan of Care Patient      Patient will benefit from skilled therapeutic intervention in order to improve the following deficits and impairments:  Pain, Postural dysfunction, Decreased strength, Improper body mechanics, Hypomobility, Decreased mobility, Decreased range of motion, Abnormal gait  Visit  Diagnosis: Muscle weakness (generalized)  Pain in right hip  Abnormal posture     Problem List Patient Active Problem List   Diagnosis Date Noted  . Anemia, iron deficiency 11/19/2015  . Hypocalcemia 11/19/2015  . Hospital discharge follow-up 11/19/2015  . Acute right hip pain 09/29/2015  . Overweight (BMI 25.0-29.9) 10/17/2012  . Diabetes mellitus without complication ([facility] [place] [facility] [place]) 40/98/1191  . Hyperlipidemia   . GERD (gastroesophageal reflux disease)    Pura Spice, PT, DPT # [place], SPT 04/27/2016, 3:14 PM  [facility] [address] [place], [place], [address] Phone: [phone]   Fax:  [phone]  Name: Melissa Werner MRN: [id number] Date of Birth: May 11, 1965

## 2016-04-27 ENCOUNTER — Encounter: Payer: Self-pay | Admitting: Physical Therapy

## 2016-05-01 ENCOUNTER — Encounter: Payer: Self-pay | Admitting: Physical Therapy

## 2016-05-03 ENCOUNTER — Encounter: Payer: Self-pay | Admitting: Physical Therapy

## 2016-05-08 ENCOUNTER — Encounter: Payer: Self-pay | Admitting: Physical Therapy

## 2016-05-09 ENCOUNTER — Ambulatory Visit: Payer: Self-pay | Admitting: Internal Medicine

## 2016-05-10 ENCOUNTER — Ambulatory Visit
Admission: RE | Admit: 2016-05-10 | Discharge: 2016-05-10 | Disposition: A | Discharge status: Home / Self Care | Payer: 59 | Source: Ambulatory Visit | Attending: Internal Medicine | Admitting: Internal Medicine

## 2016-05-10 DIAGNOSIS — Z1231 Encounter for screening mammogram for malignant neoplasm of breast: Secondary | ICD-10-CM | POA: Diagnosis not present

## 2016-06-14 ENCOUNTER — Other Ambulatory Visit (INDEPENDENT_AMBULATORY_CARE_PROVIDER_SITE_OTHER): Payer: 59

## 2016-06-14 DIAGNOSIS — D508 Other iron deficiency anemias: Secondary | ICD-10-CM | POA: Diagnosis not present

## 2016-06-14 LAB — CBC WITH DIFFERENTIAL/PLATELET
BASOS PCT: 0.9 % (ref 0.0–3.0)
Basophils Absolute: 0.1 10*3/uL (ref 0.0–0.1)
EOS PCT: 0.8 % (ref 0.0–5.0)
Eosinophils Absolute: 0.1 10*3/uL (ref 0.0–0.7)
HCT: 43.9 % (ref 36.0–46.0)
Hemoglobin: 14.2 g/dL (ref 12.0–15.0)
LYMPHS ABS: 4.5 10*3/uL — AB (ref 0.7–4.0)
Lymphocytes Relative: 38 % (ref 12.0–46.0)
MCHC: 32.3 g/dL (ref 30.0–36.0)
MCV: 83.9 fl (ref 78.0–100.0)
Monocytes Absolute: 0.7 10*3/uL (ref 0.1–1.0)
Monocytes Relative: 6 % (ref 3.0–12.0)
NEUTROS PCT: 54.3 % (ref 43.0–77.0)
Neutro Abs: 6.5 10*3/uL (ref 1.4–7.7)
PLATELETS: 295 10*3/uL (ref 150.0–400.0)
RBC: 5.24 Mil/uL — ABNORMAL HIGH (ref 3.87–5.11)
RDW: 13.7 % (ref 11.5–15.5)
WBC: 12 10*3/uL — ABNORMAL HIGH (ref 4.0–10.5)

## 2016-06-14 LAB — FERRITIN: Ferritin: 15.7 ng/mL (ref 10.0–291.0)

## 2016-06-14 LAB — IRON AND TIBC
%SAT: 17 % (ref 11–50)
IRON: 74 ug/dL (ref 45–160)
TIBC: 442 ug/dL (ref 250–450)
UIBC: 368 ug/dL (ref 125–400)

## 2016-06-17 ENCOUNTER — Encounter: Payer: Self-pay | Admitting: Internal Medicine

## 2016-06-18 ENCOUNTER — Encounter: Payer: Self-pay | Admitting: Internal Medicine

## 2016-06-20 ENCOUNTER — Ambulatory Visit: Payer: Self-pay | Admitting: Internal Medicine

## 2016-06-21 DIAGNOSIS — Z794 Long term (current) use of insulin: Secondary | ICD-10-CM | POA: Diagnosis not present

## 2016-06-21 DIAGNOSIS — E1165 Type 2 diabetes mellitus with hyperglycemia: Secondary | ICD-10-CM | POA: Diagnosis not present

## 2016-06-26 ENCOUNTER — Encounter: Payer: Self-pay | Admitting: *Deleted

## 2016-06-26 DIAGNOSIS — E1129 Type 2 diabetes mellitus with other diabetic kidney complication: Secondary | ICD-10-CM | POA: Diagnosis not present

## 2016-06-26 DIAGNOSIS — Z794 Long term (current) use of insulin: Secondary | ICD-10-CM | POA: Diagnosis not present

## 2016-06-26 DIAGNOSIS — R809 Proteinuria, unspecified: Secondary | ICD-10-CM | POA: Diagnosis not present

## 2016-06-26 DIAGNOSIS — E1165 Type 2 diabetes mellitus with hyperglycemia: Secondary | ICD-10-CM | POA: Diagnosis not present

## 2016-06-27 ENCOUNTER — Encounter: Payer: Self-pay | Admitting: Internal Medicine

## 2016-06-27 ENCOUNTER — Ambulatory Visit (INDEPENDENT_AMBULATORY_CARE_PROVIDER_SITE_OTHER): Payer: 59 | Admitting: Internal Medicine

## 2016-06-27 VITALS — BP 116/74 | HR 89 | Temp 98.5°F | Resp 16 | Ht 67.0 in | Wt 178.4 lb

## 2016-06-27 DIAGNOSIS — D508 Other iron deficiency anemias: Secondary | ICD-10-CM | POA: Diagnosis not present

## 2016-06-27 DIAGNOSIS — E663 Overweight: Secondary | ICD-10-CM | POA: Diagnosis not present

## 2016-06-27 DIAGNOSIS — E1169 Type 2 diabetes mellitus with other specified complication: Secondary | ICD-10-CM | POA: Diagnosis not present

## 2016-06-27 DIAGNOSIS — E785 Hyperlipidemia, unspecified: Secondary | ICD-10-CM | POA: Diagnosis not present

## 2016-06-27 NOTE — Progress Notes (Signed)
Subjective:  Patient ID: Melissa Werner, female    DOB: 05-03-1965  Age: 51 y.o. MRN: [id number]  CC: The primary encounter diagnosis was Hyperlipidemia associated with type 2 diabetes mellitus (HCC). Diagnoses of Other iron deficiency anemia and Overweight (BMI 25.0-29.9) were also pertinent to this visit.  HPI Melissa Werner presents for FOLLOW UP ON iron deficiency anemia and overweight.   Anemia:  requiring transfusion . f undetermined etiology. She feels .  She feels generally well , has not taken iron in several months. colonoscopy and EGD were done in December .  gastritis found.  iopsyies negative  Overweight:  has had difficulty maintaining the  weight lss and has gained 8 lbs since last OV.  She is exercising 2-3 times per week and has an increased appetite; "I like to eat."      Lab Results  Component Value Date   WBC 12.0 (H) 06/14/2016   HGB 14.2 06/14/2016   HCT 43.9 06/14/2016   MCV 83.9 06/14/2016   PLT 295.0 06/14/2016   Lab Results  Component Value Date   FERRITIN 15.7 06/14/2016     Outpatient Medications Prior to Visit  Medication Sig Dispense Refill  . glipiZIDE (GLUCOTROL) 10 MG tablet Take 10 mg by mouth daily before breakfast.     . glucose blood test strip Use once daily to check cbg   DM type 2 100 each 1  . Insulin Glargine (LANTUS SOLOSTAR) 100 UNIT/ML Solostar Pen Take 20 units subcutaneously each night    . metformin (FORTAMET) 1000 MG (OSM) 24 hr tablet Take 2,000 mg by mouth daily with breakfast.    . NUVARING 0.12-0.015 MG/24HR vaginal ring     . omeprazole (PRILOSEC) 20 MG capsule Take 20 mg by mouth daily.    . ONE TOUCH ULTRA TEST test strip USE 3 TIMES DAILY TO CHECK BLOOD SUGAR. 100 each 2  . ferrous sulfate 325 (65 FE) MG tablet Take 1 tablet (325 mg total) by mouth 2 (two) times daily with a meal. (Patient not taking: Reported on 06/27/2016) 60 tablet 0   No facility-administered medications prior to visit.     Review of  Systems;  Patient denies headache, fevers, malaise, unintentional weight loss, skin rash, eye pain, sinus congestion and sinus pain, sore throat, dysphagia,  hemoptysis , cough, dyspnea, wheezing, chest pain, palpitations, orthopnea, edema, abdominal pain, nausea, melena, diarrhea, constipation, flank pain, dysuria, hematuria, urinary  Frequency, nocturia, numbness, tingling, seizures,  Focal weakness, Loss of consciousness,  Tremor, insomnia, depression, anxiety, and suicidal ideation.      Objective:  BP 116/74 (BP Location: Left Arm, Patient Position: Sitting, Cuff Size: Normal)   Pulse 89   Temp 98.5 F (36.9 C) (Oral)   Resp 16   Ht  (1.702 m)   Wt 178 lb 6.4 oz (80.9 kg)   BMI 27.94 kg/m   BP Readings from Last 3 Encounters:  06/27/16 116/74  02/03/16 140/82  12/26/15 140/90    Wt Readings from Last 3 Encounters:  06/27/16 178 lb 6.4 oz (80.9 kg)  02/03/16 170 lb (77.1 kg)  12/26/15 174 lb (78.9 kg)    General appearance: alert, cooperative and appears stated age Ears: normal TM's and external ear canals both ears Throat: lips, mucosa, and tongue normal; teeth and gums normal Neck: no adenopathy, no carotid bruit, supple, symmetrical, trachea midline and thyroid not enlarged, symmetric, no tenderness/mass/nodules Back: symmetric, no curvature. ROM normal. No CVA tenderness. Lungs: clear to auscultation  bilaterally Heart: regular rate and rhythm, S1, S2 normal, no murmur, click, rub or gallop Abdomen: soft, non-tender; bowel sounds normal; no masses,  no organomegaly Pulses: 2+ and symmetric Skin: Skin color, texture, turgor normal. No rashes or lesions Lymph nodes: Cervical, supraclavicular, and axillary nodes normal.  Lab Results  Component Value Date   HGBA1C 8.5 (H) 11/09/2015   HGBA1C 7.3 03/22/2015   HGBA1C 7.3 (A) 08/25/2014    Lab Results  Component Value Date   CREATININE 0.61 11/18/2015   CREATININE 0.54 11/11/2015   CREATININE 0.68 11/10/2015     Lab Results  Component Value Date   WBC 12.0 (H) 06/14/2016   HGB 14.2 06/14/2016   HCT 43.9 06/14/2016   PLT 295.0 06/14/2016   GLUCOSE 197 (H) 11/18/2015   CHOL 133 11/09/2015   TRIG 259.0 (H) 11/09/2015   HDL 45.20 11/09/2015   LDLDIRECT 58.0 11/09/2015   LDLCALC 51 11/25/2014   ALT 11 (L) 11/10/2015   AST 19 11/10/2015   NA 136 11/18/2015   K 4.0 11/18/2015   CL 101 11/18/2015   CREATININE 0.61 11/18/2015   BUN 10 11/18/2015   CO2 26 11/18/2015   TSH 3.02 11/25/2014   INR 1.00 11/10/2015   HGBA1C 8.5 (H) 11/09/2015   MICROALBUR <0.7 11/09/2015    Mm Screening Breast Tomo Bilateral  Result Date: 05/11/2016 CLINICAL DATA:  Screening. EXAM: 2D DIGITAL SCREENING BILATERAL MAMMOGRAM WITH CAD AND ADJUNCT TOMO COMPARISON:  Previous exam(s). ACR Breast Density Category c: The breast tissue is heterogeneously dense, which may obscure small masses. FINDINGS: There are no findings suspicious for malignancy. Images were processed with CAD. IMPRESSION: No mammographic evidence of malignancy. A result letter of this screening mammogram will be mailed directly to the patient. RECOMMENDATION: Screening mammogram in one year. (Code:SM-B-01Y) BI-RADS CATEGORY  1: Negative. Electronically Signed   By: Frederico Hamman M.D.   On: 05/11/2016 10:20    Assessment & Plan:   Problem List Items Addressed This Visit    Anemia, iron deficiency    Presumed secondary to gastritis resulting in decreased absorption.  s/p transfusion  and oral iron. . repeat CBC normal.  Repeat in 6 months .  Lab Results  Component Value Date   WBC 12.0 (H) 06/14/2016   HGB 14.2 06/14/2016   HCT 43.9 06/14/2016   MCV 83.9 06/14/2016   PLT 295.0 06/14/2016         Relevant Orders   CBC with Differential/Platelet   Ferritin   IBC panel   Overweight (BMI 25.0-29.9)    I have addressed  BMI and recommended wt loss of 10% of body weigh over the next 6 months using a low glycemic index diet and regular  exercise a minimum of 5 days per week.         Other Visit Diagnoses    Hyperlipidemia associated with type 2 diabetes mellitus (HCC)    -  Primary   Relevant Medications   liraglutide (VICTOZA) 18 MG/3ML SOPN   Other Relevant Orders   Lipid panel      I am having Ms. Brunkhorst maintain her NUVARING, glucose blood, omeprazole, ONE TOUCH ULTRA TEST, Insulin Glargine, glipiZIDE, ferrous sulfate, metformin, and liraglutide.  Meds ordered this encounter  Medications  . liraglutide (VICTOZA) 18 MG/3ML SOPN    Sig: Inject 1.8 mg into the skin daily.    There are no discontinued medications.  Follow-up: Return in about 6 months (around 12/28/2016), or fasting labs 3 months .  Crecencio Mc, MD

## 2016-06-27 NOTE — Progress Notes (Signed)
Pre visit review using our clinic review tool, if applicable. No additional management support is needed unless otherwise documented below in the visit note. 

## 2016-06-27 NOTE — Patient Instructions (Signed)
NO IRON anymore   RETURN  IN 3 MONTHS FOR FASTING LABS ONLY,    SEE YOU IN 6

## 2016-06-29 NOTE — Assessment & Plan Note (Signed)
Presumed secondary to gastritis resulting in decreased absorption.  s/p transfusion  and oral iron. . repeat CBC normal.  Repeat in 6 months .  Lab Results  Component Value Date   WBC 12.0 (H) 06/14/2016   HGB 14.2 06/14/2016   HCT 43.9 06/14/2016   MCV 83.9 06/14/2016   PLT 295.0 06/14/2016

## 2016-06-29 NOTE — Assessment & Plan Note (Signed)
I have addressed  BMI and recommended wt loss of 10% of body weigh over the next 6 months using a low glycemic index diet and regular exercise a minimum of 5 days per week.   

## 2016-09-27 DIAGNOSIS — E119 Type 2 diabetes mellitus without complications: Secondary | ICD-10-CM | POA: Diagnosis not present

## 2016-09-27 LAB — MICROALBUMIN, URINE: Microalb, Ur: 30

## 2016-09-27 LAB — HEMOGLOBIN A1C: HEMOGLOBIN A1C: 7.9

## 2016-09-27 LAB — BASIC METABOLIC PANEL
BUN: 8 (ref 4–21)
Creatinine: 0.7 (ref 0.5–1.1)
SODIUM: 135 — AB (ref 137–147)

## 2016-10-02 ENCOUNTER — Other Ambulatory Visit (INDEPENDENT_AMBULATORY_CARE_PROVIDER_SITE_OTHER): Payer: 59

## 2016-10-02 DIAGNOSIS — D508 Other iron deficiency anemias: Secondary | ICD-10-CM | POA: Diagnosis not present

## 2016-10-02 DIAGNOSIS — E1169 Type 2 diabetes mellitus with other specified complication: Secondary | ICD-10-CM

## 2016-10-02 DIAGNOSIS — E785 Hyperlipidemia, unspecified: Secondary | ICD-10-CM | POA: Diagnosis not present

## 2016-10-02 LAB — IBC PANEL
IRON: 32 ug/dL — AB (ref 42–145)
Saturation Ratios: 5.8 % — ABNORMAL LOW (ref 20.0–50.0)
TRANSFERRIN: 395 mg/dL — AB (ref 212.0–360.0)

## 2016-10-02 LAB — CBC WITH DIFFERENTIAL/PLATELET
BASOS PCT: 1 % (ref 0.0–3.0)
Basophils Absolute: 0.1 10*3/uL (ref 0.0–0.1)
EOS PCT: 2.5 % (ref 0.0–5.0)
Eosinophils Absolute: 0.2 10*3/uL (ref 0.0–0.7)
HCT: 42.3 % (ref 36.0–46.0)
Hemoglobin: 13.6 g/dL (ref 12.0–15.0)
LYMPHS ABS: 3.4 10*3/uL (ref 0.7–4.0)
Lymphocytes Relative: 37.4 % (ref 12.0–46.0)
MCHC: 32.2 g/dL (ref 30.0–36.0)
MCV: 81.4 fl (ref 78.0–100.0)
Monocytes Absolute: 0.6 10*3/uL (ref 0.1–1.0)
Monocytes Relative: 6.1 % (ref 3.0–12.0)
NEUTROS ABS: 4.9 10*3/uL (ref 1.4–7.7)
NEUTROS PCT: 53 % (ref 43.0–77.0)
PLATELETS: 281 10*3/uL (ref 150.0–400.0)
RBC: 5.2 Mil/uL — ABNORMAL HIGH (ref 3.87–5.11)
RDW: 13.2 % (ref 11.5–15.5)
WBC: 9.2 10*3/uL (ref 4.0–10.5)

## 2016-10-02 LAB — LIPID PANEL
CHOL/HDL RATIO: 4
Cholesterol: 183 mg/dL (ref 0–200)
HDL: 51.8 mg/dL (ref >39.00)
NONHDL: 131.15
Triglycerides: 224 mg/dL — ABNORMAL HIGH (ref 0.0–149.0)
VLDL: 44.8 mg/dL — ABNORMAL HIGH (ref 0.0–40.0)

## 2016-10-02 LAB — LDL CHOLESTEROL, DIRECT: LDL DIRECT: 94 mg/dL

## 2016-10-02 LAB — FERRITIN: FERRITIN: 7.3 ng/mL — AB (ref 10.0–291.0)

## 2016-10-03 DIAGNOSIS — E1129 Type 2 diabetes mellitus with other diabetic kidney complication: Secondary | ICD-10-CM | POA: Diagnosis not present

## 2016-10-03 DIAGNOSIS — R809 Proteinuria, unspecified: Secondary | ICD-10-CM | POA: Diagnosis not present

## 2016-10-03 DIAGNOSIS — Z794 Long term (current) use of insulin: Secondary | ICD-10-CM | POA: Diagnosis not present

## 2016-12-31 DIAGNOSIS — H5203 Hypermetropia, bilateral: Secondary | ICD-10-CM | POA: Diagnosis not present

## 2016-12-31 LAB — HM DIABETES EYE EXAM

## 2017-01-02 ENCOUNTER — Ambulatory Visit (INDEPENDENT_AMBULATORY_CARE_PROVIDER_SITE_OTHER): Payer: 59 | Admitting: Internal Medicine

## 2017-01-02 ENCOUNTER — Encounter: Payer: Self-pay | Admitting: Internal Medicine

## 2017-01-02 VITALS — BP 138/80 | HR 104 | Temp 98.3°F | Resp 15 | Ht 67.0 in | Wt 178.0 lb

## 2017-01-02 DIAGNOSIS — R030 Elevated blood-pressure reading, without diagnosis of hypertension: Secondary | ICD-10-CM

## 2017-01-02 DIAGNOSIS — D508 Other iron deficiency anemias: Secondary | ICD-10-CM

## 2017-01-02 DIAGNOSIS — E611 Iron deficiency: Secondary | ICD-10-CM

## 2017-01-02 DIAGNOSIS — E119 Type 2 diabetes mellitus without complications: Secondary | ICD-10-CM

## 2017-01-02 DIAGNOSIS — K219 Gastro-esophageal reflux disease without esophagitis: Secondary | ICD-10-CM | POA: Diagnosis not present

## 2017-01-02 DIAGNOSIS — J0100 Acute maxillary sinusitis, unspecified: Secondary | ICD-10-CM

## 2017-01-02 DIAGNOSIS — E782 Mixed hyperlipidemia: Secondary | ICD-10-CM | POA: Diagnosis not present

## 2017-01-02 MED ORDER — PREDNISONE 10 MG PO TABS
ORAL_TABLET | ORAL | 0 refills | Status: DC
Start: 2017-01-02 — End: 2018-07-30

## 2017-01-02 MED ORDER — AMOXICILLIN-POT CLAVULANATE 875-125 MG PO TABS: 1.0000 | ORAL_TABLET | Freq: Two times a day (BID) | ORAL | 0 refills | Status: DC

## 2017-01-02 NOTE — Progress Notes (Signed)
Subjective:  Patient ID: Melissa Werner, female    DOB: Jul 08, 1965  Age: 51 y.o. MRN: [id number]  CC: The primary encounter diagnosis was Other iron deficiency anemia. Diagnoses of Mixed hyperlipidemia, Dietary iron deficiency without anemia, Acute maxillary sinusitis, recurrence not specified, Gastroesophageal reflux disease without esophagitis, Diabetes mellitus without complication (HCC), and Elevated blood pressure reading without diagnosis of hypertension were also pertinent to this visit.  HPI Melissa Werner presents for follow up on hypertension , hyperlipidemia , and IDA  Iron stores were low in August and a daily oral  iron supplement was advised. She has been taking one daily without nausea or constipation    Had a colonoscopy in Oct 2017 .  internal hemorrhoids found .  EGD: minimal gastritis  Cold symptoms started yesterday  Sore throat and stuffy nose   Patient is taking her medications as prescribed and notes no adverse effects.  Home BP readings have been done about once per month but not in the last 3 months  .  She is avoiding added salt in her diet and walking regularly about 5 times per week for exercise  .    Outpatient Medications Prior to Visit  Medication Sig Dispense Refill  . ferrous sulfate 325 (65 FE) MG tablet Take 1 tablet (325 mg total) by mouth 2 (two) times daily with a meal. 60 tablet 0  . glipiZIDE (GLUCOTROL) 10 MG tablet Take 10 mg by mouth daily before breakfast.     . glucose blood test strip Use once daily to check cbg   DM type 2 100 each 1  . Insulin Glargine (LANTUS SOLOSTAR) 100 UNIT/ML Solostar Pen Take 20 units subcutaneously each night    . liraglutide (VICTOZA) 18 MG/3ML SOPN Inject 1.8 mg into the skin daily.    . metformin (FORTAMET) 1000 MG (OSM) 24 hr tablet Take 2,000 mg by mouth daily with breakfast.    . NUVARING 0.12-0.015 MG/24HR vaginal ring     . omeprazole (PRILOSEC) 20 MG capsule Take 20 mg by mouth daily.    . ONE TOUCH  ULTRA TEST test strip USE 3 TIMES DAILY TO CHECK BLOOD SUGAR. 100 each 2   No facility-administered medications prior to visit.     Review of Systems;  Patient denies headache, fevers, malaise, unintentional weight loss, skin rash, eye pain, sinus congestion and sinus pain, sore throat, dysphagia,  hemoptysis , cough, dyspnea, wheezing, chest pain, palpitations, orthopnea, edema, abdominal pain, nausea, melena, diarrhea, constipation, flank pain, dysuria, hematuria, urinary  Frequency, nocturia, numbness, tingling, seizures,  Focal weakness, Loss of consciousness,  Tremor, insomnia, depression, anxiety, and suicidal ideation.      Objective:  BP 138/80 (BP Location: Left Arm, Patient Position: Sitting, Cuff Size: Normal)   Pulse (!) 104   Temp 98.3 F (36.8 C) (Oral)   Resp 15   Ht 5\' 7"  (1.702 m)   Wt 178 lb (80.7 kg)   SpO2 99%   BMI 27.88 kg/m   BP Readings from Last 3 Encounters:  01/02/17 138/80  06/27/16 116/74  02/03/16 140/82    Wt Readings from Last 3 Encounters:  01/02/17 178 lb (80.7 kg)  06/27/16 178 lb 6.4 oz (80.9 kg)  02/03/16 170 lb (77.1 kg)    General appearance: alert, cooperative and appears stated age Ears: normal TM's and external ear canals both ears Throat: lips, mucosa, and tongue normal; teeth and gums normal Neck: no adenopathy, no carotid bruit, supple, symmetrical, trachea midline and thyroid  not enlarged, symmetric, no tenderness/mass/nodules Back: symmetric, no curvature. ROM normal. No CVA tenderness. Lungs: clear to auscultation bilaterally Heart: regular rate and rhythm, S1, S2 normal, no murmur, click, rub or gallop Abdomen: soft, non-tender; bowel sounds normal; no masses,  no organomegaly Pulses: 2+ and symmetric Skin: Skin color, texture, turgor normal. No rashes or lesions Lymph nodes: Cervical, supraclavicular, and axillary nodes normal.  Lab Results  Component Value Date   HGBA1C 7.9 09/27/2016   HGBA1C 8.5 (H) 11/09/2015    HGBA1C 7.3 03/22/2015    Lab Results  Component Value Date   CREATININE 0.70 01/02/2017   CREATININE 0.7 09/27/2016   CREATININE 0.61 11/18/2015    Lab Results  Component Value Date   WBC 9.7 01/02/2017   HGB 13.9 01/02/2017   HCT 42.8 01/02/2017   PLT 262.0 01/02/2017   GLUCOSE 167 (H) 01/02/2017   CHOL 209 (H) 01/02/2017   TRIG 247.0 (H) 01/02/2017   HDL 57.60 01/02/2017   LDLDIRECT 117.0 01/02/2017   LDLCALC 51 11/25/2014   ALT 20 01/02/2017   AST 18 01/02/2017   NA 136 01/02/2017   K 4.4 01/02/2017   CL 101 01/02/2017   CREATININE 0.70 01/02/2017   BUN 9 01/02/2017   CO2 27 01/02/2017   TSH 3.02 11/25/2014   INR 1.00 11/10/2015   HGBA1C 7.9 09/27/2016   MICROALBUR 30 09/27/2016    Mm Screening Breast Tomo Bilateral  Result Date: 05/11/2016 CLINICAL DATA:  Screening. EXAM: 2D DIGITAL SCREENING BILATERAL MAMMOGRAM WITH CAD AND ADJUNCT TOMO COMPARISON:  Previous exam(s). ACR Breast Density Category c: The breast tissue is heterogeneously dense, which may obscure small masses. FINDINGS: There are no findings suspicious for malignancy. Images were processed with CAD. IMPRESSION: No mammographic evidence of malignancy. A result letter of this screening mammogram will be mailed directly to the patient. RECOMMENDATION: Screening mammogram in one year. (Code:SM-B-01Y) BI-RADS CATEGORY  1: Negative. Electronically Signed   By: Frederico HammanMichelle  Collins M.D.   On: 05/11/2016 10:20    Assessment & Plan:   Problem List Items Addressed This Visit    Diabetes mellitus without complication (HCC)    Improving control with management by endocrine.foot exam and urinary protein assessed by her as well        Dietary iron deficiency without anemia - Primary    Repeat iron stores are lower despite once daily OTC iron supplements.  She developed severe symptomatic IDA last year which required transfusion . Workup was negative for GI bleed (EGD and colonoscopy done).  Will  Have patient take  iron  supplement every other day with 4 ounces of orange juice in light of recent studies suggesting greater absorption with every other day dosing      Elevated blood pressure reading without diagnosis of hypertension    She has no history of hypertension but has had several elevated readings.  She has been asked to check her pressures at home and submit readings for evaluation. Renal function will be checked today      GERD (gastroesophageal reflux disease)    Managed with omeprazole,  Noted on 2017 EGD      Hyperlipidemia    LDL has been <  70 on diet historically; however repeat lipids noted direct LDL of 117 and her ten year risl  of CAD is 11% using FRC.  she has deferred statin therapy at this time   Lab Results  Component Value Date   CHOL 209 (H) 01/02/2017   HDL 57.60 01/02/2017  LDLCALC 51 11/25/2014   LDLDIRECT 117.0 01/02/2017   TRIG 247.0 (H) 01/02/2017   CHOLHDL 4 01/02/2017         Relevant Orders   Lipid panel (Completed)   Comprehensive metabolic panel (Completed)   Sinusitis, acute    Symptoms are mild and appear viral at this point.  Supportive care outlined.  augmentin rx'd for prn use if symptoms worsen.       Relevant Medications   amoxicillin-clavulanate (AUGMENTIN) 875-125 MG tablet   predniSONE (DELTASONE) 10 MG tablet      I am having Melissa Werner start on amoxicillin-clavulanate and predniSONE. I am also having her maintain her NUVARING, glucose blood, omeprazole, ONE TOUCH ULTRA TEST, Insulin Glargine, glipiZIDE, ferrous sulfate, metformin, and liraglutide.  Meds ordered this encounter  Medications  . amoxicillin-clavulanate (AUGMENTIN) 875-125 MG tablet    Sig: Take 1 tablet 2 (two) times daily by mouth.    Dispense:  14 tablet    Refill:  0  . predniSONE (DELTASONE) 10 MG tablet    Sig: 6 tablets on Day 1 , then reduce by 1 tablet daily until gone    Dispense:  21 tablet    Refill:  0    There are no discontinued  medications.  Follow-up: No Follow-up on file.   Sherlene Shams, MD

## 2017-01-02 NOTE — Patient Instructions (Signed)
Let me know if your BP readings are consistently > 120/70   Save the augmentin  and prednisone  For the following symptoms  Facial pain  Ear pain   fever of100.4 or high r blood streaked nasal dischrge  PRODUCT cough with  GREEN OR Brown sputum    Taking an antibiotic can create an imbalance in the normal population of bacteria that live in the small intestine.  This imbalance can persist for 3 months.   Taking a probiotic ( Align, Floraque or Culturelle), the generic version of one of these over the counter medications, or an alternative form (kombucha,  Yogurt, or another dietary source) for a minimum of 3 weeks may help prevent a serious antibiotic associated diarrhea  Called clostridium dificile colitis that occurs when the bacteria population is altered .  Taking a probiotic may also prevent vaginitis due to yeast infections and can be continued indefinitely if you feel that it improves your digestion or your elimination (bowels).

## 2017-01-03 LAB — COMPREHENSIVE METABOLIC PANEL
ALBUMIN: 4.1 g/dL (ref 3.5–5.2)
ALT: 20 U/L (ref 0–35)
AST: 18 U/L (ref 0–37)
Alkaline Phosphatase: 66 U/L (ref 39–117)
BILIRUBIN TOTAL: 0.2 mg/dL (ref 0.2–1.2)
BUN: 9 mg/dL (ref 6–23)
CALCIUM: 9.8 mg/dL (ref 8.4–10.5)
CO2: 27 meq/L (ref 19–32)
CREATININE: 0.7 mg/dL (ref 0.40–1.20)
Chloride: 101 mEq/L (ref 96–112)
GFR: 93.83 mL/min (ref >60.00)
Glucose, Bld: 167 mg/dL — ABNORMAL HIGH (ref 70–99)
Potassium: 4.4 mEq/L (ref 3.5–5.1)
Sodium: 136 mEq/L (ref 135–145)
Total Protein: 7.3 g/dL (ref 6.0–8.3)

## 2017-01-03 LAB — CBC WITH DIFFERENTIAL/PLATELET
BASOS PCT: 0.6 % (ref 0.0–3.0)
Basophils Absolute: 0.1 10*3/uL (ref 0.0–0.1)
EOS PCT: 0.8 % (ref 0.0–5.0)
Eosinophils Absolute: 0.1 10*3/uL (ref 0.0–0.7)
HCT: 42.8 % (ref 36.0–46.0)
Hemoglobin: 13.9 g/dL (ref 12.0–15.0)
LYMPHS ABS: 2.4 10*3/uL (ref 0.7–4.0)
Lymphocytes Relative: 24.9 % (ref 12.0–46.0)
MCHC: 32.4 g/dL (ref 30.0–36.0)
MCV: 82.5 fl (ref 78.0–100.0)
MONO ABS: 1.2 10*3/uL — AB (ref 0.1–1.0)
Monocytes Relative: 12 % (ref 3.0–12.0)
NEUTROS ABS: 6 10*3/uL (ref 1.4–7.7)
NEUTROS PCT: 61.7 % (ref 43.0–77.0)
Platelets: 262 10*3/uL (ref 150.0–400.0)
RBC: 5.19 Mil/uL — ABNORMAL HIGH (ref 3.87–5.11)
RDW: 14.9 % (ref 11.5–15.5)
WBC: 9.7 10*3/uL (ref 4.0–10.5)

## 2017-01-03 LAB — LIPID PANEL
Cholesterol: 209 mg/dL — ABNORMAL HIGH (ref 0–200)
HDL: 57.6 mg/dL
NonHDL: 151.8
Total CHOL/HDL Ratio: 4
Triglycerides: 247 mg/dL — ABNORMAL HIGH (ref 0.0–149.0)
VLDL: 49.4 mg/dL — ABNORMAL HIGH (ref 0.0–40.0)

## 2017-01-03 LAB — IRON,TIBC AND FERRITIN PANEL
%SAT: 4 % — ABNORMAL LOW (ref 11–50)
Ferritin: 35 ng/mL (ref 10–232)
Iron: 16 ug/dL — ABNORMAL LOW (ref 45–160)
TIBC: 414 ug/dL (ref 250–450)

## 2017-01-03 LAB — LDL CHOLESTEROL, DIRECT: Direct LDL: 117 mg/dL

## 2017-01-05 DIAGNOSIS — R030 Elevated blood-pressure reading, without diagnosis of hypertension: Secondary | ICD-10-CM | POA: Insufficient documentation

## 2017-01-05 DIAGNOSIS — J019 Acute sinusitis, unspecified: Secondary | ICD-10-CM | POA: Insufficient documentation

## 2017-01-05 NOTE — Assessment & Plan Note (Addendum)
LDL has been <  70 on diet historically; however repeat lipids noted direct LDL of 117 and her ten year risl  of CAD is 11% using FRC.  she has deferred statin therapy at this time   Lab Results  Component Value Date   CHOL 209 (H) 01/02/2017   HDL 57.60 01/02/2017   LDLCALC 51 11/25/2014   LDLDIRECT 117.0 01/02/2017   TRIG 247.0 (H) 01/02/2017   CHOLHDL 4 01/02/2017

## 2017-01-05 NOTE — Assessment & Plan Note (Signed)
Symptoms are mild and appear viral at this point.  Supportive care outlined.  augmentin rx'd for prn use if symptoms worsen.

## 2017-01-05 NOTE — Assessment & Plan Note (Signed)
Improving control with management by endocrine.foot exam and urinary protein assessed by her as well

## 2017-01-05 NOTE — Assessment & Plan Note (Signed)
Managed with omeprazole,  Noted on 2017 EGD

## 2017-01-05 NOTE — Assessment & Plan Note (Addendum)
Repeat iron stores are lower despite once daily OTC iron supplements.  She developed severe symptomatic IDA last year which required transfusion . Workup was negative for GI bleed (EGD and colonoscopy done).  Will  Have patient take iron  supplement every other day with 4 ounces of orange juice in light of recent studies suggesting greater absorption with every other day dosing

## 2017-01-05 NOTE — Assessment & Plan Note (Signed)
She has no history of hypertension but has had several elevated readings.  She has been asked to check her pressures at home and submit readings for evaluation. Renal function will be checked today 

## 2017-01-06 ENCOUNTER — Encounter: Payer: Self-pay | Admitting: Internal Medicine

## 2017-01-09 DIAGNOSIS — Z794 Long term (current) use of insulin: Secondary | ICD-10-CM | POA: Diagnosis not present

## 2017-01-09 DIAGNOSIS — E1129 Type 2 diabetes mellitus with other diabetic kidney complication: Secondary | ICD-10-CM | POA: Diagnosis not present

## 2017-01-09 DIAGNOSIS — R809 Proteinuria, unspecified: Secondary | ICD-10-CM | POA: Diagnosis not present

## 2017-01-30 DIAGNOSIS — M7661 Achilles tendinitis, right leg: Secondary | ICD-10-CM | POA: Diagnosis not present

## 2017-01-30 DIAGNOSIS — M67871 Other specified disorders of synovium, right ankle and foot: Secondary | ICD-10-CM | POA: Diagnosis not present

## 2017-03-05 ENCOUNTER — Telehealth: Payer: Self-pay

## 2017-03-05 NOTE — Telephone Encounter (Signed)
Copied from CRM [phone]#[id number]. Topic: Inquiry >> Mar 05, 2017 12:52 PM Windy KalataMichael, Taylor L, NT wrote: Reason for CRM: patient is calling stating she needs to set up a lab appt to get her iron checked. I did not see an order. She would like it late February in the afternoon. Please assist further and contact patient with appt. Thank you

## 2017-03-07 ENCOUNTER — Encounter: Payer: Self-pay | Admitting: Internal Medicine

## 2017-03-07 DIAGNOSIS — E611 Iron deficiency: Secondary | ICD-10-CM

## 2017-03-11 NOTE — Telephone Encounter (Signed)
LMTCB. Need to schedule pt for a non fasting lab appt. Labs have been ordered. PEC may speak with pt.

## 2017-03-27 DIAGNOSIS — E1129 Type 2 diabetes mellitus with other diabetic kidney complication: Secondary | ICD-10-CM | POA: Diagnosis not present

## 2017-03-27 DIAGNOSIS — Z794 Long term (current) use of insulin: Secondary | ICD-10-CM | POA: Diagnosis not present

## 2017-03-27 DIAGNOSIS — R809 Proteinuria, unspecified: Secondary | ICD-10-CM | POA: Diagnosis not present

## 2017-03-28 NOTE — Telephone Encounter (Signed)
Patient has lab app

## 2017-04-18 ENCOUNTER — Other Ambulatory Visit (INDEPENDENT_AMBULATORY_CARE_PROVIDER_SITE_OTHER): Payer: 59

## 2017-04-18 DIAGNOSIS — E611 Iron deficiency: Secondary | ICD-10-CM

## 2017-04-19 LAB — CBC WITH DIFFERENTIAL/PLATELET
BASOS ABS: 113 {cells}/uL (ref 0–200)
Basophils Relative: 1.3 %
Eosinophils Absolute: 200 cells/uL (ref 15–500)
Eosinophils Relative: 2.3 %
HEMATOCRIT: 41.2 % (ref 35.0–45.0)
HEMOGLOBIN: 13.6 g/dL (ref 11.7–15.5)
LYMPHS ABS: 3750 {cells}/uL (ref 850–3900)
MCH: 26.7 pg — AB (ref 27.0–33.0)
MCHC: 33 g/dL (ref 32.0–36.0)
MCV: 80.8 fL (ref 80.0–100.0)
MPV: 11.6 fL (ref 7.5–12.5)
Monocytes Relative: 8.4 %
NEUTROS ABS: 3906 {cells}/uL (ref 1500–7800)
Neutrophils Relative %: 44.9 %
Platelets: 336 10*3/uL (ref 140–400)
RBC: 5.1 10*6/uL (ref 3.80–5.10)
RDW: 13.9 % (ref 11.0–15.0)
Total Lymphocyte: 43.1 %
WBC: 8.7 10*3/uL (ref 3.8–10.8)
WBCMIX: 731 {cells}/uL (ref 200–950)

## 2017-04-19 LAB — IRON,TIBC AND FERRITIN PANEL
%SAT: 15 % (calc) (ref 11–50)
Ferritin: 26 ng/mL (ref 10–232)
Iron: 58 ug/dL (ref 45–160)
TIBC: 375 mcg/dL (calc) (ref 250–450)

## 2017-05-09 ENCOUNTER — Ambulatory Visit (INDEPENDENT_AMBULATORY_CARE_PROVIDER_SITE_OTHER): Payer: 59 | Admitting: Obstetrics and Gynecology

## 2017-05-09 ENCOUNTER — Encounter: Payer: Self-pay | Admitting: Obstetrics and Gynecology

## 2017-05-09 ENCOUNTER — Other Ambulatory Visit: Payer: Self-pay

## 2017-05-09 VITALS — BP 140/80 | HR 80 | Ht 67.0 in | Wt 188.0 lb

## 2017-05-09 DIAGNOSIS — Z01419 Encounter for gynecological examination (general) (routine) without abnormal findings: Secondary | ICD-10-CM

## 2017-05-09 DIAGNOSIS — Z1231 Encounter for screening mammogram for malignant neoplasm of breast: Secondary | ICD-10-CM

## 2017-05-09 DIAGNOSIS — N951 Menopausal and female climacteric states: Secondary | ICD-10-CM

## 2017-05-09 DIAGNOSIS — Z1239 Encounter for other screening for malignant neoplasm of breast: Secondary | ICD-10-CM

## 2017-05-09 NOTE — Patient Instructions (Addendum)
I value your feedback and entrusting us with your care. If you get a  patient survey, I would appreciate you taking the time to let us know about your experience today. Thank you!  [facility]: [phone]    

## 2017-05-09 NOTE — Progress Notes (Signed)
PCP: Sherlene Shams, MD   Chief Complaint  Patient presents with  . Gynecologic Exam    No Complaints    HPI:      [name] is a 52 y.o. G1P1001 who LMP was Patient's last menstrual period was 12/18/2016., presents today for her annual examination.  Her menses are infrequent since stopping nuvaring 10/18 due to irreg bleeding. Pt had 1 wk of light bleeding 1/19 and nothing since. Occas vasomtor sx.   Sex activity: single partner, contraception - none. She does not have vaginal dryness.  Last Pap: March 21, 2015  Results were: no abnormalities /neg HPV DNA.  Hx of STDs: none  Last mammogram: May 10, 2016  Results were: normal--routine follow-up in 12 months There is a FH of breast cancer in her mat aunt, genetic testing not indicated. There is no FH of ovarian cancer. The patient does not do self-breast exams.  Colonoscopy: colonoscopy 2 years ago without abnormalities.  Repeat due after 10 years.   Tobacco use: The patient denies current or previous tobacco use. Alcohol use: none Exercise: moderately active  She does not get adequate calcium and Vitamin D in her diet.  Labs with PCP.   Past Medical History:  Diagnosis Date  . Anemia    iron deficiency  . Diabetes mellitus   . GERD (gastroesophageal reflux disease)   . History of abnormal mammogram 2008   negative 2016  . Hyperlipidemia     Past Surgical History:  Procedure Laterality Date  . COLONOSCOPY WITH PROPOFOL N/A 02/03/2016   Procedure: COLONOSCOPY WITH PROPOFOL;  Surgeon: Scot Jun, MD;  Location: [facility] ENDOSCOPY;  Service: Endoscopy;  Laterality: N/A;  . ESOPHAGOGASTRODUODENOSCOPY (EGD) WITH PROPOFOL N/A 02/03/2016   Procedure: ESOPHAGOGASTRODUODENOSCOPY (EGD) WITH PROPOFOL;  Surgeon: Scot Jun, MD;  Location: [facility] ENDOSCOPY;  Service: Endoscopy;  Laterality: N/A;  . TONSILLECTOMY    . WISDOM TOOTH EXTRACTION      Family History  Problem Relation Age of Onset  .  Hyperlipidemia Father   . Heart disease Father   . Breast cancer Maternal Aunt 60    Social History   Socioeconomic History  . Marital status: Married    Spouse name: Not on file  . Number of children: Not on file  . Years of education: Not on file  . Highest education level: Not on file  Social Needs  . Financial resource strain: Not on file  . Food insecurity - worry: Not on file  . Food insecurity - inability: Not on file  . Transportation needs - medical: Not on file  . Transportation needs - non-medical: Not on file  Occupational History  . Not on file  Tobacco Use  . Smoking status: Never Smoker  . Smokeless tobacco: Never Used  Substance and Sexual Activity  . Alcohol use: No  . Drug use: No  . Sexual activity: Yes    Birth control/protection: None  Other Topics Concern  . Not on file  Social History Narrative  . Not on file    Current Outpatient Medications on File Prior to Visit  Medication Sig Dispense Refill  . ferrous sulfate 325 (65 FE) MG tablet Take 1 tablet (325 mg total) by mouth 2 (two) times daily with a meal. 60 tablet 0  . glipiZIDE (GLUCOTROL) 10 MG tablet Take 10 mg by mouth daily before breakfast.     . glucose blood test strip Use once daily to check cbg   DM  type 2 100 each 1  . Insulin Glargine (LANTUS SOLOSTAR) 100 UNIT/ML Solostar Pen Take 20 units subcutaneously each night    . liraglutide (VICTOZA) 18 MG/3ML SOPN Inject 1.8 mg into the skin daily.    . metformin (FORTAMET) 1000 MG (OSM) 24 hr tablet Take 2,000 mg by mouth daily with breakfast.    . omeprazole (PRILOSEC) 20 MG capsule Take 20 mg by mouth daily.    . ONE TOUCH ULTRA TEST test strip USE 3 TIMES DAILY TO CHECK BLOOD SUGAR. 100 each 2  . amoxicillin-clavulanate (AUGMENTIN) 875-125 MG tablet Take 1 tablet 2 (two) times daily by mouth. (Patient not taking: Reported on 05/09/2017) 14 tablet 0  . predniSONE (DELTASONE) 10 MG tablet 6 tablets on Day 1 , then reduce by 1 tablet daily  until gone (Patient not taking: Reported on 05/09/2017) 21 tablet 0   No current facility-administered medications on file prior to visit.        ROS:  Review of Systems  Constitutional: Negative for fatigue, fever and unexpected weight change.  Respiratory: Negative for cough, shortness of breath and wheezing.   Cardiovascular: Negative for chest pain, palpitations and leg swelling.  Gastrointestinal: Negative for blood in stool, constipation, diarrhea, nausea and vomiting.  Endocrine: Negative for cold intolerance, heat intolerance and polyuria.  Genitourinary: Negative for dyspareunia, dysuria, flank pain, frequency, genital sores, hematuria, menstrual problem, pelvic pain, urgency, vaginal bleeding, vaginal discharge and vaginal pain.  Musculoskeletal: Negative for back pain, joint swelling and myalgias.  Skin: Negative for rash.  Neurological: Negative for dizziness, syncope, light-headedness, numbness and headaches.  Hematological: Negative for adenopathy.  Psychiatric/Behavioral: Negative for agitation, confusion, sleep disturbance and suicidal ideas. The patient is not nervous/anxious.    BREAST: No symptoms    Objective: BP 140/80 (BP Location: Left Arm, Patient Position: Sitting, Cuff Size: Normal)   Pulse 80   Ht 5\' 7"  (1.702 m)   Wt 188 lb (85.3 kg)   LMP 12/18/2016   BMI 29.44 kg/m    Physical Exam  Constitutional: She is oriented to person, place, and time. She appears well-developed and well-nourished.  Genitourinary: Vagina normal and uterus normal. There is no rash or tenderness on the right labia. There is no rash or tenderness on the left labia. No erythema or tenderness in the vagina. No vaginal discharge found. Right adnexum does not display mass and does not display tenderness. Left adnexum does not display mass and does not display tenderness. Cervix does not exhibit motion tenderness or polyp. Uterus is not enlarged or tender.  Neck: Normal range of  motion. No thyromegaly present.  Cardiovascular: Normal rate, regular rhythm and normal heart sounds.  No murmur heard. Pulmonary/Chest: Effort normal and breath sounds normal. Right breast exhibits no mass, no nipple discharge, no skin change and no tenderness. Left breast exhibits no mass, no nipple discharge, no skin change and no tenderness.  Abdominal: Soft. There is no tenderness. There is no guarding.  Musculoskeletal: Normal range of motion.  Neurological: She is alert and oriented to person, place, and time. No cranial nerve deficit.  Psychiatric: She has a normal mood and affect. Her behavior is normal.  Vitals reviewed.   Assessment/Plan:  Encounter for annual routine gynecological examination  Screening for breast cancer - Pt to sched mammo. - Plan: MM DIGITAL SCREENING BILATERAL  Perimenopause - F/u prn DUB.         GYN counsel breast self exam, mammography screening, menopause, adequate intake of calcium and vitamin  D, diet and exercise    F/U  Return in about 1 year (around 05/10/2018).  Blen Ransome B. Aureliano Oshields, PA-C 05/09/2017 3:26 PM

## 2017-06-04 ENCOUNTER — Ambulatory Visit
Admission: RE | Admit: 2017-06-04 | Discharge: 2017-06-04 | Disposition: A | Discharge status: Home / Self Care | Payer: 59 | Source: Ambulatory Visit | Attending: Obstetrics and Gynecology | Admitting: Obstetrics and Gynecology

## 2017-06-04 DIAGNOSIS — Z1231 Encounter for screening mammogram for malignant neoplasm of breast: Secondary | ICD-10-CM | POA: Diagnosis not present

## 2017-06-04 DIAGNOSIS — Z1239 Encounter for other screening for malignant neoplasm of breast: Secondary | ICD-10-CM

## 2017-06-05 ENCOUNTER — Encounter: Payer: Self-pay | Admitting: Obstetrics and Gynecology

## 2017-07-02 DIAGNOSIS — R809 Proteinuria, unspecified: Secondary | ICD-10-CM | POA: Diagnosis not present

## 2017-07-02 DIAGNOSIS — E1129 Type 2 diabetes mellitus with other diabetic kidney complication: Secondary | ICD-10-CM | POA: Diagnosis not present

## 2017-07-02 DIAGNOSIS — Z794 Long term (current) use of insulin: Secondary | ICD-10-CM | POA: Diagnosis not present

## 2017-07-09 DIAGNOSIS — R809 Proteinuria, unspecified: Secondary | ICD-10-CM | POA: Diagnosis not present

## 2017-07-09 DIAGNOSIS — Z794 Long term (current) use of insulin: Secondary | ICD-10-CM | POA: Diagnosis not present

## 2017-07-09 DIAGNOSIS — E1129 Type 2 diabetes mellitus with other diabetic kidney complication: Secondary | ICD-10-CM | POA: Diagnosis not present

## 2017-11-07 ENCOUNTER — Ambulatory Visit: Payer: 59 | Admitting: Obstetrics and Gynecology

## 2017-11-08 ENCOUNTER — Ambulatory Visit: Payer: 59 | Admitting: Obstetrics and Gynecology

## 2017-12-31 DIAGNOSIS — E119 Type 2 diabetes mellitus without complications: Secondary | ICD-10-CM | POA: Diagnosis not present

## 2017-12-31 DIAGNOSIS — H5203 Hypermetropia, bilateral: Secondary | ICD-10-CM | POA: Diagnosis not present

## 2017-12-31 LAB — HM DIABETES EYE EXAM

## 2018-01-07 DIAGNOSIS — R809 Proteinuria, unspecified: Secondary | ICD-10-CM | POA: Diagnosis not present

## 2018-01-07 DIAGNOSIS — Z794 Long term (current) use of insulin: Secondary | ICD-10-CM | POA: Diagnosis not present

## 2018-01-07 DIAGNOSIS — E1129 Type 2 diabetes mellitus with other diabetic kidney complication: Secondary | ICD-10-CM | POA: Diagnosis not present

## 2018-01-14 DIAGNOSIS — E119 Type 2 diabetes mellitus without complications: Secondary | ICD-10-CM | POA: Diagnosis not present

## 2018-05-21 MED FILL — ACCU-CHEK GUIDE TEST STRIP: 30 days supply | Qty: 100 | Fill #0

## 2018-06-02 MED FILL — VICTOZA 18 MG/3 ML INJECT P: 18 | 30 days supply | Qty: 9 | Fill #0

## 2018-06-16 MED FILL — LANTUS SOLOSTAR 100 UNITS/M: 100 | 46 days supply | Qty: 15 | Fill #0

## 2018-06-17 ENCOUNTER — Encounter: Payer: 59 | Admitting: Internal Medicine

## 2018-06-23 ENCOUNTER — Ambulatory Visit: Payer: 59 | Admitting: Obstetrics and Gynecology

## 2018-06-30 MED FILL — VICTOZA 18 MG/3 ML INJECT P: 18 | 30 days supply | Qty: 9 | Fill #1

## 2018-06-30 MED FILL — UNIFINE PENTIPS 31GX3/16: 31G X 5 MM | 30 days supply | Qty: 100 | Fill #0

## 2018-06-30 MED FILL — UNIFINE PENTIPS 31GX3/16": 31G X 5 MM | 30 days supply | Qty: 100 | Fill #0

## 2018-07-09 DIAGNOSIS — E119 Type 2 diabetes mellitus without complications: Secondary | ICD-10-CM | POA: Diagnosis not present

## 2018-07-10 MED FILL — glipiZIDE ER 10 MG TB24: 10 | 90 days supply | Qty: 90 | Fill #0

## 2018-07-15 DIAGNOSIS — E119 Type 2 diabetes mellitus without complications: Secondary | ICD-10-CM | POA: Diagnosis not present

## 2018-07-29 ENCOUNTER — Other Ambulatory Visit: Payer: Self-pay | Admitting: Internal Medicine

## 2018-07-29 DIAGNOSIS — Z1231 Encounter for screening mammogram for malignant neoplasm of breast: Secondary | ICD-10-CM

## 2018-07-29 NOTE — Progress Notes (Signed)
PCP: Sherlene Shams, MD   Chief Complaint  Patient presents with  . Gynecologic Exam    HPI:      [name] is a 52 y.o. G1P1001 who LMP was Patient's last menstrual period was 07/17/2018 (exact date)., presents today for her annual examination.  Her menses are infrequent due to perimenopause. Has a few days of light bleeding every few months with mild dysmen. No vasomtor sx.   Sex activity: single partner, contraception - none. She does not have vaginal dryness.  Last Pap: March 21, 2015  Results were: no abnormalities /neg HPV DNA.  Hx of STDs: none  Last mammogram: 06/04/17  Results were: normal--routine follow-up in 12 months. Has appt 7/20. There is a FH of breast cancer in her mat aunt, genetic testing not indicated. There is no FH of ovarian cancer. The patient does not do self-breast exams.  Colonoscopy: colonoscopy 3 years ago without abnormalities.  Repeat due after 10 years.   Tobacco use: The patient denies current or previous tobacco use. Alcohol use: none Exercise: moderately active  She does get adequate calcium but not Vitamin D in her diet.  Labs with PCP.   Past Medical History:  Diagnosis Date  . Anemia    iron deficiency  . Diabetes mellitus   . GERD (gastroesophageal reflux disease)   . History of abnormal mammogram 2008   negative 2016  . Hyperlipidemia     Past Surgical History:  Procedure Laterality Date  . COLONOSCOPY WITH PROPOFOL N/A 02/03/2016   Procedure: COLONOSCOPY WITH PROPOFOL;  Surgeon: Scot Jun, MD;  Location: [facility] ENDOSCOPY;  Service: Endoscopy;  Laterality: N/A;  . ESOPHAGOGASTRODUODENOSCOPY (EGD) WITH PROPOFOL N/A 02/03/2016   Procedure: ESOPHAGOGASTRODUODENOSCOPY (EGD) WITH PROPOFOL;  Surgeon: Scot Jun, MD;  Location: [facility] ENDOSCOPY;  Service: Endoscopy;  Laterality: N/A;  . TONSILLECTOMY    . WISDOM TOOTH EXTRACTION      Family History  Problem Relation Age of Onset  . Hyperlipidemia Father   .  Heart disease Father   . Breast cancer Maternal Aunt 49    Social History   Socioeconomic History  . Marital status: Married    Spouse name: Not on file  . Number of children: Not on file  . Years of education: Not on file  . Highest education level: Not on file  Occupational History  . Not on file  Social Needs  . Financial resource strain: Not on file  . Food insecurity:    Worry: Not on file    Inability: Not on file  . Transportation needs:    Medical: Not on file    Non-medical: Not on file  Tobacco Use  . Smoking status: Never Smoker  . Smokeless tobacco: Never Used  Substance and Sexual Activity  . Alcohol use: No  . Drug use: No  . Sexual activity: Yes    Birth control/protection: None  Lifestyle  . Physical activity:    Days per week: 3 days    Minutes per session: 60 min  . Stress: Not at all  Relationships  . Social connections:    Talks on phone: Once a week    Gets together: Once a week    Attends religious service: More than 4 times per year    Active member of club or organization: Yes    Attends meetings of clubs or organizations: More than 4 times per year    Relationship status: Married  . Intimate partner violence:  Fear of current or ex partner: No    Emotionally abused: No    Physically abused: No    Forced sexual activity: No  Other Topics Concern  . Not on file  Social History Narrative  . Not on file    Current Outpatient Medications on File Prior to Visit  Medication Sig Dispense Refill  . ferrous sulfate 325 (65 FE) MG tablet Take 1 tablet (325 mg total) by mouth 2 (two) times daily with a meal. 60 tablet 0  . glipiZIDE (GLUCOTROL) 10 MG tablet Take 10 mg by mouth daily before breakfast.     . glucose blood test strip Use once daily to check cbg   DM type 2 100 each 1  . Insulin Glargine (LANTUS SOLOSTAR) 100 UNIT/ML Solostar Pen Take 20 units subcutaneously each night    . metformin (FORTAMET) 1000 MG (OSM) 24 hr tablet  metformin ER 1,000 mg tablet,extended release 24hr    . omeprazole (PRILOSEC) 20 MG capsule Take 20 mg by mouth daily.    . ONE TOUCH ULTRA TEST test strip USE 3 TIMES DAILY TO CHECK BLOOD SUGAR. 100 each 2  . Semaglutide, 1 MG/DOSE, 2 MG/1.5ML SOPN Inject into the skin.    Marland Kitchen UNIFINE PENTIPS 31G X 5 MM MISC      No current facility-administered medications on file prior to visit.        ROS:  Review of Systems  Constitutional: Negative for fatigue, fever and unexpected weight change.  Respiratory: Negative for cough, shortness of breath and wheezing.   Cardiovascular: Negative for chest pain, palpitations and leg swelling.  Gastrointestinal: Negative for blood in stool, constipation, diarrhea, nausea and vomiting.  Endocrine: Negative for cold intolerance, heat intolerance and polyuria.  Genitourinary: Negative for dyspareunia, dysuria, flank pain, frequency, genital sores, hematuria, menstrual problem, pelvic pain, urgency, vaginal bleeding, vaginal discharge and vaginal pain.  Musculoskeletal: Negative for back pain, joint swelling and myalgias.  Skin: Negative for rash.  Neurological: Negative for dizziness, syncope, light-headedness, numbness and headaches.  Hematological: Negative for adenopathy.  Psychiatric/Behavioral: Negative for agitation, confusion, sleep disturbance and suicidal ideas. The patient is not nervous/anxious.    BREAST: No symptoms    Objective: BP (!) 150/90   Ht 5\' 7"  (1.702 m)   Wt 190 lb 9.6 oz (86.5 kg)   LMP 07/17/2018 (Exact Date)   BMI 29.85 kg/m    Physical Exam Constitutional:      Appearance: She is well-developed.  Genitourinary:     Vulva, vagina, uterus, right adnexa and left adnexa normal.     No vulval lesion or tenderness noted.     No vaginal discharge, erythema or tenderness.     No cervical motion tenderness or polyp.     Uterus is not enlarged or tender.     No right or left adnexal mass present.     Right adnexa not  tender.     Left adnexa not tender.  Neck:     Musculoskeletal: Normal range of motion.     Thyroid: No thyromegaly.  Cardiovascular:     Rate and Rhythm: Normal rate and regular rhythm.     Heart sounds: Normal heart sounds. No murmur.  Pulmonary:     Effort: Pulmonary effort is normal.     Breath sounds: Normal breath sounds.  Chest:     Breasts:        Right: No mass, nipple discharge, skin change or tenderness.  Left: No mass, nipple discharge, skin change or tenderness.  Abdominal:     Palpations: Abdomen is soft.     Tenderness: There is no abdominal tenderness. There is no guarding.  Musculoskeletal: Normal range of motion.  Neurological:     General: No focal deficit present.     Mental Status: She is alert and oriented to person, place, and time.     Cranial Nerves: No cranial nerve deficit.  Skin:    General: Skin is warm and dry.  Psychiatric:        Mood and Affect: Mood normal.        Behavior: Behavior normal.        Thought Content: Thought content normal.        Judgment: Judgment normal.  Vitals signs reviewed.     Assessment/Plan:  Encounter for annual routine gynecological examination  Cervical cancer screening - Plan: Cytology - PAP  Screening for HPV (human papillomavirus) - Plan: Cytology - PAP  Screening for breast cancer - Pt has mammo sched 7/20.  Perimenopause - F/u prn DUB        GYN counsel breast self exam, mammography screening, menopause, adequate intake of calcium and vitamin D, diet and exercise    F/U  Return in about 1 year (around 07/30/2019).   B. , PA-C 07/30/2018 4:33 PM

## 2018-07-30 ENCOUNTER — Other Ambulatory Visit (HOSPITAL_COMMUNITY)
Admission: RE | Admit: 2018-07-30 | Discharge: 2018-07-30 | Disposition: A | Discharge status: Home / Self Care | Payer: 59 | Source: Ambulatory Visit | Attending: Obstetrics and Gynecology | Admitting: Obstetrics and Gynecology

## 2018-07-30 ENCOUNTER — Encounter: Payer: Self-pay | Admitting: Obstetrics and Gynecology

## 2018-07-30 ENCOUNTER — Other Ambulatory Visit: Payer: Self-pay

## 2018-07-30 ENCOUNTER — Ambulatory Visit (INDEPENDENT_AMBULATORY_CARE_PROVIDER_SITE_OTHER): Payer: 59 | Admitting: Obstetrics and Gynecology

## 2018-07-30 VITALS — BP 150/90 | Ht 67.0 in | Wt 190.6 lb

## 2018-07-30 DIAGNOSIS — Z1151 Encounter for screening for human papillomavirus (HPV): Secondary | ICD-10-CM | POA: Diagnosis not present

## 2018-07-30 DIAGNOSIS — N951 Menopausal and female climacteric states: Secondary | ICD-10-CM | POA: Insufficient documentation

## 2018-07-30 DIAGNOSIS — Z124 Encounter for screening for malignant neoplasm of cervix: Secondary | ICD-10-CM

## 2018-07-30 DIAGNOSIS — Z01419 Encounter for gynecological examination (general) (routine) without abnormal findings: Secondary | ICD-10-CM | POA: Diagnosis not present

## 2018-07-30 DIAGNOSIS — Z1239 Encounter for other screening for malignant neoplasm of breast: Secondary | ICD-10-CM

## 2018-07-30 NOTE — Patient Instructions (Signed)
I value your feedback and entrusting us with your care. If you get a [place] patient survey, I would appreciate you taking the time to let us know about your experience today. Thank you! 

## 2018-08-04 LAB — CYTOLOGY - PAP
Diagnosis: NEGATIVE
HPV: NOT DETECTED

## 2018-08-05 ENCOUNTER — Encounter: Payer: 59 | Admitting: Internal Medicine

## 2018-08-08 ENCOUNTER — Other Ambulatory Visit: Payer: Self-pay

## 2018-08-11 ENCOUNTER — Ambulatory Visit (INDEPENDENT_AMBULATORY_CARE_PROVIDER_SITE_OTHER): Payer: 59 | Admitting: Internal Medicine

## 2018-08-11 ENCOUNTER — Other Ambulatory Visit: Payer: Self-pay

## 2018-08-11 ENCOUNTER — Encounter: Payer: Self-pay | Admitting: Internal Medicine

## 2018-08-11 VITALS — BP 108/70 | HR 90 | Temp 98.1°F | Resp 14 | Ht 67.0 in | Wt 198.2 lb

## 2018-08-11 DIAGNOSIS — E663 Overweight: Secondary | ICD-10-CM | POA: Diagnosis not present

## 2018-08-11 DIAGNOSIS — E782 Mixed hyperlipidemia: Secondary | ICD-10-CM

## 2018-08-11 DIAGNOSIS — E611 Iron deficiency: Secondary | ICD-10-CM | POA: Diagnosis not present

## 2018-08-11 DIAGNOSIS — E119 Type 2 diabetes mellitus without complications: Secondary | ICD-10-CM

## 2018-08-11 NOTE — Progress Notes (Signed)
Patient ID: Melissa Werner, female    DOB: 01-11-1966  Age: 53 y.o. MRN: [id number]  The patient is here for annual preventive  examination and management of other chronic and acute problems.    Normal PAP Jun 2020,  [name]  Colon 2017 Mammogram  Scheduled    The risk factors are reflected in the social history.  The roster of all physicians providing medical care to patient - is listed in the Snapshot section of the chart.  Activities of daily living:  The patient is 100% independent in all ADLs: dressing, toileting, feeding as well as independent mobility  Home safety : The patient has smoke detectors in the home. They wear seatbelts.  There are no firearms at home. There is no violence in the home.   There is no risks for hepatitis, STDs or HIV. There is no   history of blood transfusion. They have no travel history to infectious disease endemic areas of the world.  The patient has seen their dentist in the last six month. They have seen their eye doctor in the last year. They admit to slight hearing difficulty with regard to whispered voices and some television programs.  They have deferred audiologic testing in the last year.  They do not  have excessive sun exposure. Discussed the need for sun protection: hats, long sleeves and use of sunscreen if there is significant sun exposure.   Diet: the importance of a healthy diet is discussed. They do have a healthy diet.  The benefits of regular aerobic exercise were discussed. She walks 4 times per week ,  20 minutes.   Depression screen: there are no signs or vegative symptoms of depression- irritability, change in appetite, anhedonia, sadness/tearfullness.  Cognitive assessment: the patient manages all their financial and personal affairs and is actively engaged. They could relate day,date,year and events; recalled 2/3 objects at 3 minutes; performed clock-face test normally.  The following portions of the patient's history were  reviewed and updated as appropriate: allergies, current medications, past family history, past medical history,  past surgical history, past social history  and problem list.  Visual acuity was not assessed per patient preference since she has regular follow up with her ophthalmologist. Hearing and body mass index were assessed and reviewed.   During the course of the visit the patient was educated and counseled about appropriate screening and preventive services including : fall prevention , diabetes screening, nutrition counseling, colorectal cancer screening, and recommended immunizations.    CC: The primary encounter diagnosis was Dietary iron deficiency without anemia. Diagnoses of Mixed hyperlipidemia, Diabetes mellitus without complication (HCC), Hypocalcemia, and Overweight (BMI 25.0-29.9) were also pertinent to this visit.  Se feels generally well, is walking  several times per week and checking blood sugars once daily at variable times.  BS have been under 150 fasting and < 180 post prandially.  Denies any recent hypoglyemic events.  Taking his medications but has noticed that the 1 mg  dose of ozempic causing nausea and bad diarrhea   Lower  Dose helping.  Still taking metformin XR . FDA recall discussed.  She is following a carbohydrate modified diet 6 days per week. Denies numbness, burning and tingling of extremities. Appetite is good.   The patient has no signs or symptoms of COVID 19 infection (fever, cough, sore throat  or shortness of breath beyond what is typical for patient).  Patient denies contact with other persons with the above mentioned symptoms or with anyone confirmed  to have COVID 19    Type 2 DM:   History Melissa Werner has a past medical history of Anemia, Diabetes mellitus, GERD (gastroesophageal reflux disease), History of abnormal mammogram (2008), and Hyperlipidemia.   She has a past surgical history that includes Tonsillectomy; Wisdom tooth extraction; Colonoscopy with  propofol (N/A, 02/03/2016); and Esophagogastroduodenoscopy (egd) with propofol (N/A, 02/03/2016).   Her family history includes Breast cancer (age of onset: 76) in her maternal aunt; Heart disease in her father; Hyperlipidemia in her father.She reports that she has never smoked. She has never used smokeless tobacco. She reports that she does not drink alcohol or use drugs.  Outpatient Medications Prior to Visit  Medication Sig Dispense Refill  . ferrous sulfate 325 (65 FE) MG tablet Take 1 tablet (325 mg total) by mouth 2 (two) times daily with a meal. 60 tablet 0  . glipiZIDE (GLUCOTROL) 10 MG tablet Take 10 mg by mouth daily before breakfast.     . glucose blood test strip Use once daily to check cbg   DM type 2 100 each 1  . Insulin Glargine (LANTUS SOLOSTAR) 100 UNIT/ML Solostar Pen Take 20 units subcutaneously each night    . metformin (FORTAMET) 1000 MG (OSM) 24 hr tablet metformin ER 1,000 mg tablet,extended release 24hr    . omeprazole (PRILOSEC) 20 MG capsule Take 20 mg by mouth daily.    . ONE TOUCH ULTRA TEST test strip USE 3 TIMES DAILY TO CHECK BLOOD SUGAR. 100 each 2  . Semaglutide, 1 MG/DOSE, 2 MG/1.5ML SOPN Inject into the skin.    Marland Kitchen UNIFINE PENTIPS 31G X 5 MM MISC      No facility-administered medications prior to visit.     Review of Systems   Patient denies headache, fevers, malaise, unintentional weight loss, skin rash, eye pain, sinus congestion and sinus pain, sore throat, dysphagia,  hemoptysis , cough, dyspnea, wheezing, chest pain, palpitations, orthopnea, edema, abdominal pain, nausea, melena, diarrhea, constipation, flank pain, dysuria, hematuria, urinary  Frequency, nocturia, numbness, tingling, seizures,  Focal weakness, Loss of consciousness,  Tremor, insomnia, depression, anxiety, and suicidal ideation.      Objective:  BP 108/70 (BP Location: Left Arm, Patient Position: Sitting, Cuff Size: Normal)   Pulse 90   Temp 98.1 F (36.7 C) (Oral)   Resp 14   Ht  5\' 7"  (1.702 m)   Wt 198 lb 3.2 oz (89.9 kg)   LMP 07/17/2018 (Exact Date)   SpO2 98%   BMI 31.04 kg/m   Physical Exam   General appearance: alert, cooperative and appears stated age Ears: normal TM's and external ear canals both ears Throat: lips, mucosa, and tongue normal; teeth and gums normal Neck: no adenopathy, no carotid bruit, supple, symmetrical, trachea midline and thyroid not enlarged, symmetric, no tenderness/mass/nodules Back: symmetric, no curvature. ROM normal. No CVA tenderness. Lungs: clear to auscultation bilaterally Heart: regular rate and rhythm, S1, S2 normal, no murmur, click, rub or gallop Abdomen: soft, non-tender; bowel sounds normal; no masses,  no organomegaly Pulses: 2+ and symmetric Skin: Skin color, texture, turgor normal. No rashes or lesions Lymph nodes: Cervical, supraclavicular, and axillary nodes normal.   Assessment & Plan:   Problem List Items Addressed This Visit    Diabetes mellitus without complication ([place])    Will advise her to  stop metformin XR given FDA recall .  Has lowered ozempic dose as well       Relevant Orders   Comprehensive metabolic panel (Completed)   Dietary iron  deficiency without anemia - Primary    Resolved by last check. She has been feeling more fatigued and short of breath. Rechecking now       Relevant Orders   Iron, TIBC and Ferritin Panel (Completed)   CBC with Differential/Platelet (Completed)   Hyperlipidemia    LDL has been <  70 on diet historically; however repeat lipids noted direct LDL of 117 and her ten year risk  of CAD is 11% using FRC.  she has deferred statin therapy at this time   Lab Results  Component Value Date   CHOL 179 08/11/2018   HDL 47.40 08/11/2018   LDLCALC 51 11/25/2014   LDLDIRECT 100.0 08/11/2018   TRIG 296.0 (H) 08/11/2018   CHOLHDL 4 08/11/2018         Relevant Orders   Lipid panel (Completed)   Hypocalcemia    Normal on repeat.   Lab Results  Component Value Date    PTH 12 (L) 11/18/2015   CALCIUM 9.5 08/11/2018         Overweight (BMI 25.0-29.9)    I have addressed  BMI and recommended a low glycemic index diet utilizing smaller more frequent meals to increase metabolism.  I have also recommended that patient start exercising with a goal of 30 minutes of aerobic exercise a minimum of 5 days per week. Screening for lipid disorders, thyroid and diabetes to be done today.           I am having Collyn L. Coopman maintain her glucose blood, omeprazole, ONE TOUCH ULTRA TEST, Insulin Glargine, glipiZIDE, ferrous sulfate, metformin, Semaglutide (1 MG/DOSE), and Unifine Pentips.  No orders of the defined types were placed in this encounter.   There are no discontinued medications.  Follow-up: No follow-ups on file.   Melissa Shamseresa L Khalani Novoa, MD

## 2018-08-11 NOTE — Progress Notes (Signed)
vit

## 2018-08-11 NOTE — Patient Instructions (Signed)

## 2018-08-12 LAB — CBC WITH DIFFERENTIAL/PLATELET
Basophils Absolute: 0.2 10*3/uL — ABNORMAL HIGH (ref 0.0–0.1)
Basophils Relative: 1.9 % (ref 0.0–3.0)
Eosinophils Absolute: 0.3 10*3/uL (ref 0.0–0.7)
Eosinophils Relative: 3.3 % (ref 0.0–5.0)
HCT: 39.7 % (ref 36.0–46.0)
Hemoglobin: 12.8 g/dL (ref 12.0–15.0)
Lymphocytes Relative: 41.5 % (ref 12.0–46.0)
Lymphs Abs: 4.1 10*3/uL — ABNORMAL HIGH (ref 0.7–4.0)
MCHC: 32.2 g/dL (ref 30.0–36.0)
MCV: 83.1 fl (ref 78.0–100.0)
Monocytes Absolute: 0.5 10*3/uL (ref 0.1–1.0)
Monocytes Relative: 5.1 % (ref 3.0–12.0)
Neutro Abs: 4.7 10*3/uL (ref 1.4–7.7)
Neutrophils Relative %: 48.2 % (ref 43.0–77.0)
Platelets: 267 10*3/uL (ref 150.0–400.0)
RBC: 4.78 Mil/uL (ref 3.87–5.11)
RDW: 13.9 % (ref 11.5–15.5)
WBC: 9.9 10*3/uL (ref 4.0–10.5)

## 2018-08-12 LAB — LIPID PANEL
Cholesterol: 179 mg/dL (ref 0–200)
HDL: 47.4 mg/dL (ref >39.00)
NonHDL: 131.59
Total CHOL/HDL Ratio: 4
Triglycerides: 296 mg/dL — ABNORMAL HIGH (ref 0.0–149.0)
VLDL: 59.2 mg/dL — ABNORMAL HIGH (ref 0.0–40.0)

## 2018-08-12 LAB — COMPREHENSIVE METABOLIC PANEL
ALT: 13 U/L (ref 0–35)
AST: 12 U/L (ref 0–37)
Albumin: 4.3 g/dL (ref 3.5–5.2)
Alkaline Phosphatase: 69 U/L (ref 39–117)
BUN: 10 mg/dL (ref 6–23)
CO2: 30 mEq/L (ref 19–32)
Calcium: 9.5 mg/dL (ref 8.4–10.5)
Chloride: 103 mEq/L (ref 96–112)
Creatinine, Ser: 0.63 mg/dL (ref 0.40–1.20)
GFR: 99.07 mL/min (ref >60.00)
Glucose, Bld: 82 mg/dL (ref 70–99)
Potassium: 4.1 mEq/L (ref 3.5–5.1)
Sodium: 141 mEq/L (ref 135–145)
Total Bilirubin: 0.2 mg/dL (ref 0.2–1.2)
Total Protein: 6.4 g/dL (ref 6.0–8.3)

## 2018-08-12 LAB — IRON,TIBC AND FERRITIN PANEL
%SAT: 12 % (calc) — ABNORMAL LOW (ref 16–45)
Ferritin: 28 ng/mL (ref 16–232)
Iron: 39 ug/dL — ABNORMAL LOW (ref 45–160)
TIBC: 313 mcg/dL (calc) (ref 250–450)

## 2018-08-12 LAB — LDL CHOLESTEROL, DIRECT: Direct LDL: 100 mg/dL

## 2018-08-12 NOTE — Assessment & Plan Note (Signed)
Normal on repeat.   Lab Results  Component Value Date   PTH 12 (L) 11/18/2015   CALCIUM 9.5 08/11/2018

## 2018-08-12 NOTE — Assessment & Plan Note (Addendum)
Will advise her to  stop metformin XR given FDA recall .  Has lowered ozempic dose as well

## 2018-08-12 NOTE — Assessment & Plan Note (Signed)
Resolved by last check. She has been feeling more fatigued and short of breath. Rechecking now

## 2018-08-12 NOTE — Assessment & Plan Note (Signed)
LDL has been <  70 on diet historically; however repeat lipids noted direct LDL of 117 and her ten year risk  of CAD is 11% using FRC.  she has deferred statin therapy at this time   Lab Results  Component Value Date   CHOL 179 08/11/2018   HDL 47.40 08/11/2018   LDLCALC 51 11/25/2014   LDLDIRECT 100.0 08/11/2018   TRIG 296.0 (H) 08/11/2018   CHOLHDL 4 08/11/2018

## 2018-08-12 NOTE — Assessment & Plan Note (Signed)
I have addressed  BMI and recommended a low glycemic index diet utilizing smaller more frequent meals to increase metabolism.  I have also recommended that patient start exercising with a goal of 30 minutes of aerobic exercise a minimum of 5 days per week. Screening for lipid disorders, thyroid and diabetes to be done today.   

## 2018-09-10 ENCOUNTER — Other Ambulatory Visit: Payer: Self-pay

## 2018-09-10 ENCOUNTER — Ambulatory Visit
Admission: RE | Admit: 2018-09-10 | Discharge: 2018-09-10 | Disposition: A | Discharge status: Home / Self Care | Payer: 59 | Source: Ambulatory Visit | Attending: Internal Medicine | Admitting: Internal Medicine

## 2018-09-10 DIAGNOSIS — Z1231 Encounter for screening mammogram for malignant neoplasm of breast: Secondary | ICD-10-CM | POA: Insufficient documentation

## 2018-10-14 ENCOUNTER — Telehealth: Payer: 59 | Admitting: Family

## 2018-10-14 DIAGNOSIS — J329 Chronic sinusitis, unspecified: Secondary | ICD-10-CM

## 2018-10-14 DIAGNOSIS — B9789 Other viral agents as the cause of diseases classified elsewhere: Secondary | ICD-10-CM | POA: Diagnosis not present

## 2018-10-14 MED ORDER — FLUTICASONE PROPIONATE 50 MCG/ACT NA SUSP: 2.0000 | Freq: Every day | NASAL | 0 refills | Status: DC

## 2018-10-14 NOTE — Progress Notes (Signed)
We are sorry that you are not feeling well.  Here is how we plan to help!  Based on what you have shared with me it looks like you have sinusitis.  Sinusitis is inflammation and infection in the sinus cavities of the head.  Based on your presentation I believe you most likely have Acute Viral Sinusitis.This is an infection most likely caused by a virus. There is not specific treatment for viral sinusitis other than to help you with the symptoms until the infection runs its course.  You may use an oral decongestant such as Mucinex D or if you have glaucoma or high blood pressure use plain Mucinex. Saline nasal spray help and can safely be used as often as needed for congestion, I have prescribed: Fluticasone nasal spray two sprays in each nostril once a day  Some authorities believe that zinc sprays or the use of Echinacea may shorten the course of your symptoms.  Sinus infections are not as easily transmitted as other respiratory infection, however we still recommend that you avoid close contact with loved ones, especially the very young and elderly.  Remember to wash your hands thoroughly throughout the day as this is the number one way to prevent the spread of infection!  Home Care:  Only take medications as instructed by your medical team.  Do not take these medications with alcohol.  A steam or ultrasonic humidifier can help congestion.  You can place a towel over your head and breathe in the steam from hot water coming from a faucet.  Avoid close contacts especially the very young and the elderly.  Cover your mouth when you cough or sneeze.  Always remember to wash your hands.  Get Help Right Away If:  You develop worsening fever or sinus pain.  You develop a severe head ache or visual changes.  Your symptoms persist after you have completed your treatment plan.  Make sure you  Understand these instructions.  Will watch your condition.  Will get help right away if you are not  doing well or get worse.  Your e-visit answers were reviewed by a board certified advanced clinical practitioner to complete your personal care plan.  Depending on the condition, your plan could have included both over the counter or prescription medications.  If there is a problem please reply  once you have received a response from your provider.  Your safety is important to us.  If you have drug allergies check your prescription carefully.    You can use MyChart to ask questions about today's visit, request a non-urgent call back, or ask for a work or school excuse for 24 hours related to this e-Visit. If it has been greater than 24 hours you will need to follow up with your provider, or enter a new e-Visit to address those concerns.  You will get an e-mail in the next two days asking about your experience.  I hope that your e-visit has been valuable and will speed your recovery. Thank you for using e-visits.  Greater than 5 minutes, yet less than 10 minutes of time have been spent researching, coordinating, and implementing care for this patient today.  Thank you for the details you included in the comment boxes. Those details are very helpful in determining the best course of treatment for you and help us to provide the best care.  

## 2018-11-12 LAB — MICROALBUMIN, URINE: Microalb, Ur: 22.6

## 2018-11-17 DIAGNOSIS — E119 Type 2 diabetes mellitus without complications: Secondary | ICD-10-CM | POA: Diagnosis not present

## 2018-11-24 DIAGNOSIS — E119 Type 2 diabetes mellitus without complications: Secondary | ICD-10-CM | POA: Diagnosis not present

## 2018-11-24 DIAGNOSIS — Z794 Long term (current) use of insulin: Secondary | ICD-10-CM | POA: Diagnosis not present

## 2019-01-30 IMAGING — MG MM DIGITAL SCREENING BILAT W/ CAD
4 series · 4 of 4 positions shown · non-contrast
Comparison: Previous exam(s).

CLINICAL DATA: Screening.

EXAM:
DIGITAL SCREENING BILATERAL MAMMOGRAM WITH CAD

[L CC]
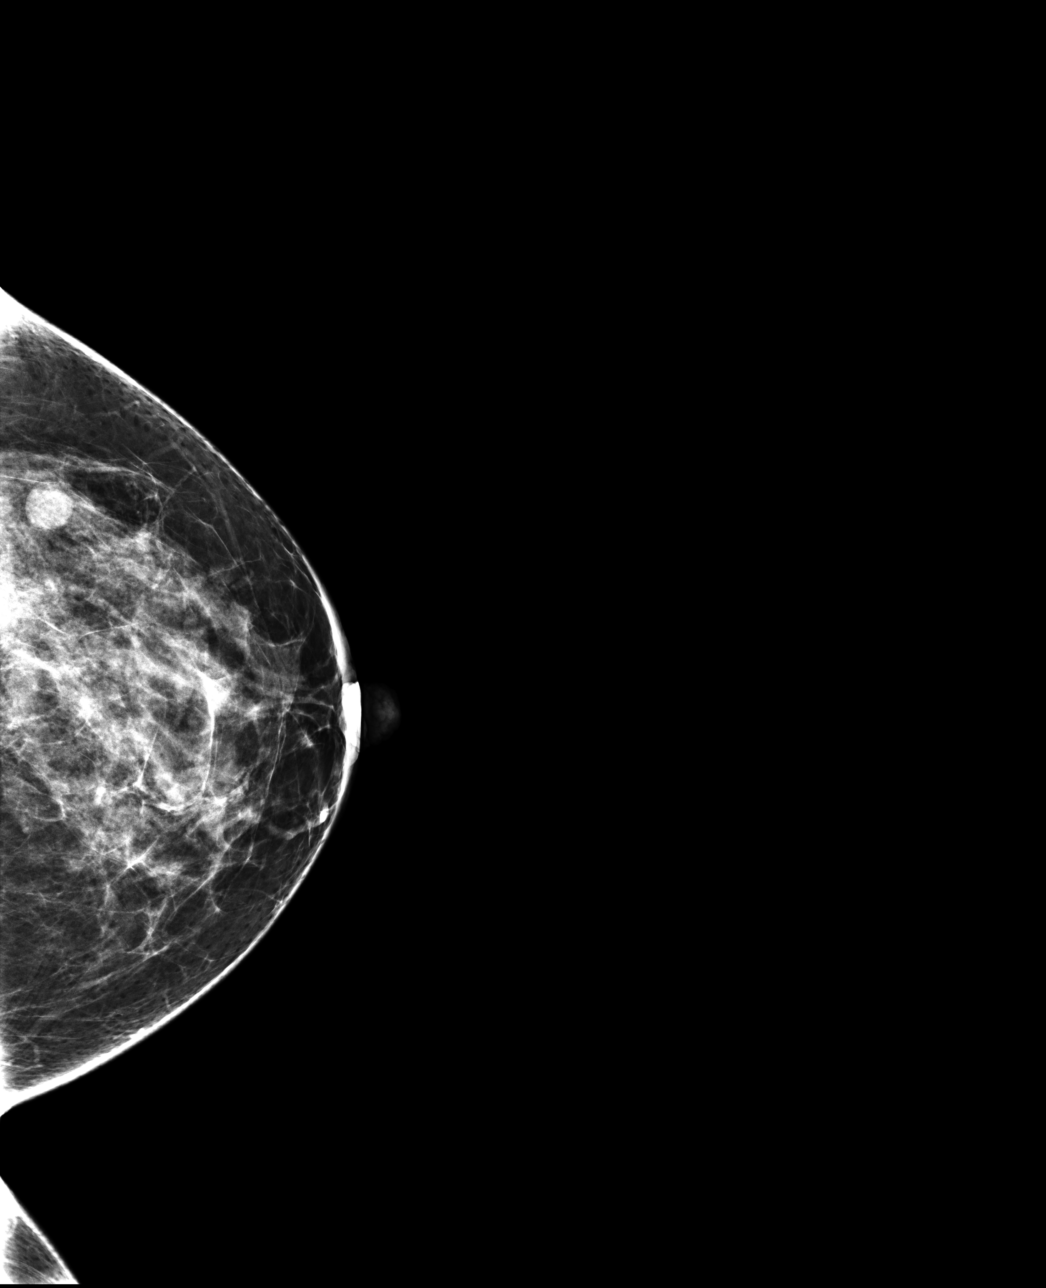

[R CC]
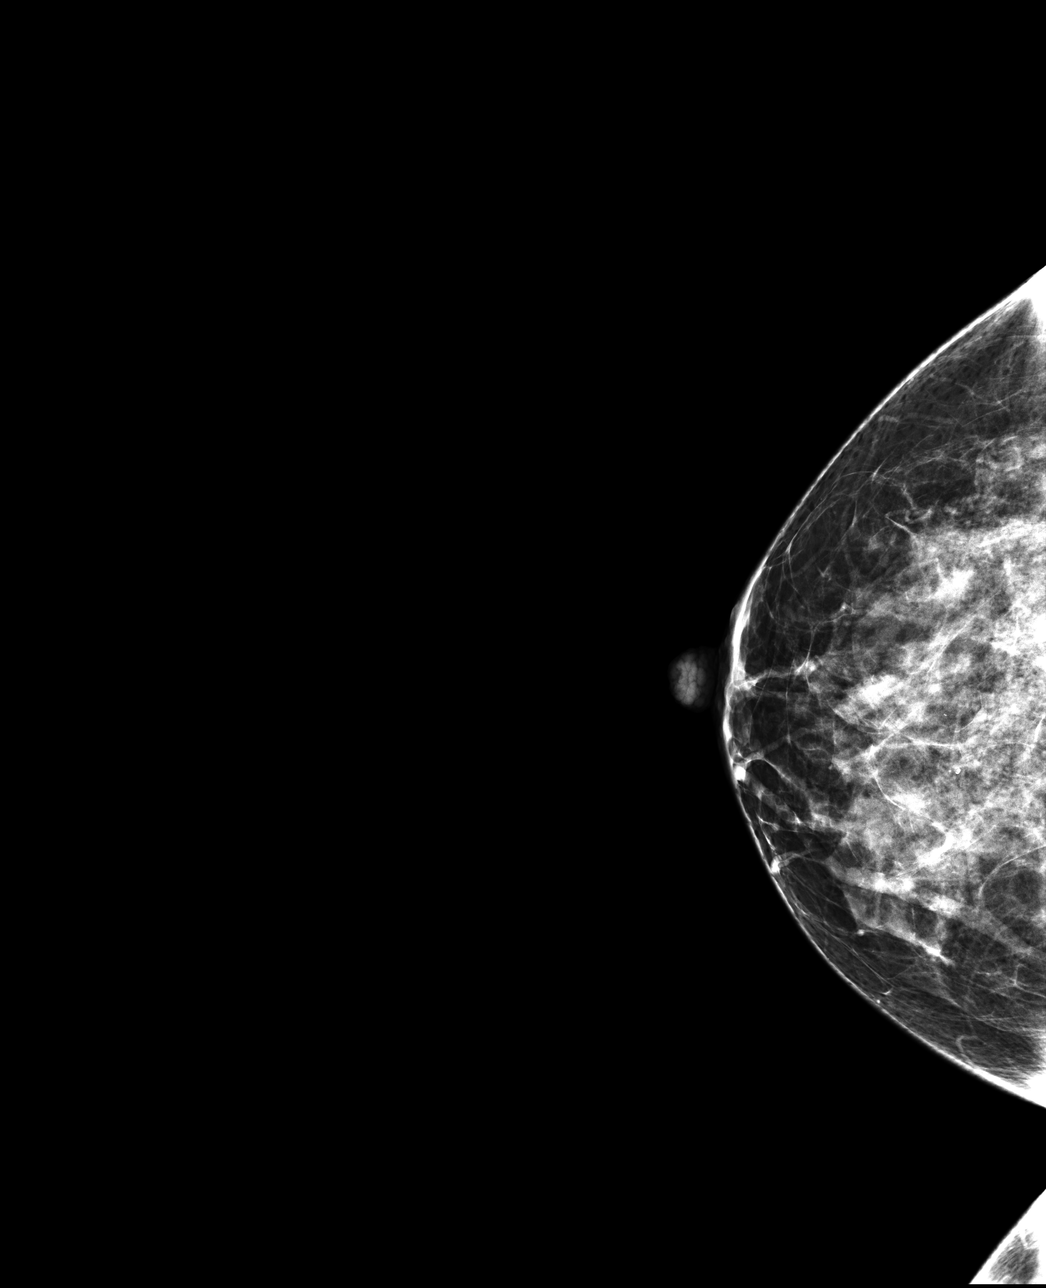

[L MLO]
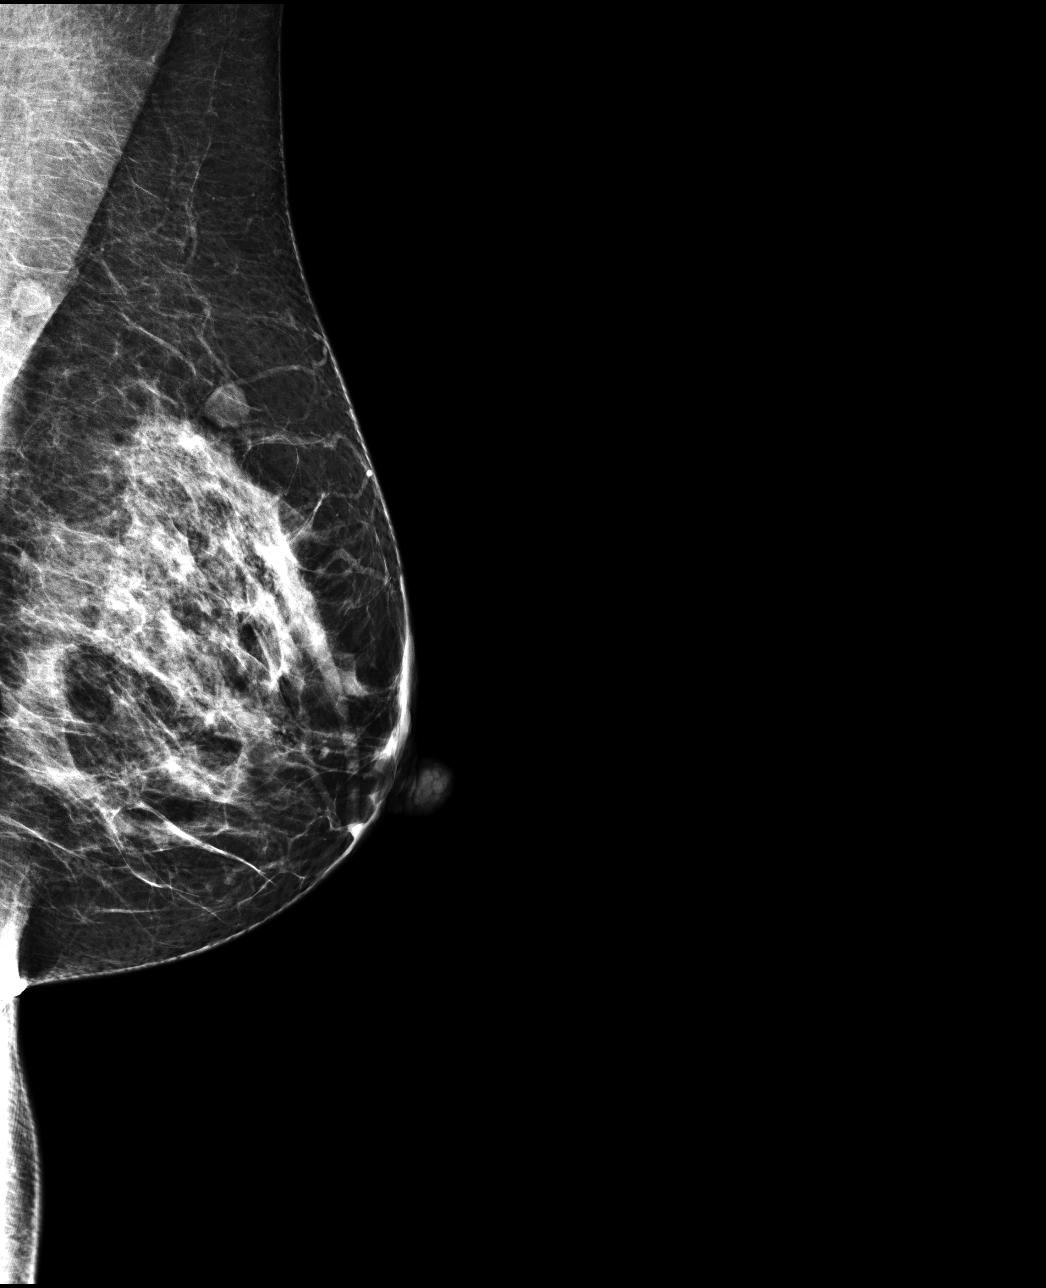

[R MLO]
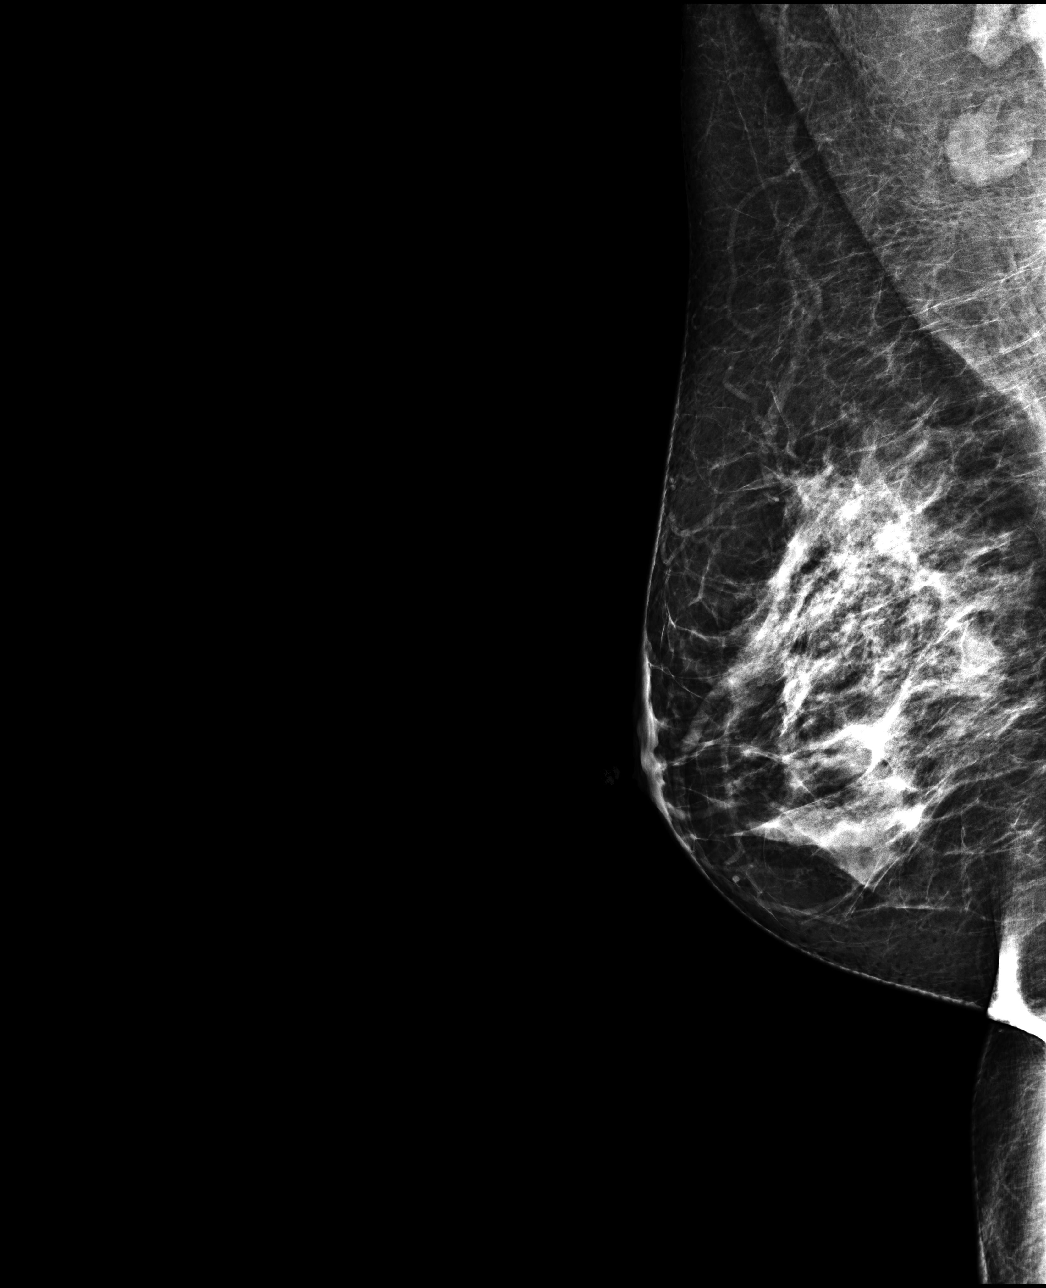

[4 of 4 positions shown; findings below may reference images not displayed]

ACR Breast Density Category c: The breast tissue is heterogeneously
dense, which may obscure small masses.
FINDINGS: There are no findings suspicious for malignancy. Images were
processed with CAD.
IMPRESSION: No mammographic evidence of malignancy. A result letter of this
screening mammogram will be mailed directly to the patient.

RECOMMENDATION:
Screening mammogram in one year. (Code:[id number]-[id number])

BI-RADS CATEGORY  1: Negative.

## 2019-03-09 DIAGNOSIS — E119 Type 2 diabetes mellitus without complications: Secondary | ICD-10-CM

## 2019-03-11 ENCOUNTER — Telehealth: Payer: Self-pay | Admitting: Internal Medicine

## 2019-03-11 NOTE — Telephone Encounter (Signed)
Patient has appt in feb,  Change in insurance requires new referral

## 2019-04-30 ENCOUNTER — Telehealth: Payer: Self-pay | Admitting: Internal Medicine

## 2019-04-30 DIAGNOSIS — E611 Iron deficiency: Secondary | ICD-10-CM

## 2019-04-30 NOTE — Telephone Encounter (Signed)
Is it okay to order [place] studies and a cbc so she can have them done before her appt with Fransico Setters on 05/07/2019?

## 2019-04-30 NOTE — Telephone Encounter (Signed)
pt feels like her Iron levels are low and would like to have that checked and a CBC drawn Pt made an appt with Judithann Graves for 3/11

## 2019-04-30 NOTE — Telephone Encounter (Signed)
Ordered iron and cbc

## 2019-04-30 NOTE — Addendum Note (Signed)
Addended by: Sherlene Shams on: 04/30/2019 05:48 PM   Modules accepted: Orders

## 2019-05-01 NOTE — Telephone Encounter (Signed)
Pt has been scheduled for a lab appt on 05/04/2019. Pt is aware of appt date and time.

## 2019-05-04 ENCOUNTER — Other Ambulatory Visit: Payer: Self-pay

## 2019-05-04 ENCOUNTER — Other Ambulatory Visit (INDEPENDENT_AMBULATORY_CARE_PROVIDER_SITE_OTHER): Payer: No Typology Code available for payment source

## 2019-05-04 DIAGNOSIS — E611 Iron deficiency: Secondary | ICD-10-CM

## 2019-05-05 LAB — CBC WITH DIFFERENTIAL/PLATELET
Basophils Absolute: 0.1 10*3/uL (ref 0.0–0.1)
Basophils Relative: 1.2 % (ref 0.0–3.0)
Eosinophils Absolute: 0.2 10*3/uL (ref 0.0–0.7)
Eosinophils Relative: 2.3 % (ref 0.0–5.0)
HCT: 41.6 % (ref 36.0–46.0)
Hemoglobin: 13.8 g/dL (ref 12.0–15.0)
Lymphocytes Relative: 41.8 % (ref 12.0–46.0)
Lymphs Abs: 4.4 10*3/uL — ABNORMAL HIGH (ref 0.7–4.0)
MCHC: 33.1 g/dL (ref 30.0–36.0)
MCV: 81.8 fl (ref 78.0–100.0)
Monocytes Absolute: 0.7 10*3/uL (ref 0.1–1.0)
Monocytes Relative: 6.3 % (ref 3.0–12.0)
Neutro Abs: 5.1 10*3/uL (ref 1.4–7.7)
Neutrophils Relative %: 48.4 % (ref 43.0–77.0)
Platelets: 266 10*3/uL (ref 150.0–400.0)
RBC: 5.08 Mil/uL (ref 3.87–5.11)
RDW: 14.1 % (ref 11.5–15.5)
WBC: 10.4 10*3/uL (ref 4.0–10.5)

## 2019-05-07 ENCOUNTER — Ambulatory Visit: Payer: 59 | Admitting: Nurse Practitioner

## 2019-05-08 ENCOUNTER — Other Ambulatory Visit: Payer: Self-pay

## 2019-05-08 ENCOUNTER — Ambulatory Visit: Payer: No Typology Code available for payment source | Admitting: Nurse Practitioner

## 2019-05-08 ENCOUNTER — Encounter: Payer: Self-pay | Admitting: Nurse Practitioner

## 2019-05-08 VITALS — BP 108/66 | HR 98 | Temp 97.6°F | Ht 67.0 in | Wt 189.0 lb

## 2019-05-08 DIAGNOSIS — R0600 Dyspnea, unspecified: Secondary | ICD-10-CM

## 2019-05-08 DIAGNOSIS — D509 Iron deficiency anemia, unspecified: Secondary | ICD-10-CM

## 2019-05-08 DIAGNOSIS — R0609 Other forms of dyspnea: Secondary | ICD-10-CM

## 2019-05-08 DIAGNOSIS — E611 Iron deficiency: Secondary | ICD-10-CM

## 2019-05-08 DIAGNOSIS — E509 Vitamin A deficiency, unspecified: Secondary | ICD-10-CM

## 2019-05-08 NOTE — Patient Instructions (Addendum)
It was a pleasure meeting you today.  Please go to the lab today.  I placed a referral to Dr. Mariah Milling in Cardiology for your shortness of breath despite having a normal Hgb

## 2019-05-08 NOTE — Progress Notes (Signed)
Established Patient Office Visit  Subjective:  Patient ID: Melissa Werner, female    DOB: [birth year]  Age: 54 y.o. MRN: [id number]  CC:  Chief Complaint  Patient presents with  . Follow up on anemia. She is getting mild DOE and wants to know if her Hgb has dropped again.    HPI Melissa Werner presents for IDA:  She is experiencing mild DOA again and is wondering if she is anemic. This was her first symptom back in 2017 when her anemia was found. She denies any CP and has seen no blood or melena. Her  menses stopped Oct  t2019. Colonoscopy was  normal in 2017. FH negative cancer . No UGI complaints and takes a rare NSAID.  She takes iron every other day since 2017.  Past Medical History:  Diagnosis Date  . Anemia    iron deficiency  . Diabetes mellitus   . GERD (gastroesophageal reflux disease)   . History of abnormal mammogram 2008   negative 2016  . Hyperlipidemia     Past Surgical History:  Procedure Laterality Date  . COLONOSCOPY WITH PROPOFOL N/A 02/03/2016   Procedure: COLONOSCOPY WITH PROPOFOL;  Surgeon: Manya Silvas, MD;  Location: [facility] ENDOSCOPY;  Service: Endoscopy;  Laterality: N/A;  . ESOPHAGOGASTRODUODENOSCOPY (EGD) WITH PROPOFOL N/A 02/03/2016   Procedure: ESOPHAGOGASTRODUODENOSCOPY (EGD) WITH PROPOFOL;  Surgeon: Manya Silvas, MD;  Location: [facility] ENDOSCOPY;  Service: Endoscopy;  Laterality: N/A;  . TONSILLECTOMY    . WISDOM TOOTH EXTRACTION      Family History  Problem Relation Age of Onset  . Hyperlipidemia Father   . Heart disease Father   . Breast cancer Maternal Aunt 55    Social History   Socioeconomic History  . Marital status: Married    Spouse name: Not on file  . Number of children: Not on file  . Years of education: Not on file  . Highest education level: Not on file  Occupational History  . Not on file  Tobacco Use  . Smoking status: Never Smoker  . Smokeless tobacco: Never Used  Substance and Sexual Activity  . Alcohol use: No   . Drug use: No  . Sexual activity: Yes    Birth control/protection: None  Other Topics Concern  . Not on file  Social History Narrative  . Not on file   Social Determinants of Health   Financial Resource Strain:   . Difficulty of Paying Living Expenses:   Food Insecurity:   . Worried About Charity fundraiser in the Last Year:   . Arboriculturist in the Last Year:   Transportation Needs:   . Film/video editor (Medical):   Marland Kitchen Lack of Transportation (Non-Medical):   Physical Activity:   . Days of Exercise per Week:   . Minutes of Exercise per Session:   Stress:   . Feeling of Stress :   Social Connections:   . Frequency of Communication with Friends and Family:   . Frequency of Social Gatherings with Friends and Family:   . Attends Religious Services:   . Active Member of Clubs or Organizations:   . Attends Archivist Meetings:   Marland Kitchen Marital Status:   Intimate Partner Violence:   . Fear of Current or Ex-Partner:   . Emotionally Abused:   Marland Kitchen Physically Abused:   . Sexually Abused:     Outpatient Medications Prior to Visit  Medication Sig Dispense Refill  . ferrous sulfate 325 (65  FE) MG tablet Take 1 tablet (325 mg total) by mouth 2 (two) times daily with a meal. 60 tablet 0  . glipiZIDE (GLUCOTROL) 10 MG tablet Take 10 mg by mouth daily before breakfast.     . glucose blood test strip Use once daily to check cbg   DM type 2 100 each 1  . Insulin Glargine (LANTUS SOLOSTAR) 100 UNIT/ML Solostar Pen Take 20 units subcutaneously each night    . metformin (FORTAMET) 1000 MG (OSM) 24 hr tablet metformin ER 1,000 mg tablet,extended release 24hr    . omeprazole (PRILOSEC) 20 MG capsule Take 20 mg by mouth daily.    . ONE TOUCH ULTRA TEST test strip USE 3 TIMES DAILY TO CHECK BLOOD SUGAR. 100 each 2  . Semaglutide, 1 MG/DOSE, 2 MG/1.5ML SOPN Inject into the skin.    Marland Kitchen UNIFINE PENTIPS 31G X 5 MM MISC     . fluticasone (FLONASE) 50 MCG/ACT nasal spray Place 2 sprays  into both nostrils daily. 16 g 0   No facility-administered medications prior to visit.    Allergies  Allergen Reactions  . Tetanus Toxoids     high fevers, systemic illness     ROS Review of Systems Pertinent positives noted in HPI otherwise negative.    Objective:    Physical Exam  Constitutional: She is oriented to person, place, and time. She appears well-developed and well-nourished.  Cardiovascular: Normal rate, regular rhythm and normal heart sounds.  No murmur heard. Pulmonary/Chest: Effort normal and breath sounds normal.  Abdominal: Soft. Bowel sounds are normal.  Musculoskeletal:        General: Normal range of motion.  Neurological: She is alert and oriented to person, place, and time.  Skin: Skin is warm and dry.  Psychiatric: She has a normal mood and affect. Her behavior is normal.  Vitals reviewed.  Neg  BP 108/66   Pulse 98   Temp 97.6 F (36.4 C) (Skin)   Ht 5\' 7"  (1.702 m)   Wt 189 lb (85.7 kg)   SpO2 95%   BMI 29.60 kg/m    Lab Results  Component Value Date   WBC 10.4 05/04/2019   HGB 13.8 05/04/2019   HCT 41.6 05/04/2019   MCV 81.8 05/04/2019   PLT 266.0 05/04/2019   Lab Results  Component Value Date   NA 141 08/11/2018   K 4.1 08/11/2018   CO2 30 08/11/2018   GLUCOSE 82 08/11/2018   BUN 10 08/11/2018   CREATININE 0.63 08/11/2018   BILITOT 0.2 08/11/2018   ALKPHOS 69 08/11/2018   AST 12 08/11/2018   ALT 13 08/11/2018   PROT 6.4 08/11/2018   ALBUMIN 4.3 08/11/2018   CALCIUM 9.5 08/11/2018   ANIONGAP 6 11/11/2015   GFR 99.07 08/11/2018      Assessment & Plan:   Problem List Items Addressed This Visit      Other   Dietary iron deficiency without anemia - Primary   Relevant Orders   Celiac Disease Ab Screen w/Rfx (Completed)   Iron, TIBC and Ferritin Panel (Completed)    Other Visit Diagnoses    Iron deficiency anemia, unspecified iron deficiency anemia type       DOE (dyspnea on exertion)       Relevant Orders    Ambulatory referral to Cardiology     Please go to the lab today to check the iron stores and anemia.   I placed a referral to Dr. 08/13/2018 in Cardiology for your shortness of  breath despite having a normal Hgb 13.8. I cannot explain your DOE with the labs.   Addendum: iron stores shows ferritin at 57 from 7.3 in 2018- WNL. Iron serum at 45, iron sat is still a little low at 13%- remain on iron QOD. Celiac disease can give anemia and will check markers.   Follow-up: Return in about 3 months (around 08/08/2019) for CPE with Dr. Darrick Huntsman in June at your convenience.   This visit occurred during the SARS-CoV-2 public health emergency.  Safety protocols were in place, including screening questions prior to the visit, additional usage of staff PPE, and extensive cleaning of exam room while observing appropriate contact time as indicated for disinfecting solutions.   Amedeo Kinsman, NP

## 2019-05-12 LAB — IRON,TIBC AND FERRITIN PANEL
Ferritin: 57 ng/mL (ref 15–150)
Iron Saturation: 13 % — ABNORMAL LOW (ref 15–55)
Iron: 45 ug/dL (ref 27–159)
Total Iron Binding Capacity: 338 ug/dL (ref 250–450)
UIBC: 293 ug/dL (ref 131–425)

## 2019-05-12 LAB — CELIAC DISEASE AB SCREEN W/RFX
Antigliadin Abs, IgA: 5 units (ref 0–19)
Transglutaminase IgA: <2 U/mL (ref 0–3)

## 2019-05-24 NOTE — Progress Notes (Signed)
Cardiology Office Note  Date:  05/25/2019   ID:  UMA JERDE, DOB Jun 30, 1965, MRN [id number]  PCP:  Melissa Mc, MD   Chief Complaint  Patient presents with  . New Patient (Initial Visit)    DOE; Meds verbally reviewed with patient.    HPI:  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  Prior records reviewed Echo 10/2015 - Left ventricle: The cavity size was normal. Systolic function was  normal. The estimated ejection fraction was in the range of 60%  to 65%. Wall motion was normal; there were no regional wall  motion abnormalities. Doppler parameters are consistent with  abnormal left ventricular relaxation (grade 1 diastolic  dysfunction).  - Aortic valve: There was trivial regurgitation.  - Mitral valve: There was mild regurgitation.  - Right ventricle: Systolic function was normal.  - Pulmonary arteries: Systolic pressure was within the normal  range.  - Normal study.   Prior office notes with details as below Weight 189 pounds Weight in 12/2016: 178 pounds  On further discussion she reports that she Goes to exercise class 2x a week Classes as a mixture of weight training, treadmill , "burn" Also runs,  Heart rate monitoring, Max run 140 -150 beats per minute Slow run 120-  Lab work reviewed LDL 100 Total chol 66  Father with CAD, MI 54 yo with CABG  EKG personally reviewed by myself on todays visit Shows normal sinus rhythm rate 93 bpm no significant ST-T wave changes, rare PVC   PMH:   has a past medical history of Anemia, Diabetes mellitus, GERD (gastroesophageal reflux disease), History of abnormal mammogram (2008), and Hyperlipidemia.  PSH:    Past Surgical History:  Procedure Laterality Date  . COLONOSCOPY WITH  PROPOFOL N/A 02/03/2016   Procedure: COLONOSCOPY WITH PROPOFOL;  Surgeon: Melissa Silvas, MD;  Location: [facility] ENDOSCOPY;  Service: Endoscopy;  Laterality: N/A;  . ESOPHAGOGASTRODUODENOSCOPY (EGD) WITH PROPOFOL N/A 02/03/2016   Procedure: ESOPHAGOGASTRODUODENOSCOPY (EGD) WITH PROPOFOL;  Surgeon: Melissa Silvas, MD;  Location: [facility] ENDOSCOPY;  Service: Endoscopy;  Laterality: N/A;  . TONSILLECTOMY    . WISDOM TOOTH EXTRACTION      Current Outpatient Medications  Medication Sig Dispense Refill  . ferrous sulfate 325 (65 FE) MG tablet Take 1 tablet (325 mg total) by mouth 2 (two) times daily with a meal. 60 tablet 0  . glipiZIDE (GLUCOTROL) 10 MG tablet Take 10 mg by mouth daily before breakfast.     . glucose blood test strip Use once daily to check cbg   DM type 2 100 each 1  . Insulin Glargine (LANTUS SOLOSTAR) 100 UNIT/ML Solostar Pen Take 20 units subcutaneously each night    . metformin (FORTAMET) 1000 MG (OSM) 24 hr tablet metformin ER 1,000 mg tablet,extended release 24hr    . omeprazole (PRILOSEC) 20 MG capsule Take 20 mg by mouth daily.    . ONE TOUCH ULTRA TEST test strip USE 3 TIMES DAILY TO CHECK BLOOD SUGAR. 100 each 2  . Semaglutide, 1 MG/DOSE, 2 MG/1.5ML SOPN Inject into the skin.    Marland Kitchen UNIFINE PENTIPS 31G X 5 MM MISC     . VICTOZA 18 MG/3ML SOPN Inject 1.8 mg into the skin daily.     No current facility-administered medications  for this visit.    Allergies:   Tetanus toxoids   Social History:  The patient  reports that she has never smoked. She has never used smokeless tobacco. She reports that she does not drink alcohol or use drugs.   Family History:   family history includes Breast cancer (age of onset: 69) in her maternal aunt; Heart disease in her father; Hyperlipidemia in her father.    Review of Systems: Review of Systems  Constitutional: Negative.   HENT: Negative.   Respiratory: Positive for shortness of breath.   Cardiovascular: Negative.         Tachycardia  Gastrointestinal: Negative.   Musculoskeletal: Negative.   Neurological: Negative.   Psychiatric/Behavioral: Negative.   All other systems reviewed and are negative.   PHYSICAL EXAM: VS:  BP 136/88 (BP Location: Right Arm, Patient Position: Sitting, Cuff Size: Normal)   Pulse 93   Ht 5\' 7"  (1.702 m)   Wt 189 lb (85.7 kg)   SpO2 96%   BMI 29.60 kg/m  , BMI Body mass index is 29.6 kg/m. GEN: Well nourished, well developed, in no acute distress HEENT: normal Neck: no JVD, carotid bruits, or masses Cardiac: RRR; no murmurs, rubs, or gallops,no edema  Respiratory:  clear to auscultation bilaterally, normal work of breathing GI: soft, nontender, nondistended, + BS MS: no deformity or atrophy Skin: warm and dry, no rash Neuro:  Strength and sensation are intact Psych: euthymic mood, full affect   Recent Labs: 08/11/2018: ALT 13; BUN 10; Creatinine, Ser 0.63; Potassium 4.1; Sodium 141 05/04/2019: Hemoglobin 13.8; Platelets 266.0    Lipid Panel Lab Results  Component Value Date   CHOL 179 08/11/2018   HDL 47.40 08/11/2018   LDLCALC 51 11/25/2014   TRIG 296.0 (H) 08/11/2018      Wt Readings from Last 3 Encounters:  05/25/19 189 lb (85.7 kg)  05/08/19 189 lb (85.7 kg)  08/11/18 198 lb 3.2 oz (89.9 kg)       ASSESSMENT AND PLAN:  Problem List Items Addressed This Visit      Cardiology Problems   Hyperlipidemia     Other   DOE (dyspnea on exertion) - Primary   Relevant Orders   EKG 12-Lead   Diabetes mellitus without complication (HCC)   Relevant Medications   VICTOZA 18 MG/3ML SOPN   Overweight (BMI 25.0-29.9)    Other Visit Diagnoses    Tachycardia       Relevant Orders   EKG 12-Lead     Shortness of breath/tachycardia Symptoms more notable climbing hills and stairs Faster heart rate walking up the stairs in the morning Noticed when she runs and she gets to a hill symptoms are more pronounced -EKG normal, benign clinical exam -Although she  is exercising, suspect symptoms are secondary to conditioning Testing can be done including stress testing, schedule stress echo, CT coronary calcium scoring Also discussed a monitor/Zio No strong indication for medications at this time She would like to avoid additional testing at this time Recommend she continue her regular exercise program and call our office if symptoms get worse  Disposition:   F/U  12 months   Total encounter time more than 45 minutes  Greater than 50% was spent in counseling and coordination of care with the patient    Signed, 12-29-2000, M.D., Ph.D. [facility] [address] [place]

## 2019-05-25 ENCOUNTER — Encounter: Payer: Self-pay | Admitting: Cardiovascular Disease

## 2019-05-25 ENCOUNTER — Other Ambulatory Visit: Payer: Self-pay

## 2019-05-25 ENCOUNTER — Other Ambulatory Visit: Payer: Self-pay | Admitting: Internal Medicine

## 2019-05-25 ENCOUNTER — Ambulatory Visit (INDEPENDENT_AMBULATORY_CARE_PROVIDER_SITE_OTHER): Payer: No Typology Code available for payment source | Admitting: Cardiovascular Disease

## 2019-05-25 VITALS — BP 136/88 | HR 93 | Ht 67.0 in | Wt 189.0 lb

## 2019-05-25 DIAGNOSIS — E782 Mixed hyperlipidemia: Secondary | ICD-10-CM | POA: Diagnosis not present

## 2019-05-25 DIAGNOSIS — E119 Type 2 diabetes mellitus without complications: Secondary | ICD-10-CM

## 2019-05-25 DIAGNOSIS — R000 Tachycardia, unspecified: Secondary | ICD-10-CM

## 2019-05-25 DIAGNOSIS — E663 Overweight: Secondary | ICD-10-CM | POA: Diagnosis not present

## 2019-05-25 DIAGNOSIS — R0609 Other forms of dyspnea: Secondary | ICD-10-CM

## 2019-05-25 DIAGNOSIS — R0600 Dyspnea, unspecified: Secondary | ICD-10-CM | POA: Diagnosis not present

## 2019-05-25 NOTE — Patient Instructions (Signed)
Think about: CT coronary calcium scoring, looks for blockages --echo --treadmill stress echo ZIO monitor, looks at electrical , tachycardia   Medication Instructions:  No changes  If you need a refill on your cardiac medications before your next appointment, please call your pharmacy.    Lab work: No new labs needed   If you have labs (blood work) drawn today and your tests are completely normal, you will receive your results only by: Marland Kitchen MyChart Message (if you have MyChart) OR . A paper copy in the mail If you have any lab test that is abnormal or we need to change your treatment, we will call you to review the results.   Testing/Procedures: No new testing needed   Follow-Up: At [facility], you and your health needs are our priority.  As part of our continuing mission to provide you with exceptional heart care, we have created designated Provider Care Teams.  These Care Teams include your primary Cardiologist (physician) and Advanced Practice Providers (APPs -  Physician Assistants and Nurse Practitioners) who all work together to provide you with the care you need, when you need it.  . You will need a follow up appointment as needed   . Providers on your designated Care Team:   . Nicolasa Ducking, NP . Eula Listen, PA-C . Marisue Ivan, PA-C  Any Other Special Instructions Will Be Listed Below (If Applicable).  For educational health videos Log in to : [email or web address] Or : [email or web address], password : [email or web address]

## 2019-07-30 ENCOUNTER — Other Ambulatory Visit: Payer: Self-pay | Admitting: Internal Medicine

## 2019-08-05 ENCOUNTER — Other Ambulatory Visit: Payer: Self-pay | Admitting: Internal Medicine

## 2019-08-06 ENCOUNTER — Other Ambulatory Visit: Payer: Self-pay | Admitting: Internal Medicine

## 2019-08-06 LAB — HEMOGLOBIN A1C: Hemoglobin A1C: 8.1

## 2019-08-12 ENCOUNTER — Other Ambulatory Visit: Payer: Self-pay

## 2019-08-12 ENCOUNTER — Ambulatory Visit (INDEPENDENT_AMBULATORY_CARE_PROVIDER_SITE_OTHER): Payer: No Typology Code available for payment source | Admitting: Internal Medicine

## 2019-08-12 ENCOUNTER — Encounter: Payer: Self-pay | Admitting: Internal Medicine

## 2019-08-12 VITALS — BP 122/74 | HR 78 | Temp 97.9°F | Resp 14 | Ht 67.0 in | Wt 187.0 lb

## 2019-08-12 DIAGNOSIS — Z1231 Encounter for screening mammogram for malignant neoplasm of breast: Secondary | ICD-10-CM | POA: Diagnosis not present

## 2019-08-12 DIAGNOSIS — Z0000 Encounter for general adult medical examination without abnormal findings: Secondary | ICD-10-CM | POA: Insufficient documentation

## 2019-08-12 NOTE — Assessment & Plan Note (Signed)

## 2019-08-12 NOTE — Progress Notes (Signed)
Patient ID: Melissa Werner, female    DOB: Feb 09, 1966  Age: 54 y.o. MRN: [id number]  The patient is here for annual  wellness examination and management of other chronic and acute problems.   This visit occurred during the SARS-CoV-2 public health emergency.  Safety protocols were in place, including screening questions prior to the visit, additional usage of staff PPE, and extensive cleaning of exam room while observing appropriate contact time as indicated for disinfecting solutions.    The risk factors are reflected in the social history.  The roster of all physicians providing medical care to patient - is listed in the Snapshot section of the chart.  Activities of daily living:  The patient is 100% independent in all ADLs: dressing, toileting, feeding as well as independent mobility  Home safety : The patient Werner smoke detectors in the home. They wear seatbelts.  There are no firearms at home. There is no violence in the home.   There is no risks for hepatitis, STDs or HIV. There is no   history of blood transfusion. They have no travel history to infectious disease endemic areas of the world.  The patient Werner seen their dentist in the last six month. They have seen their eye doctor in the last year. They deny hearing difficulty with regard to whispered voices and some television programs.  They have deferred audiologic testing in the last year.  They do not  have excessive sun exposure. Discussed the need for sun protection: hats, long sleeves and use of sunscreen if there is significant sun exposure.   Diet: the importance of a healthy diet is discussed. They do have a healthy diet.  The benefits of regular aerobic exercise were discussed. Melissa Werner exercises 4 times per week , 60  minutes.   Depression screen: there are no signs or vegative symptoms of depression- irritability, change in appetite, anhedonia, sadness/tearfullness.  The following portions of the patient's history were reviewed  and updated as appropriate: allergies, current medications, past family history, past medical history,  past surgical history, past social history  and problem list.  Visual acuity was not assessed per patient preference since Melissa Werner Werner regular follow up with her ophthalmologist. Hearing and body mass index were assessed and reviewed.   During the course of the visit the patient was educated and counseled about appropriate screening and preventive services including : fall prevention , diabetes screening, nutrition counseling, colorectal cancer screening, and recommended immunizations.    CC: The primary encounter diagnosis was Encounter for screening mammogram for malignant neoplasm of breast. A diagnosis of Encounter for preventive health examination was also pertinent to this visit.  No issues discussed today.  History Melissa Werner a past medical history of Anemia, Diabetes mellitus, GERD (gastroesophageal reflux disease), History of abnormal mammogram (2008), and Hyperlipidemia.   Melissa Werner Werner a past surgical history that includes Tonsillectomy; Wisdom tooth extraction; Colonoscopy with propofol (N/A, 02/03/2016); and Esophagogastroduodenoscopy (egd) with propofol (N/A, 02/03/2016).   Her family history includes Breast cancer (age of onset: 2) in her maternal aunt; Heart disease in her father; Hyperlipidemia in her father.Melissa Werner reports that Melissa Werner Werner never smoked. Melissa Werner Werner never used smokeless tobacco. Melissa Werner reports that Melissa Werner does not drink alcohol and does not use drugs.  Outpatient Medications Prior to Visit  Medication Sig Dispense Refill  . ferrous sulfate 325 (65 FE) MG tablet Take 1 tablet (325 mg total) by mouth 2 (two) times daily with a meal. 60 tablet 0  . glipiZIDE (GLUCOTROL)  10 MG tablet Take 10 mg by mouth daily before breakfast.     . glucose blood test strip Use once daily to check cbg   DM type 2 100 each 1  . HUMALOG KWIKPEN 100 UNIT/ML KwikPen     . Insulin Glargine (LANTUS SOLOSTAR) 100  UNIT/ML Solostar Pen Take 20 units subcutaneously each night    . metFORMIN (GLUCOPHAGE-XR) 500 MG 24 hr tablet Take 1,000 mg by mouth 2 (two) times daily.    Marland Kitchen omeprazole (PRILOSEC) 20 MG capsule Take 20 mg by mouth daily.    . ONE TOUCH ULTRA TEST test strip USE 3 TIMES DAILY TO CHECK BLOOD SUGAR. 100 each 2  . Semaglutide, 1 MG/DOSE, 2 MG/1.5ML SOPN Inject into the skin.    Marland Kitchen UNIFINE PENTIPS 31G X 5 MM MISC     . VICTOZA 18 MG/3ML SOPN Inject 1.8 mg into the skin daily.    . metformin (FORTAMET) 1000 MG (OSM) 24 hr tablet metformin ER 1,000 mg tablet,extended release 24hr (Patient not taking: Reported on 08/12/2019)     No facility-administered medications prior to visit.    Review of Systems   Patient denies headache, fevers, malaise, unintentional weight loss, skin rash, eye pain, sinus congestion and sinus pain, sore throat, dysphagia,  hemoptysis , cough, dyspnea, wheezing, chest pain, palpitations, orthopnea, edema, abdominal pain, nausea, melena, diarrhea, constipation, flank pain, dysuria, hematuria, urinary  Frequency, nocturia, numbness, tingling, seizures,  Focal weakness, Loss of consciousness,  Tremor, insomnia, depression, anxiety, and suicidal ideation.      Objective:  BP 122/74 (BP Location: Left Arm, Patient Position: Sitting, Cuff Size: Normal)   Pulse 78   Temp 97.9 F (36.6 C) (Temporal)   Resp 14   Ht 5\' 7"  (1.702 m)   Wt 187 lb (84.8 kg)   SpO2 98%   BMI 29.29 kg/m   Physical Exam  General appearance: alert, cooperative and appears stated age Ears: normal TM's and external ear canals both ears Throat: lips, mucosa, and tongue normal; teeth and gums normal Neck: no adenopathy, no carotid bruit, supple, symmetrical, trachea midline and thyroid not enlarged, symmetric, no tenderness/mass/nodules Back: symmetric, no curvature. ROM normal. No CVA tenderness. Lungs: clear to auscultation bilaterally Heart: regular rate and rhythm, S1, S2 normal, no murmur,  click, rub or gallop Abdomen: soft, non-tender; bowel sounds normal; no masses,  no organomegaly Pulses: 2+ and symmetric Skin: Skin color, texture, turgor normal. No rashes or lesions Lymph nodes: Cervical, supraclavicular, and axillary nodes normal.   Assessment & Plan:   Problem List Items Addressed This Visit      Unprioritized   Encounter for preventive health examination    age appropriate education and counseling updated, referrals for preventative services and immunizations addressed, dietary and smoking counseling addressed, most recent labs reviewed.  I have personally reviewed and have noted:  1) the patient's medical and social history 2) The pt's use of alcohol, tobacco, and illicit drugs 3) The patient's current medications and supplements 4) Functional ability including ADL's, fall risk, home safety risk, hearing and visual impairment 5) Diet and physical activities 6) Evidence for depression or mood disorder 7) The patient's height, weight, and BMI have been recorded in the chart  I have made referrals, and provided counseling and education based on review of the above       Other Visit Diagnoses    Encounter for screening mammogram for malignant neoplasm of breast    -  Primary   Relevant Orders  MM 3D SCREEN BREAST BILATERAL      I am having Melissa Werner maintain her glucose blood, omeprazole, ONE TOUCH ULTRA TEST, insulin glargine, glipiZIDE, ferrous sulfate, Semaglutide (1 MG/DOSE), Unifine Pentips, Victoza, HumaLOG KwikPen, and metFORMIN.  No orders of the defined types were placed in this encounter.   Medications Discontinued During This Encounter  Medication Reason  . metformin (FORTAMET) 1000 MG (OSM) 24 hr tablet Change in therapy    Follow-up: No follow-ups on file.   Sherlene Shams, MD

## 2019-08-12 NOTE — Patient Instructions (Signed)

## 2019-09-14 ENCOUNTER — Ambulatory Visit
Admission: RE | Admit: 2019-09-14 | Discharge: 2019-09-14 | Disposition: A | Discharge status: Home / Self Care | Payer: No Typology Code available for payment source | Source: Ambulatory Visit | Attending: Internal Medicine | Admitting: Internal Medicine

## 2019-09-14 DIAGNOSIS — Z1231 Encounter for screening mammogram for malignant neoplasm of breast: Secondary | ICD-10-CM | POA: Diagnosis present

## 2019-11-05 LAB — MICROALBUMIN, URINE: Microalb, Ur: 22.1

## 2020-01-15 ENCOUNTER — Other Ambulatory Visit: Payer: Self-pay | Admitting: Internal Medicine

## 2020-01-15 ENCOUNTER — Ambulatory Visit: Payer: No Typology Code available for payment source | Attending: Internal Medicine

## 2020-01-15 DIAGNOSIS — Z23 Encounter for immunization: Secondary | ICD-10-CM

## 2020-01-15 NOTE — Progress Notes (Signed)
   Covid-19 Vaccination Clinic  Name:  Melissa Werner    MRN: [id number] DOB: October 02, 1965  01/15/2020  [name] was observed post Covid-19 immunization for 15 minutes without incident. She was provided with Vaccine Information Sheet and instruction to access the V-Safe system.   [name] was instructed to call 911 with any severe reactions post vaccine: Marland Kitchen Difficulty breathing  . Swelling of face and throat  . A fast heartbeat  . A bad rash all over body  . Dizziness and weakness   Immunizations Administered    Name Date Dose VIS Date Route   Pfizer COVID-19 Vaccine 01/15/2020  1:49 PM 0.3 mL 12/16/2019 Intramuscular   Manufacturer: [employer], [employer]   Lot: [id number]   NDC: [address]-6

## 2020-02-12 ENCOUNTER — Ambulatory Visit: Payer: No Typology Code available for payment source

## 2020-04-05 DIAGNOSIS — E119 Type 2 diabetes mellitus without complications: Secondary | ICD-10-CM

## 2020-04-07 ENCOUNTER — Telehealth: Payer: Self-pay | Admitting: Internal Medicine

## 2020-04-07 NOTE — Telephone Encounter (Signed)
Rejection Reason - Other - No referral needed, this is an established pt of Dr. Tedd Sias, last seen on 03/02/2020." Lucita Ferrara said on Apr 07, 2020 10:38 AM  [facility]

## 2020-04-25 ENCOUNTER — Other Ambulatory Visit: Payer: Self-pay | Admitting: Internal Medicine

## 2020-05-12 LAB — LIPID PANEL
Cholesterol: 202 — AB (ref 0–200)
HDL: 54 (ref 35–70)
LDL Cholesterol: 107
Triglycerides: 210 — AB (ref 40–160)

## 2020-05-12 LAB — HEPATIC FUNCTION PANEL
ALT: 13 (ref 7–35)
AST: 13 (ref 13–35)
Alkaline Phosphatase: 75 (ref 25–125)
Bilirubin, Total: 0.7

## 2020-05-19 DIAGNOSIS — L578 Other skin changes due to chronic exposure to nonionizing radiation: Secondary | ICD-10-CM

## 2020-05-23 ENCOUNTER — Other Ambulatory Visit: Payer: Self-pay | Admitting: Internal Medicine

## 2020-05-23 NOTE — Addendum Note (Signed)
Addended by: Sherlene Shams on: 05/23/2020 01:14 PM   Modules accepted: Orders

## 2020-06-22 ENCOUNTER — Other Ambulatory Visit: Payer: Self-pay

## 2020-06-22 MED FILL — Metformin HCl Tab ER 24HR 500 MG: ORAL | 30 days supply | Qty: 120 | Fill #0 | Status: AC

## 2020-07-11 ENCOUNTER — Other Ambulatory Visit: Payer: Self-pay

## 2020-07-11 MED FILL — Glipizide Tab ER 24HR 10 MG: ORAL | 90 days supply | Qty: 90 | Fill #0 | Status: AC

## 2020-07-12 ENCOUNTER — Other Ambulatory Visit: Payer: Self-pay

## 2020-07-31 MED FILL — Metformin HCl Tab ER 24HR 500 MG: ORAL | 30 days supply | Qty: 120 | Fill #1 | Status: AC

## 2020-08-01 ENCOUNTER — Other Ambulatory Visit: Payer: Self-pay

## 2020-08-16 ENCOUNTER — Other Ambulatory Visit: Payer: Self-pay

## 2020-08-16 MED FILL — Insulin Glargine-yfgn Soln Pen-Injector 100 Unit/ML: SUBCUTANEOUS | 46 days supply | Qty: 15 | Fill #0 | Status: AC

## 2020-08-17 ENCOUNTER — Other Ambulatory Visit: Payer: Self-pay

## 2020-08-18 ENCOUNTER — Other Ambulatory Visit: Payer: Self-pay

## 2020-08-19 ENCOUNTER — Other Ambulatory Visit: Payer: Self-pay

## 2020-08-19 MED ORDER — VICTOZA 18 MG/3ML ~~LOC~~ SOPN
PEN_INJECTOR | SUBCUTANEOUS | 3 refills | Status: DC
  Filled 2020-08-19: qty 27, 90d supply, fill #0

## 2020-08-21 ENCOUNTER — Other Ambulatory Visit: Payer: Self-pay

## 2020-08-23 ENCOUNTER — Other Ambulatory Visit: Payer: Self-pay

## 2020-08-23 MED ORDER — INSULIN LISPRO (1 UNIT DIAL) 100 UNIT/ML (KWIKPEN)
PEN_INJECTOR | SUBCUTANEOUS | 9 refills | Status: DC
  Filled 2020-08-23: qty 15, 90d supply, fill #0
  Filled 2021-01-01: qty 15, 90d supply, fill #1
  Filled 2021-04-22: qty 15, 90d supply, fill #2

## 2020-08-30 ENCOUNTER — Other Ambulatory Visit: Payer: Self-pay

## 2020-08-30 MED FILL — Metformin HCl Tab ER 24HR 500 MG: ORAL | 30 days supply | Qty: 120 | Fill #2 | Status: AC

## 2020-09-02 ENCOUNTER — Other Ambulatory Visit (HOSPITAL_COMMUNITY): Payer: Self-pay

## 2020-09-05 ENCOUNTER — Other Ambulatory Visit: Payer: Self-pay | Admitting: Internal Medicine

## 2020-09-05 DIAGNOSIS — Z1231 Encounter for screening mammogram for malignant neoplasm of breast: Secondary | ICD-10-CM

## 2020-09-07 ENCOUNTER — Encounter: Payer: No Typology Code available for payment source | Admitting: Internal Medicine

## 2020-09-14 ENCOUNTER — Ambulatory Visit
Admission: RE | Admit: 2020-09-14 | Discharge: 2020-09-14 | Disposition: A | Discharge status: Home / Self Care | Payer: No Typology Code available for payment source | Source: Ambulatory Visit | Attending: Internal Medicine | Admitting: Internal Medicine

## 2020-09-14 ENCOUNTER — Other Ambulatory Visit: Payer: Self-pay

## 2020-09-14 DIAGNOSIS — Z1231 Encounter for screening mammogram for malignant neoplasm of breast: Secondary | ICD-10-CM | POA: Diagnosis present

## 2020-09-25 MED FILL — Metformin HCl Tab ER 24HR 500 MG: ORAL | 30 days supply | Qty: 120 | Fill #3 | Status: AC

## 2020-09-26 ENCOUNTER — Other Ambulatory Visit: Payer: Self-pay

## 2020-10-04 LAB — HEMOGLOBIN A1C: Hemoglobin A1C: 7.2

## 2020-10-05 MED FILL — Insulin Glargine-yfgn Soln Pen-Injector 100 Unit/ML: SUBCUTANEOUS | 46 days supply | Qty: 15 | Fill #1 | Status: AC

## 2020-10-06 ENCOUNTER — Other Ambulatory Visit: Payer: Self-pay

## 2020-10-12 LAB — BASIC METABOLIC PANEL
BUN: 11 (ref 4–21)
CO2: 29 — AB (ref 13–22)
Chloride: 104 (ref 99–108)
Creatinine: 0.7 (ref 0.5–1.1)
Glucose: 84
Potassium: 4.6 (ref 3.4–5.3)
Sodium: 140 (ref 137–147)

## 2020-10-17 ENCOUNTER — Other Ambulatory Visit: Payer: Self-pay

## 2020-10-17 MED FILL — Glipizide Tab ER 24HR 10 MG: ORAL | 90 days supply | Qty: 90 | Fill #1 | Status: AC

## 2020-10-19 ENCOUNTER — Other Ambulatory Visit: Payer: Self-pay

## 2020-10-19 MED ORDER — MOUNJARO 2.5 MG/0.5ML ~~LOC~~ SOAJ
SUBCUTANEOUS | 5 refills | Status: DC
  Filled 2020-10-19 – 2020-10-20 (×2): qty 2, 28d supply, fill #0
  Filled 2020-12-04: qty 2, 28d supply, fill #1
  Filled 2021-01-01: qty 2, 28d supply, fill #2

## 2020-10-20 ENCOUNTER — Other Ambulatory Visit: Payer: Self-pay

## 2020-11-01 ENCOUNTER — Other Ambulatory Visit: Payer: Self-pay

## 2020-11-01 MED FILL — Metformin HCl Tab ER 24HR 500 MG: ORAL | 30 days supply | Qty: 120 | Fill #4 | Status: AC

## 2020-11-09 ENCOUNTER — Other Ambulatory Visit: Payer: Self-pay

## 2020-11-09 ENCOUNTER — Encounter: Payer: Self-pay | Admitting: Internal Medicine

## 2020-11-09 ENCOUNTER — Ambulatory Visit (INDEPENDENT_AMBULATORY_CARE_PROVIDER_SITE_OTHER): Payer: No Typology Code available for payment source | Admitting: Internal Medicine

## 2020-11-09 VITALS — BP 128/66 | HR 85 | Temp 96.6°F | Ht 67.0 in | Wt 190.8 lb

## 2020-11-09 DIAGNOSIS — R809 Proteinuria, unspecified: Secondary | ICD-10-CM

## 2020-11-09 DIAGNOSIS — E663 Overweight: Secondary | ICD-10-CM

## 2020-11-09 DIAGNOSIS — Z0000 Encounter for general adult medical examination without abnormal findings: Secondary | ICD-10-CM

## 2020-11-09 DIAGNOSIS — E782 Mixed hyperlipidemia: Secondary | ICD-10-CM | POA: Diagnosis not present

## 2020-11-09 DIAGNOSIS — E611 Iron deficiency: Secondary | ICD-10-CM

## 2020-11-09 DIAGNOSIS — E1129 Type 2 diabetes mellitus with other diabetic kidney complication: Secondary | ICD-10-CM | POA: Diagnosis not present

## 2020-11-09 DIAGNOSIS — Z794 Long term (current) use of insulin: Secondary | ICD-10-CM

## 2020-11-09 DIAGNOSIS — D649 Anemia, unspecified: Secondary | ICD-10-CM

## 2020-11-09 NOTE — Patient Instructions (Addendum)
Rybelsus  is the oral form of Ozempic,  may be better tolerated

## 2020-11-09 NOTE — Progress Notes (Signed)
Patient ID: Melissa Werner, female    DOB: February 02, 1966  Age: 55 y.o. MRN: [id number]  The patient is here for annual preventive examination and management of other chronic and acute problems.   The risk factors are reflected in the social history.  The roster of all physicians providing medical care to patient - is listed in the Snapshot section of the chart.  Activities of daily living:  The patient is 100% independent in all ADLs: dressing, toileting, feeding as well as independent mobility  Home safety : The patient has smoke detectors in the home. They wear seatbelts.  There are no firearms at home. There is no violence in the home.   There is no risks for hepatitis, STDs or HIV. There is no   history of blood transfusion. They have no travel history to infectious disease endemic areas of the world.  The patient has seen their dentist in the last six month. They have seen their eye doctor in the last year. They admit to slight hearing difficulty with regard to whispered voices and some television programs.  They have deferred audiologic testing in the last year.  They do not  have excessive sun exposure. Discussed the need for sun protection: hats, long sleeves and use of sunscreen if there is significant sun exposure.   Diet: the importance of a healthy diet is discussed. They do have a healthy diet.  The benefits of regular aerobic exercise were discussed. She walks 4 times per week ,  20 minutes.   Depression screen: there are no signs or vegative symptoms of depression- irritability, change in appetite, anhedonia, sadness/tearfullness.  The following portions of the patient's history were reviewed and updated as appropriate: allergies, current medications, past family history, past medical history,  past surgical history, past social history  and problem list.  Visual acuity was not assessed per patient preference since she has regular follow up with her ophthalmologist. Hearing and  body mass index were assessed and reviewed.   During the course of the visit the patient was educated and counseled about appropriate screening and preventive services including : fall prevention , diabetes screening, nutrition counseling, colorectal cancer screening, and recommended immunizations.    CC: The primary encounter diagnosis was Anemia, unspecified type. Diagnoses of Controlled type 2 diabetes mellitus with microalbuminuria, with long-term current use of insulin (HCC), Mixed hyperlipidemia, Overweight (BMI 25.0-29.9), Dietary iron deficiency without anemia, and Encounter for preventive health examination were also pertinent to this visit.   Managed by Dr Tedd Sias at [facility].  Starting mounjaro   History Mayara has a past medical history of Anemia, Diabetes mellitus, GERD (gastroesophageal reflux disease), History of abnormal mammogram (2008), and Hyperlipidemia.   She has a past surgical history that includes Tonsillectomy; Wisdom tooth extraction; Colonoscopy with propofol (N/A, 02/03/2016); and Esophagogastroduodenoscopy (egd) with propofol (N/A, 02/03/2016).   Her family history includes Breast cancer (age of onset: 57) in her maternal aunt; Heart disease in her father; Hyperlipidemia in her father.She reports that she has never smoked. She has never used smokeless tobacco. She reports that she does not drink alcohol and does not use drugs.  Outpatient Medications Prior to Visit  Medication Sig Dispense Refill   ferrous sulfate 325 (65 FE) MG tablet Take 1 tablet (325 mg total) by mouth 2 (two) times daily with a meal. 60 tablet 0   glipiZIDE (GLUCOTROL XL) 10 MG 24 hr tablet TAKE 1 TABLET BY MOUTH ONCE DAILY WITH SUPPER 90 tablet 3  glucose blood test strip Use once daily to check cbg   DM type 2 100 each 1   insulin glargine-yfgn (SEMGLEE) 100 UNIT/ML Pen INJECT 32 UNITS SUBCUTANEOUSLY ONCE DAILY 15 mL 10   insulin lispro (HUMALOG) 100 UNIT/ML KwikPen INJECT 10 UNITS BEFORE  YOUR LUNCH MEAL. 15 mL 9   metFORMIN (GLUCOPHAGE-XR) 500 MG 24 hr tablet TAKE 4 TABLETS (2,000 MG) BY MOUTH DAILY WITH DINNER 120 tablet 8   omeprazole (PRILOSEC) 20 MG capsule Take 20 mg by mouth daily.     ONE TOUCH ULTRA TEST test strip USE 3 TIMES DAILY TO CHECK BLOOD SUGAR. 100 each 2   UNIFINE PENTIPS 31G X 5 MM MISC      liraglutide (VICTOZA) 18 MG/3ML SOPN INJECT 1.8 MG UNDER THE SKIN DAILY 27 mL 3   VICTOZA 18 MG/3ML SOPN Inject 1.8 mg into the skin daily.     glipiZIDE (GLUCOTROL) 10 MG tablet Take 10 mg by mouth daily before breakfast.  (Patient not taking: Reported on 11/09/2020)     tirzepatide [facility]) 2.5 MG/0.5ML Pen Inject 2.5 mg subcutaneously every 7 (seven) days (Patient not taking: Reported on 11/09/2020) 2 mL 5   COVID-19 mRNA vaccine, Pfizer, 30 MCG/0.3ML injection USE AS DIRECTED .3 mL 0   HUMALOG KWIKPEN 100 UNIT/ML KwikPen      Insulin Glargine (BASAGLAR KWIKPEN) 100 UNIT/ML INJECT 32 UNITS UNDER THE SKIN DAILY 45 mL 3   insulin glargine (LANTUS) 100 UNIT/ML Solostar Pen Take 20 units subcutaneously each night (Patient not taking: Reported on 11/09/2020)     insulin lispro (HUMALOG) 100 UNIT/ML KwikPen INJECT 10 UNITS BEFORE YOUR LUNCH MEAL. 15 mL 12   liraglutide (VICTOZA) 18 MG/3ML SOPN INJECT 1.8 MG UNDER THE SKIN DAILY 27 mL 3   metformin (FORTAMET) 1000 MG (OSM) 24 hr tablet Take 1,000 mg by mouth 2 (two) times daily with a meal.      metFORMIN (GLUCOPHAGE-XR) 500 MG 24 hr tablet Take 1,000 mg by mouth 2 (two) times daily. (Patient not taking: Reported on 11/09/2020)     metFORMIN (GLUCOPHAGE-XR) 500 MG 24 hr tablet TAKE 4 TABLETS (2,000 MG) BY MOUTH DAILY WITH DINNER 120 tablet 8   Semaglutide, 1 MG/DOSE, 2 MG/1.5ML SOPN Inject into the skin. (Patient not taking: Reported on 11/09/2020)     No facility-administered medications prior to visit.    Review of Systems  Objective:  BP 128/66 (BP Location: Left Arm, Patient Position: Sitting, Cuff Size: Normal)    Pulse 85   Temp (!) 96.6 F (35.9 C) (Temporal)   Ht 5\' 7"  (1.702 m)   Wt 190 lb 12.8 oz (86.5 kg)   SpO2 98%   BMI 29.88 kg/m   Physical Exam  Physical Exam   Assessment & Plan:   Problem List Items Addressed This Visit       Unprioritized   Hyperlipidemia    Untreated per patient preference to avoid statin  Lab Results  Component Value Date   CHOL 202 (A) 05/12/2020   HDL 54 05/12/2020   LDLCALC 107 05/12/2020   LDLDIRECT 100.0 08/11/2018   TRIG 210 (A) 05/12/2020   CHOLHDL 4 08/11/2018         Controlled type 2 diabetes mellitus with microalbuminuria, with long-term current use of insulin (HCC)    Her diabetes is managed by Dr 08/13/2018.  Last note reviewed.  Using Lantus, Humalog, metformin and  glipizide.  Medication change Is planned from Victoza to [facility]; with previous intolerances to  ozempic and Trulicity.; however she is not taking a statin or an ARB.  She has not had urine tested sine Sept 2021 but had microalbuminuria which has not been treated .  Will discuss with patient       Relevant Orders   Microalbumin / creatinine urine ratio   Overweight (BMI 25.0-29.9)    Previous intolerance to ozempic noted.  Mounjaro trial planned per review of Dr solum's notes.      Dietary iron deficiency without anemia    Resolved by last check. She has been feeling more fatigued and short of breath, so levels were rechecked and normal.   Lab Results  Component Value Date   IRON 63 11/09/2020   TIBC 378.0 11/09/2020   FERRITIN 53.7 11/09/2020   Lab Results  Component Value Date   WBC 10.0 11/09/2020   HGB 13.7 11/09/2020   HCT 41.9 11/09/2020   MCV 82.6 11/09/2020   PLT 257.0 11/09/2020         Encounter for preventive health examination    age appropriate education and counseling updated, referrals for preventative services and immunizations addressed, dietary and smoking counseling addressed, most recent labs reviewed.  I have personally reviewed and have  noted:   1) the patient's medical and social history 2) The pt's use of alcohol, tobacco, and illicit drugs 3) The patient's current medications and supplements 4) Functional ability including ADL's, fall risk, home safety risk, hearing and visual impairment 5) Diet and physical activities 6) Evidence for depression or mood disorder 7) The patient's height, weight, and BMI have been recorded in the chart   I have made referrals, and provided counseling and education based on review of the above      Other Visit Diagnoses     Anemia, unspecified type    -  Primary   Relevant Orders   IBC + Ferritin (Completed)   Vitamin B12 (Completed)   CBC with Differential/Platelet (Completed)       I have discontinued Katera L. Fraley's insulin glargine, Semaglutide (1 MG/DOSE), Victoza, HumaLOG KwikPen, COVID-19 mRNA vaccine Proofreader), liraglutide, Basaglar KwikPen, and Victoza. I am also having her maintain her glucose blood, omeprazole, ONE TOUCH ULTRA TEST, glipiZIDE, ferrous sulfate, Unifine Pentips, insulin glargine-yfgn, metFORMIN, glipiZIDE, insulin lispro, and Mounjaro.  No orders of the defined types were placed in this encounter.   Medications Discontinued During This Encounter  Medication Reason   HUMALOG KWIKPEN 100 UNIT/ML KwikPen    COVID-19 mRNA vaccine, Pfizer, 30 MCG/0.3ML injection    Insulin Glargine (BASAGLAR KWIKPEN) 100 UNIT/ML    insulin glargine (LANTUS) 100 UNIT/ML Solostar Pen    insulin lispro (HUMALOG) 100 UNIT/ML KwikPen    metformin (FORTAMET) 1000 MG (OSM) 24 hr tablet    liraglutide (VICTOZA) 18 MG/3ML SOPN    metFORMIN (GLUCOPHAGE-XR) 500 MG 24 hr tablet    metFORMIN (GLUCOPHAGE-XR) 500 MG 24 hr tablet    Semaglutide, 1 MG/DOSE, 2 MG/1.5ML SOPN    VICTOZA 18 MG/3ML SOPN    liraglutide (VICTOZA) 18 MG/3ML SOPN     Follow-up: Return in about 1 year (around 11/09/2021).   Sherlene Shams, MD

## 2020-11-10 LAB — CBC WITH DIFFERENTIAL/PLATELET
Basophils Absolute: 0.1 10*3/uL (ref 0.0–0.1)
Basophils Relative: 1.2 % (ref 0.0–3.0)
Eosinophils Absolute: 0.2 10*3/uL (ref 0.0–0.7)
Eosinophils Relative: 2.2 % (ref 0.0–5.0)
HCT: 41.9 % (ref 36.0–46.0)
Hemoglobin: 13.7 g/dL (ref 12.0–15.0)
Lymphocytes Relative: 40.4 % (ref 12.0–46.0)
Lymphs Abs: 4 10*3/uL (ref 0.7–4.0)
MCHC: 32.6 g/dL (ref 30.0–36.0)
MCV: 82.6 fl (ref 78.0–100.0)
Monocytes Absolute: 0.6 10*3/uL (ref 0.1–1.0)
Monocytes Relative: 5.9 % (ref 3.0–12.0)
Neutro Abs: 5 10*3/uL (ref 1.4–7.7)
Neutrophils Relative %: 50.3 % (ref 43.0–77.0)
Platelets: 257 10*3/uL (ref 150.0–400.0)
RBC: 5.07 Mil/uL (ref 3.87–5.11)
RDW: 14.2 % (ref 11.5–15.5)
WBC: 10 10*3/uL (ref 4.0–10.5)

## 2020-11-10 LAB — VITAMIN B12: Vitamin B-12: 258 pg/mL (ref 211–911)

## 2020-11-10 LAB — IBC + FERRITIN
Ferritin: 53.7 ng/mL (ref 10.0–291.0)
Iron: 63 ug/dL (ref 42–145)
Saturation Ratios: 16.7 % — ABNORMAL LOW (ref 20.0–50.0)
TIBC: 378 ug/dL (ref 250.0–450.0)
Transferrin: 270 mg/dL (ref 212.0–360.0)

## 2020-11-12 NOTE — Assessment & Plan Note (Signed)
Untreated per patient preference to avoid statin  Lab Results  Component Value Date   CHOL 202 (A) 05/12/2020   HDL 54 05/12/2020   LDLCALC 107 05/12/2020   LDLDIRECT 100.0 08/11/2018   TRIG 210 (A) 05/12/2020   CHOLHDL 4 08/11/2018

## 2020-11-12 NOTE — Assessment & Plan Note (Signed)
Resolved by last check. She has been feeling more fatigued and short of breath, so levels were rechecked and normal.   Lab Results  Component Value Date   IRON 63 11/09/2020   TIBC 378.0 11/09/2020   FERRITIN 53.7 11/09/2020   Lab Results  Component Value Date   WBC 10.0 11/09/2020   HGB 13.7 11/09/2020   HCT 41.9 11/09/2020   MCV 82.6 11/09/2020   PLT 257.0 11/09/2020

## 2020-11-12 NOTE — Assessment & Plan Note (Addendum)
Her diabetes is managed by Dr Tedd Sias.  Last note reviewed.  Using Lantus, Humalog, metformin and  glipizide.  Medication change Is planned from Victoza to [facility]; with previous intolerances to ozempic and Trulicity.; however she is not taking a statin or an ARB.  She has not had urine tested sine Sept 2021 but had microalbuminuria which has not been treated .  Will discuss with patient

## 2020-11-12 NOTE — Assessment & Plan Note (Signed)

## 2020-11-12 NOTE — Assessment & Plan Note (Signed)
Previous intolerance to ozempic noted.  Mounjaro trial planned per review of Dr solum's notes.

## 2020-11-20 MED FILL — Insulin Glargine-yfgn Soln Pen-Injector 100 Unit/ML: SUBCUTANEOUS | 46 days supply | Qty: 15 | Fill #2 | Status: CN

## 2020-11-21 ENCOUNTER — Other Ambulatory Visit: Payer: Self-pay

## 2020-11-21 MED FILL — Insulin Glargine-yfgn Soln Pen-Injector 100 Unit/ML: SUBCUTANEOUS | 46 days supply | Qty: 15 | Fill #2 | Status: AC

## 2020-12-04 MED FILL — Metformin HCl Tab ER 24HR 500 MG: ORAL | 30 days supply | Qty: 120 | Fill #5 | Status: AC

## 2020-12-05 ENCOUNTER — Other Ambulatory Visit: Payer: Self-pay

## 2021-01-01 MED FILL — Metformin HCl Tab ER 24HR 500 MG: ORAL | 30 days supply | Qty: 120 | Fill #6 | Status: AC

## 2021-01-02 ENCOUNTER — Other Ambulatory Visit: Payer: Self-pay

## 2021-01-13 ENCOUNTER — Other Ambulatory Visit: Payer: Self-pay

## 2021-01-13 MED FILL — Insulin Glargine-yfgn Soln Pen-Injector 100 Unit/ML: SUBCUTANEOUS | 46 days supply | Qty: 15 | Fill #3 | Status: AC

## 2021-01-16 ENCOUNTER — Other Ambulatory Visit: Payer: Self-pay

## 2021-01-16 MED ORDER — GLIPIZIDE ER 10 MG PO TB24
10.0000 mg | ORAL_TABLET | Freq: Every day | ORAL | 2 refills | Status: DC
  Filled 2021-01-16: qty 90, 90d supply, fill #0
  Filled 2021-04-16: qty 90, 90d supply, fill #1

## 2021-01-25 ENCOUNTER — Other Ambulatory Visit: Payer: Self-pay

## 2021-01-25 MED ORDER — MOUNJARO 5 MG/0.5ML ~~LOC~~ SOAJ
SUBCUTANEOUS | 3 refills | Status: DC
  Filled 2021-01-25: qty 2, 28d supply, fill #0
  Filled 2021-02-28: qty 2, 28d supply, fill #1
  Filled 2021-04-02: qty 2, 28d supply, fill #2

## 2021-01-26 ENCOUNTER — Other Ambulatory Visit: Payer: Self-pay

## 2021-02-04 ENCOUNTER — Other Ambulatory Visit: Payer: Self-pay

## 2021-02-06 ENCOUNTER — Other Ambulatory Visit: Payer: Self-pay

## 2021-02-06 MED ORDER — METFORMIN HCL ER 500 MG PO TB24
ORAL_TABLET | ORAL | 8 refills | Status: DC
  Filled 2021-02-06: qty 120, 30d supply, fill #0
  Filled 2021-03-05: qty 120, 30d supply, fill #1
  Filled 2021-04-02: qty 120, 30d supply, fill #2
  Filled 2021-05-05: qty 120, 30d supply, fill #3
  Filled 2021-06-04: qty 120, 30d supply, fill #4
  Filled 2021-07-03: qty 120, 30d supply, fill #5
  Filled 2021-08-06: qty 120, 30d supply, fill #6
  Filled 2021-09-03: qty 120, 30d supply, fill #7
  Filled 2021-10-08: qty 120, 30d supply, fill #8

## 2021-02-28 MED FILL — Insulin Glargine-yfgn Soln Pen-Injector 100 Unit/ML: SUBCUTANEOUS | 46 days supply | Qty: 15 | Fill #4 | Status: AC

## 2021-03-01 ENCOUNTER — Other Ambulatory Visit: Payer: Self-pay

## 2021-03-06 ENCOUNTER — Other Ambulatory Visit: Payer: Self-pay

## 2021-04-03 ENCOUNTER — Other Ambulatory Visit: Payer: Self-pay

## 2021-04-17 ENCOUNTER — Other Ambulatory Visit: Payer: Self-pay

## 2021-04-24 ENCOUNTER — Other Ambulatory Visit: Payer: Self-pay

## 2021-05-01 LAB — PROTEIN / CREATININE RATIO, URINE
Albumin, U: 7
Creatinine, Urine: 167.1

## 2021-05-01 LAB — MICROALBUMIN / CREATININE URINE RATIO: Microalb Creat Ratio: 4.2

## 2021-05-05 ENCOUNTER — Other Ambulatory Visit: Payer: Self-pay

## 2021-05-08 ENCOUNTER — Other Ambulatory Visit: Payer: Self-pay

## 2021-05-08 MED ORDER — FREESTYLE LITE TEST VI STRP
ORAL_STRIP | 3 refills | Status: DC
  Filled 2021-05-08: qty 300, 90d supply, fill #0
  Filled 2021-07-28: qty 300, 90d supply, fill #1
  Filled 2022-02-14: qty 300, 90d supply, fill #2

## 2021-05-08 MED ORDER — MOUNJARO 7.5 MG/0.5ML ~~LOC~~ SOAJ
SUBCUTANEOUS | 3 refills | Status: DC
  Filled 2021-05-08: qty 2, 28d supply, fill #0
  Filled 2021-05-08: qty 6, 84d supply, fill #0
  Filled 2021-07-29: qty 2, 28d supply, fill #1
  Filled 2021-08-22: qty 2, 28d supply, fill #2

## 2021-05-09 ENCOUNTER — Other Ambulatory Visit: Payer: Self-pay

## 2021-06-05 ENCOUNTER — Other Ambulatory Visit: Payer: Self-pay

## 2021-06-25 ENCOUNTER — Other Ambulatory Visit: Payer: Self-pay

## 2021-06-27 ENCOUNTER — Other Ambulatory Visit: Payer: Self-pay

## 2021-06-28 ENCOUNTER — Other Ambulatory Visit: Payer: Self-pay

## 2021-06-28 MED ORDER — SEMGLEE (YFGN) 100 UNIT/ML ~~LOC~~ SOPN
PEN_INJECTOR | SUBCUTANEOUS | 5 refills | Status: DC
  Filled 2021-06-28: qty 15, 30d supply, fill #0
  Filled 2021-09-03: qty 15, 30d supply, fill #1
  Filled 2021-11-01: qty 15, 30d supply, fill #2
  Filled 2021-12-10: qty 15, 30d supply, fill #3
  Filled 2022-02-05: qty 15, 30d supply, fill #4

## 2021-07-04 ENCOUNTER — Other Ambulatory Visit: Payer: Self-pay

## 2021-07-28 ENCOUNTER — Other Ambulatory Visit: Payer: Self-pay

## 2021-07-30 ENCOUNTER — Other Ambulatory Visit: Payer: Self-pay

## 2021-07-31 ENCOUNTER — Other Ambulatory Visit: Payer: Self-pay

## 2021-08-01 ENCOUNTER — Other Ambulatory Visit: Payer: Self-pay | Admitting: Internal Medicine

## 2021-08-01 DIAGNOSIS — Z1231 Encounter for screening mammogram for malignant neoplasm of breast: Secondary | ICD-10-CM

## 2021-08-01 LAB — HM DIABETES EYE EXAM

## 2021-08-06 ENCOUNTER — Other Ambulatory Visit: Payer: Self-pay

## 2021-08-07 ENCOUNTER — Other Ambulatory Visit: Payer: Self-pay

## 2021-08-22 ENCOUNTER — Other Ambulatory Visit: Payer: Self-pay

## 2021-09-03 ENCOUNTER — Other Ambulatory Visit: Payer: Self-pay

## 2021-09-04 ENCOUNTER — Other Ambulatory Visit: Payer: Self-pay

## 2021-09-04 MED ORDER — INSULIN LISPRO (1 UNIT DIAL) 100 UNIT/ML (KWIKPEN)
PEN_INJECTOR | SUBCUTANEOUS | 11 refills | Status: DC
  Filled 2021-09-04: qty 15, 90d supply, fill #0

## 2021-09-11 LAB — HEMOGLOBIN A1C: Hemoglobin A1C: 6.8

## 2021-09-18 ENCOUNTER — Other Ambulatory Visit: Payer: Self-pay

## 2021-09-18 MED ORDER — INSULIN LISPRO (1 UNIT DIAL) 100 UNIT/ML (KWIKPEN)
PEN_INJECTOR | SUBCUTANEOUS | 11 refills | Status: DC
  Filled 2021-09-18: qty 15, 30d supply, fill #0

## 2021-09-18 MED ORDER — MOUNJARO 7.5 MG/0.5ML ~~LOC~~ SOAJ
SUBCUTANEOUS | 3 refills | Status: DC
  Filled 2021-09-18: qty 2, 28d supply, fill #0
  Filled 2021-10-16: qty 2, 28d supply, fill #1
  Filled 2021-11-13: qty 2, 28d supply, fill #2
  Filled 2021-12-10: qty 2, 28d supply, fill #3
  Filled 2022-01-09: qty 2, 28d supply, fill #4
  Filled 2022-02-05: qty 2, 28d supply, fill #5
  Filled 2022-03-07: qty 2, 28d supply, fill #6
  Filled 2022-04-02: qty 2, 28d supply, fill #7
  Filled 2022-05-06: qty 2, 28d supply, fill #8
  Filled 2022-06-07: qty 2, 28d supply, fill #9
  Filled 2022-07-01: qty 2, 28d supply, fill #10
  Filled 2022-08-05: qty 2, 28d supply, fill #11

## 2021-09-18 MED ORDER — METFORMIN HCL ER 500 MG PO TB24
ORAL_TABLET | ORAL | 3 refills | Status: DC
  Filled 2021-09-18: qty 360, 90d supply, fill #0

## 2021-10-05 ENCOUNTER — Ambulatory Visit
Admission: RE | Admit: 2021-10-05 | Discharge: 2021-10-05 | Disposition: A | Discharge status: Home / Self Care | Payer: No Typology Code available for payment source | Source: Ambulatory Visit | Attending: Internal Medicine | Admitting: Internal Medicine

## 2021-10-05 DIAGNOSIS — Z1231 Encounter for screening mammogram for malignant neoplasm of breast: Secondary | ICD-10-CM | POA: Diagnosis not present

## 2021-10-08 ENCOUNTER — Other Ambulatory Visit: Payer: Self-pay

## 2021-10-09 ENCOUNTER — Other Ambulatory Visit: Payer: Self-pay

## 2021-10-16 ENCOUNTER — Other Ambulatory Visit: Payer: Self-pay

## 2021-11-01 ENCOUNTER — Other Ambulatory Visit: Payer: Self-pay

## 2021-11-02 ENCOUNTER — Other Ambulatory Visit: Payer: Self-pay

## 2021-11-02 MED ORDER — METFORMIN HCL ER 500 MG PO TB24
ORAL_TABLET | ORAL | 3 refills | Status: DC
Start: 2021-11-02 — End: 2022-12-26
  Filled 2021-11-02: qty 360, 90d supply, fill #0
  Filled 2022-02-05: qty 360, 90d supply, fill #1
  Filled 2022-05-06: qty 360, 90d supply, fill #2
  Filled 2022-08-05: qty 360, 90d supply, fill #3

## 2021-11-13 ENCOUNTER — Ambulatory Visit (INDEPENDENT_AMBULATORY_CARE_PROVIDER_SITE_OTHER): Payer: No Typology Code available for payment source | Admitting: Internal Medicine

## 2021-11-13 ENCOUNTER — Other Ambulatory Visit (HOSPITAL_COMMUNITY)
Admission: RE | Admit: 2021-11-13 | Discharge: 2021-11-13 | Disposition: A | Discharge status: Home / Self Care | Payer: No Typology Code available for payment source | Source: Ambulatory Visit | Attending: Internal Medicine | Admitting: Internal Medicine

## 2021-11-13 ENCOUNTER — Other Ambulatory Visit: Payer: Self-pay

## 2021-11-13 ENCOUNTER — Encounter: Payer: Self-pay | Admitting: Internal Medicine

## 2021-11-13 DIAGNOSIS — Z0000 Encounter for general adult medical examination without abnormal findings: Secondary | ICD-10-CM

## 2021-11-13 DIAGNOSIS — Z124 Encounter for screening for malignant neoplasm of cervix: Secondary | ICD-10-CM | POA: Diagnosis present

## 2021-11-13 DIAGNOSIS — Z79899 Other long term (current) drug therapy: Secondary | ICD-10-CM

## 2021-11-13 DIAGNOSIS — E611 Iron deficiency: Secondary | ICD-10-CM

## 2021-11-13 DIAGNOSIS — E782 Mixed hyperlipidemia: Secondary | ICD-10-CM

## 2021-11-13 DIAGNOSIS — R809 Proteinuria, unspecified: Secondary | ICD-10-CM

## 2021-11-13 DIAGNOSIS — Z114 Encounter for screening for human immunodeficiency virus [HIV]: Secondary | ICD-10-CM

## 2021-11-13 DIAGNOSIS — E1129 Type 2 diabetes mellitus with other diabetic kidney complication: Secondary | ICD-10-CM

## 2021-11-13 DIAGNOSIS — Z1159 Encounter for screening for other viral diseases: Secondary | ICD-10-CM

## 2021-11-13 DIAGNOSIS — Z794 Long term (current) use of insulin: Secondary | ICD-10-CM

## 2021-11-13 NOTE — Assessment & Plan Note (Signed)
Managed by Dr Fredna Dow,  .  Foot exam done.

## 2021-11-13 NOTE — Assessment & Plan Note (Signed)
Resolved by last check. Etiology was unclear (EGD and colonoscopy were done but hematology referral was deferrd by patient).  She has been taking iron since last year  .  Advised to stop iron and return in 6 weeks

## 2021-11-13 NOTE — Assessment & Plan Note (Signed)
She has declined statin therapy recommended for all adults with type 2 diabetes

## 2021-11-13 NOTE — Progress Notes (Signed)
The patient is here for annual preventive examination and management of other chronic and acute problems.   The risk factors are reflected in the social history.   The roster of all physicians providing medical care to patient - is listed in the Snapshot section of the chart.   Activities of daily living:  The patient is 100% independent in all ADLs: dressing, toileting, feeding as well as independent mobility   Home safety : The patient has smoke detectors in the home. They wear seatbelts.  There are no unsecured firearms at home. There is no violence in the home.    There is no risks for hepatitis, STDs or HIV. There is no   history of blood transfusion. They have no travel history to infectious disease endemic areas of the world.   The patient has seen their dentist in the last six month. They have seen their eye doctor in the last year. The patinet  denies slight hearing difficulty with regard to whispered voices and some television programs.  They have deferred audiologic testing in the last year.  They do not  have excessive sun exposure. Discussed the need for sun protection: hats, long sleeves and use of sunscreen if there is significant sun exposure.    Diet: the importance of a healthy diet is discussed. They do have a healthy diet.   The benefits of regular aerobic exercise were discussed. The patient  participates in yoga 3 days per week for 60 minutes.    Depression screen: there are no signs or vegative symptoms of depression- irritability, change in appetite, anhedonia, sadness/tearfullness.   The following portions of the patient's history were reviewed and updated as appropriate: allergies, current medications, past family history, past medical history,  past surgical history, past social history  and problem list.   Visual acuity was not assessed per patient preference since the patient has regular follow up with an  ophthalmologist. Hearing and body mass index were assessed  and reviewed.    During the course of the visit the patient was educated and counseled about appropriate screening and preventive services including : fall prevention , diabetes screening, nutrition counseling, colorectal cancer screening, and recommended immunizations.    Chief Complaint:  none  Review of Symptoms  Patient denies headache, fevers, malaise, unintentional weight loss, skin rash, eye pain, sinus congestion and sinus pain, sore throat, dysphagia,  hemoptysis , cough, dyspnea, wheezing, chest pain, palpitations, orthopnea, edema, abdominal pain, nausea, melena, diarrhea, constipation, flank pain, dysuria, hematuria, urinary  Frequency, nocturia, numbness, tingling, seizures,  Focal weakness, Loss of consciousness,  Tremor, insomnia, depression, anxiety, and suicidal ideation.    Physical Exam:  LMP 09/09/2017    General Appearance:    Alert, cooperative, no distress, appears stated age  Head:    Normocephalic, without obvious abnormality, atraumatic  Eyes:    PERRL, conjunctiva/corneas clear, EOM's intact, fundi    benign, both eyes  Ears:    Normal TM's and external ear canals, both ears  Nose:   Nares normal, septum midline, mucosa normal, no drainage    or sinus tenderness  Throat:   Lips, mucosa, and tongue normal; teeth and gums normal  Neck:   Supple, symmetrical, trachea midline, no adenopathy;    thyroid:  no enlargement/tenderness/nodules; no carotid   bruit or JVD  Back:     Symmetric, no curvature, ROM normal, no CVA tenderness  Lungs:     Clear to auscultation bilaterally, respirations unlabored  Chest Wall:  No tenderness or deformity   Heart:    Regular rate and rhythm, S1 and S2 normal, no murmur, rub   or gallop  Breast Exam:    No tenderness, masses, or nipple abnormality  Abdomen:     Soft, non-tender, bowel sounds active all four quadrants,    no masses, no organomegaly  Genitalia:    Pelvic: cervix normal in appearance, external genitalia normal,  no adnexal masses or tenderness, no cervical motion tenderness, rectovaginal septum normal, uterus normal size, shape, and consistency and vagina normal without discharge  Extremities:   Extremities normal, atraumatic, no cyanosis or edema  Pulses:   2+ and symmetric all extremities  Skin:   Skin color, texture, turgor normal, no rashes or lesions  Lymph nodes:   Cervical, supraclavicular, and axillary nodes normal  Neurologic:   CNII-XII intact, normal strength, sensation and reflexes    throughout      Assessment and Plan:  Encounter for preventive health examination age appropriate education and counseling updated, referrals for preventative services and immunizations addressed, dietary and smoking counseling addressed, most recent labs reviewed.  I have personally reviewed and have noted:   1) the patient's medical and social history 2) The pt's use of alcohol, tobacco, and illicit drugs 3) The patient's current medications and supplements 4) Functional ability including ADL's, fall risk, home safety risk, hearing and visual impairment 5) Diet and physical activities 6) Evidence for depression or mood disorder 7) The patient's height, weight, and BMI have been recorded in the chart    I have made referrals, and provided counseling and education based on review of the above  Hyperlipidemia She has declined statin therapy recommended for all adults with type 2 diabetes   Controlled type 2 diabetes mellitus with microalbuminuria, with long-term current use of insulin (HCC) Managed by Dr Andrey Spearman,  .  Foot exam done.   Dietary iron deficiency without anemia Resolved by last check. Etiology was unclear (EGD and colonoscopy were done but hematology referral was deferrd by patient).  She has been taking iron since last year  .  Advised to stop iron and return in 6 weeks    Updated Medication List Outpatient Encounter Medications as of 11/13/2021  Medication Sig   ferrous sulfate 325 (65  FE) MG tablet Take 1 tablet (325 mg total) by mouth 2 (two) times daily with a meal.   glucose blood (FREESTYLE LITE) test strip Test 3 times a day. Use as instructed.   glucose blood test strip Use once daily to check cbg   DM type 2   insulin lispro (HUMALOG) 100 UNIT/ML KwikPen INJECT 15 units at lunch and dinner   metFORMIN (GLUCOPHAGE-XR) 500 MG 24 hr tablet TAKE 4 TABLETS (2,000 MG) BY MOUTH DAILY WITH DINNER   omeprazole (PRILOSEC) 20 MG capsule Take 20 mg by mouth daily.   ONE TOUCH ULTRA TEST test strip USE 3 TIMES DAILY TO CHECK BLOOD SUGAR.   SEMGLEE, YFGN, 100 UNIT/ML Pen Inject 32 Units subcutaneously once daily   tirzepatide (MOUNJARO) 7.5 MG/0.5ML Pen Inject 0.5 mLs (7.5 mg total) subcutaneously every 7 (seven) days   UNIFINE PENTIPS 31G X 5 MM MISC    [DISCONTINUED] glipiZIDE (GLUCOTROL XL) 10 MG 24 hr tablet TAKE 1 TABLET BY MOUTH ONCE DAILY WITH SUPPER   [DISCONTINUED] glipiZIDE (GLUCOTROL XL) 10 MG 24 hr tablet TAKE 1 TABLET BY MOUTH ONCE DAILY WITH SUPPER   [DISCONTINUED] glipiZIDE (GLUCOTROL) 10 MG tablet Take 10 mg by mouth daily  before breakfast.  (Patient not taking: Reported on 11/09/2020)   [DISCONTINUED] insulin lispro (HUMALOG KWIKPEN) 100 UNIT/ML KwikPen INJECT 10 UNITS BEFORE YOUR LUNCH MEAL. (Patient not taking: Reported on 11/13/2021)   [DISCONTINUED] insulin lispro (HUMALOG) 100 UNIT/ML KwikPen INJECT 10 UNITS BEFORE YOUR LUNCH MEAL. (Patient not taking: Reported on 11/13/2021)   [DISCONTINUED] metFORMIN (GLUCOPHAGE-XR) 500 MG 24 hr tablet Take 4 tablets (2,000 mg total) by mouth daily with dinner (Patient not taking: Reported on 11/13/2021)   [DISCONTINUED] tirzepatide [facility]) 2.5 MG/0.5ML Pen Inject 2.5 mg subcutaneously every 7 (seven) days (Patient not taking: Reported on 11/09/2020)   [DISCONTINUED] tirzepatide (MOUNJARO) 5 MG/0.5ML Pen Inject 0.5 mLs (5 mg total) subcutaneously every 7 (seven) days (Patient not taking: Reported on 11/13/2021)   [DISCONTINUED]  tirzepatide (MOUNJARO) 7.5 MG/0.5ML Pen Inject 0.5 mLs (7.5 mg total) subcutaneously every 7 (seven) days (Patient not taking: Reported on 11/13/2021)   No facility-administered encounter medications on file as of 11/13/2021.

## 2021-11-13 NOTE — Patient Instructions (Addendum)
Return for fasting labs in 6 weeks   No more  iron

## 2021-11-13 NOTE — Assessment & Plan Note (Signed)

## 2021-11-15 LAB — CYTOLOGY - PAP
Comment: NEGATIVE
Diagnosis: NEGATIVE
High risk HPV: NEGATIVE

## 2021-12-11 ENCOUNTER — Other Ambulatory Visit: Payer: Self-pay

## 2021-12-11 MED ORDER — INSULIN LISPRO (1 UNIT DIAL) 100 UNIT/ML (KWIKPEN)
PEN_INJECTOR | SUBCUTANEOUS | 4 refills | Status: DC
  Filled 2021-12-11: qty 15, 90d supply, fill #0
  Filled 2022-03-07: qty 15, 90d supply, fill #1
  Filled 2022-05-27: qty 15, 90d supply, fill #2
  Filled 2022-08-05: qty 15, 90d supply, fill #3
  Filled 2022-10-30: qty 15, 90d supply, fill #4

## 2021-12-12 ENCOUNTER — Encounter: Payer: Self-pay | Admitting: Internal Medicine

## 2021-12-14 ENCOUNTER — Other Ambulatory Visit: Payer: Self-pay

## 2021-12-14 DIAGNOSIS — Z794 Long term (current) use of insulin: Secondary | ICD-10-CM

## 2021-12-25 ENCOUNTER — Other Ambulatory Visit (INDEPENDENT_AMBULATORY_CARE_PROVIDER_SITE_OTHER): Payer: No Typology Code available for payment source

## 2021-12-25 DIAGNOSIS — Z114 Encounter for screening for human immunodeficiency virus [HIV]: Secondary | ICD-10-CM

## 2021-12-25 DIAGNOSIS — Z79899 Other long term (current) drug therapy: Secondary | ICD-10-CM

## 2021-12-25 DIAGNOSIS — Z1159 Encounter for screening for other viral diseases: Secondary | ICD-10-CM

## 2021-12-25 DIAGNOSIS — E611 Iron deficiency: Secondary | ICD-10-CM | POA: Diagnosis not present

## 2021-12-25 DIAGNOSIS — E782 Mixed hyperlipidemia: Secondary | ICD-10-CM | POA: Diagnosis not present

## 2021-12-25 LAB — LIPID PANEL
Cholesterol: 190 mg/dL (ref 0–200)
HDL: 47.8 mg/dL (ref >39.00)
NonHDL: 142.1
Total CHOL/HDL Ratio: 4
Triglycerides: 216 mg/dL — ABNORMAL HIGH (ref 0.0–149.0)
VLDL: 43.2 mg/dL — ABNORMAL HIGH (ref 0.0–40.0)

## 2021-12-25 LAB — IBC + FERRITIN
Ferritin: 41.7 ng/mL (ref 10.0–291.0)
Iron: 119 ug/dL (ref 42–145)
Saturation Ratios: 39.5 % (ref 20.0–50.0)
TIBC: 301 ug/dL (ref 250.0–450.0)
Transferrin: 215 mg/dL (ref 212.0–360.0)

## 2021-12-25 LAB — COMPREHENSIVE METABOLIC PANEL
ALT: 12 U/L (ref 0–35)
AST: 12 U/L (ref 0–37)
Albumin: 4.2 g/dL (ref 3.5–5.2)
Alkaline Phosphatase: 58 U/L (ref 39–117)
BUN: 13 mg/dL (ref 6–23)
CO2: 26 mEq/L (ref 19–32)
Calcium: 8.9 mg/dL (ref 8.4–10.5)
Chloride: 106 mEq/L (ref 96–112)
Creatinine, Ser: 0.62 mg/dL (ref 0.40–1.20)
GFR: 99.99 mL/min (ref >60.00)
Glucose, Bld: 101 mg/dL — ABNORMAL HIGH (ref 70–99)
Potassium: 4.2 mEq/L (ref 3.5–5.1)
Sodium: 141 mEq/L (ref 135–145)
Total Bilirubin: 0.5 mg/dL (ref 0.2–1.2)
Total Protein: 6.2 g/dL (ref 6.0–8.3)

## 2021-12-25 LAB — CBC WITH DIFFERENTIAL/PLATELET
Basophils Absolute: 0.1 10*3/uL (ref 0.0–0.1)
Basophils Relative: 1.1 % (ref 0.0–3.0)
Eosinophils Absolute: 0.2 10*3/uL (ref 0.0–0.7)
Eosinophils Relative: 2.3 % (ref 0.0–5.0)
HCT: 40.7 % (ref 36.0–46.0)
Hemoglobin: 13.5 g/dL (ref 12.0–15.0)
Lymphocytes Relative: 39 % (ref 12.0–46.0)
Lymphs Abs: 2.9 10*3/uL (ref 0.7–4.0)
MCHC: 33.1 g/dL (ref 30.0–36.0)
MCV: 82.6 fl (ref 78.0–100.0)
Monocytes Absolute: 0.5 10*3/uL (ref 0.1–1.0)
Monocytes Relative: 6.5 % (ref 3.0–12.0)
Neutro Abs: 3.8 10*3/uL (ref 1.4–7.7)
Neutrophils Relative %: 51.1 % (ref 43.0–77.0)
Platelets: 231 10*3/uL (ref 150.0–400.0)
RBC: 4.93 Mil/uL (ref 3.87–5.11)
RDW: 13.9 % (ref 11.5–15.5)
WBC: 7.4 10*3/uL (ref 4.0–10.5)

## 2021-12-25 LAB — LDL CHOLESTEROL, DIRECT: Direct LDL: 115 mg/dL

## 2021-12-25 LAB — TSH: TSH: 3.01 u[IU]/mL (ref 0.35–5.50)

## 2021-12-26 LAB — HIV ANTIBODY (ROUTINE TESTING W REFLEX): HIV 1&2 Ab, 4th Generation: NONREACTIVE

## 2021-12-26 LAB — HEPATITIS C ANTIBODY: Hepatitis C Ab: NONREACTIVE

## 2022-01-10 ENCOUNTER — Other Ambulatory Visit: Payer: Self-pay

## 2022-02-06 ENCOUNTER — Other Ambulatory Visit: Payer: Self-pay

## 2022-02-07 NOTE — Telephone Encounter (Signed)
MyChart messgae sent to patient. 

## 2022-02-14 ENCOUNTER — Other Ambulatory Visit: Payer: Self-pay

## 2022-02-14 ENCOUNTER — Encounter: Payer: Self-pay | Admitting: Internal Medicine

## 2022-03-05 DIAGNOSIS — Z794 Long term (current) use of insulin: Secondary | ICD-10-CM | POA: Diagnosis not present

## 2022-03-05 DIAGNOSIS — E119 Type 2 diabetes mellitus without complications: Secondary | ICD-10-CM | POA: Diagnosis not present

## 2022-03-07 ENCOUNTER — Other Ambulatory Visit: Payer: Self-pay

## 2022-03-12 ENCOUNTER — Other Ambulatory Visit: Payer: Self-pay

## 2022-03-12 DIAGNOSIS — E782 Mixed hyperlipidemia: Secondary | ICD-10-CM | POA: Diagnosis not present

## 2022-03-12 DIAGNOSIS — E119 Type 2 diabetes mellitus without complications: Secondary | ICD-10-CM | POA: Diagnosis not present

## 2022-03-12 DIAGNOSIS — Z794 Long term (current) use of insulin: Secondary | ICD-10-CM | POA: Diagnosis not present

## 2022-03-12 DIAGNOSIS — E1142 Type 2 diabetes mellitus with diabetic polyneuropathy: Secondary | ICD-10-CM | POA: Diagnosis not present

## 2022-03-12 MED ORDER — SEMGLEE (YFGN) 100 UNIT/ML ~~LOC~~ SOPN
28.0000 [IU] | PEN_INJECTOR | Freq: Every day | SUBCUTANEOUS | 5 refills | Status: DC
  Filled 2022-03-12: qty 15, 53d supply, fill #0
  Filled 2022-05-06: qty 15, 53d supply, fill #1
  Filled 2022-07-01: qty 15, 53d supply, fill #2
  Filled 2022-09-04: qty 15, 53d supply, fill #3
  Filled 2022-10-30: qty 15, 53d supply, fill #4
  Filled 2022-12-30: qty 15, 53d supply, fill #5

## 2022-03-12 MED ORDER — INSULIN LISPRO (1 UNIT DIAL) 100 UNIT/ML (KWIKPEN): 10.0000 [IU] | PEN_INJECTOR | Freq: Every day | SUBCUTANEOUS | 4 refills | Status: DC

## 2022-03-12 MED ORDER — METFORMIN HCL ER 500 MG PO TB24
2000.0000 mg | ORAL_TABLET | Freq: Every day | ORAL | 3 refills | Status: DC
  Filled 2022-11-05: qty 360, 90d supply, fill #0
  Filled 2023-02-02: qty 360, 90d supply, fill #1

## 2022-03-12 MED ORDER — MOUNJARO 7.5 MG/0.5ML ~~LOC~~ SOAJ: 7.5000 mg | SUBCUTANEOUS | 3 refills | Status: DC

## 2022-03-19 ENCOUNTER — Other Ambulatory Visit: Payer: Self-pay

## 2022-03-21 DIAGNOSIS — E119 Type 2 diabetes mellitus without complications: Secondary | ICD-10-CM | POA: Diagnosis not present

## 2022-03-27 ENCOUNTER — Other Ambulatory Visit: Payer: Self-pay

## 2022-03-27 MED ORDER — FREESTYLE LIBRE 3 SENSOR MISC
3 refills | Status: DC
  Filled 2022-03-27: qty 6, 84d supply, fill #0
  Filled 2022-07-01: qty 6, 84d supply, fill #1
  Filled 2022-09-30: qty 6, 84d supply, fill #2
  Filled 2022-12-24: qty 6, 84d supply, fill #3

## 2022-04-03 ENCOUNTER — Other Ambulatory Visit: Payer: Self-pay

## 2022-05-28 ENCOUNTER — Other Ambulatory Visit: Payer: Self-pay

## 2022-05-28 DIAGNOSIS — L601 Onycholysis: Secondary | ICD-10-CM | POA: Diagnosis not present

## 2022-05-28 DIAGNOSIS — L603 Nail dystrophy: Secondary | ICD-10-CM | POA: Diagnosis not present

## 2022-05-28 DIAGNOSIS — E1142 Type 2 diabetes mellitus with diabetic polyneuropathy: Secondary | ICD-10-CM | POA: Diagnosis not present

## 2022-05-28 DIAGNOSIS — M79674 Pain in right toe(s): Secondary | ICD-10-CM | POA: Diagnosis not present

## 2022-05-30 ENCOUNTER — Ambulatory Visit: Payer: 59 | Admitting: Podiatry

## 2022-07-01 ENCOUNTER — Other Ambulatory Visit: Payer: Self-pay

## 2022-08-05 ENCOUNTER — Other Ambulatory Visit: Payer: Self-pay

## 2022-08-06 ENCOUNTER — Other Ambulatory Visit: Payer: Self-pay

## 2022-08-07 ENCOUNTER — Other Ambulatory Visit: Payer: Self-pay

## 2022-08-17 ENCOUNTER — Other Ambulatory Visit: Payer: Self-pay

## 2022-08-17 MED ORDER — FREESTYLE LITE TEST VI STRP
ORAL_STRIP | Freq: Three times a day (TID) | 3 refills | Status: AC
  Filled 2022-08-17: qty 300, 90d supply, fill #0

## 2022-08-22 ENCOUNTER — Other Ambulatory Visit: Payer: Self-pay | Admitting: Internal Medicine

## 2022-08-22 DIAGNOSIS — Z1231 Encounter for screening mammogram for malignant neoplasm of breast: Secondary | ICD-10-CM

## 2022-09-04 ENCOUNTER — Other Ambulatory Visit: Payer: Self-pay

## 2022-09-05 ENCOUNTER — Other Ambulatory Visit: Payer: Self-pay

## 2022-09-05 MED ORDER — MOUNJARO 7.5 MG/0.5ML ~~LOC~~ SOAJ
7.5000 mg | SUBCUTANEOUS | 1 refills | Status: DC
  Filled 2022-09-05: qty 2, 28d supply, fill #0
  Filled 2022-09-30: qty 2, 28d supply, fill #1
  Filled 2022-10-30: qty 2, 28d supply, fill #2
  Filled 2022-12-02: qty 2, 28d supply, fill #3
  Filled 2022-12-30: qty 2, 28d supply, fill #4
  Filled 2023-01-27: qty 2, 28d supply, fill #5

## 2022-09-06 DIAGNOSIS — E1142 Type 2 diabetes mellitus with diabetic polyneuropathy: Secondary | ICD-10-CM | POA: Diagnosis not present

## 2022-09-06 LAB — COMPREHENSIVE METABOLIC PANEL: eGFR: 101

## 2022-09-06 LAB — HEMOGLOBIN A1C: Hemoglobin A1C: 6.3

## 2022-09-06 LAB — MICROALBUMIN / CREATININE URINE RATIO: Microalb Creat Ratio: <27

## 2022-09-06 LAB — PROTEIN / CREATININE RATIO, URINE
Albumin, U: <7
Creatinine, Urine: 25.9

## 2022-09-07 ENCOUNTER — Ambulatory Visit (INDEPENDENT_AMBULATORY_CARE_PROVIDER_SITE_OTHER): Payer: 59 | Admitting: Nurse Practitioner

## 2022-09-07 VITALS — BP 118/76 | HR 80 | Temp 97.8°F | Ht 67.0 in | Wt 175.0 lb

## 2022-09-07 DIAGNOSIS — R0989 Other specified symptoms and signs involving the circulatory and respiratory systems: Secondary | ICD-10-CM | POA: Diagnosis not present

## 2022-09-07 DIAGNOSIS — J3489 Other specified disorders of nose and nasal sinuses: Secondary | ICD-10-CM

## 2022-09-07 NOTE — Progress Notes (Signed)
Established Patient Office Visit  Subjective:  Patient ID: Melissa Werner, female    DOB: Sep 22, 1965  Age: 57 y.o. MRN: [id number]  CC:  Chief Complaint  Patient presents with   running nose    Patient reports running nose only in left nostril since February     HPI  Melissa Werner presents for runny nose going on since February in the left nostril. She has no congestion, headache, pressure, ear popping.  Has not tried any OTC medication.   HPI   Past Medical History:  Diagnosis Date   Anemia    iron deficiency   Diabetes mellitus    GERD (gastroesophageal reflux disease)    History of abnormal mammogram 2008   negative 2016   Hyperlipidemia     Past Surgical History:  Procedure Laterality Date   COLONOSCOPY WITH PROPOFOL N/A 02/03/2016   Procedure: COLONOSCOPY WITH PROPOFOL;  Surgeon: Scot Jun, MD;  Location: [facility] ENDOSCOPY;  Service: Endoscopy;  Laterality: N/A;   ESOPHAGOGASTRODUODENOSCOPY (EGD) WITH PROPOFOL N/A 02/03/2016   Procedure: ESOPHAGOGASTRODUODENOSCOPY (EGD) WITH PROPOFOL;  Surgeon: Scot Jun, MD;  Location: [facility] ENDOSCOPY;  Service: Endoscopy;  Laterality: N/A;   TONSILLECTOMY     WISDOM TOOTH EXTRACTION      Family History  Problem Relation Age of Onset   Hyperlipidemia Father    Heart disease Father    Breast cancer Maternal Aunt 40    Social History   Socioeconomic History   Marital status: Married    Spouse name: Not on file   Number of children: Not on file   Years of education: Not on file   Highest education level: Not on file  Occupational History   Not on file  Tobacco Use   Smoking status: Never   Smokeless tobacco: Never  Vaping Use   Vaping status: Never Used  Substance and Sexual Activity   Alcohol use: No   Drug use: No   Sexual activity: Yes    Birth control/protection: None  Other Topics Concern   Not on file  Social History Narrative   Not on file   Social Determinants of Health   Financial  Resource Strain: Not on file  Food Insecurity: Not on file  Transportation Needs: Not on file  Physical Activity: Sufficiently Active (07/30/2018)   Exercise Vital Sign    Days of Exercise per Week: 3 days    Minutes of Exercise per Session: 60 min  Stress: No Stress Concern Present (07/30/2018)   [employer] of Occupational Health - Occupational Stress Questionnaire    Feeling of Stress : Not at all  Social Connections: Unknown (07/11/2021)   Received from [facility]   Social Network    Social Network: Not on file  Intimate Partner Violence: Unknown (06/02/2021)   Received from [facility]   HITS    Physically Hurt: Not on file    Insult or Talk Down To: Not on file    Threaten Physical Harm: Not on file    Scream or Curse: Not on file     Outpatient Medications Prior to Visit  Medication Sig Dispense Refill   Continuous Glucose Sensor (FREESTYLE LIBRE 3 SENSOR) MISC Use 1 sensor every 14 (fourteen) days. 6 each 3   glucose blood (FREESTYLE LITE) test strip Test 3 (three) times daily as instructed. 300 each 3   glucose blood test strip Use once daily to check cbg   DM type 2 100 each 1   insulin  lispro (HUMALOG KWIKPEN) 100 UNIT/ML KwikPen INJECT 10 UNITS BEFORE YOUR LUNCH MEAL. 15 mL 4   insulin lispro (HUMALOG) 100 UNIT/ML KwikPen INJECT 15 units at lunch and dinner 15 mL 11   insulin lispro (HUMALOG) 100 UNIT/ML KwikPen Inject 10 Units into the skin daily before lunch. 15 mL 4   metFORMIN (GLUCOPHAGE-XR) 500 MG 24 hr tablet TAKE 4 TABLETS (2,000 MG) BY MOUTH DAILY WITH DINNER 360 tablet 3   metFORMIN (GLUCOPHAGE-XR) 500 MG 24 hr tablet Take 4 tablets (2,000 mg total) by mouth daily with supper. 360 tablet 3   omeprazole (PRILOSEC) 20 MG capsule Take 20 mg by mouth daily.     ONE TOUCH ULTRA TEST test strip USE 3 TIMES DAILY TO CHECK BLOOD SUGAR. 100 each 2   SEMGLEE, YFGN, 100 UNIT/ML Pen Inject 32 Units subcutaneously once daily 15 mL 5   SEMGLEE, YFGN, 100 UNIT/ML Pen  Inject 28 Units into the skin daily. 15 mL 5   tirzepatide (MOUNJARO) 7.5 MG/0.5ML Pen Inject 7.5 mg into the skin once a week. 6 mL 3   tirzepatide (MOUNJARO) 7.5 MG/0.5ML Pen Inject 7.5 mg into the skin every 7 (seven) days. 6 mL 1   UNIFINE PENTIPS 31G X 5 MM MISC      ferrous sulfate 325 (65 FE) MG tablet Take 1 tablet (325 mg total) by mouth 2 (two) times daily with a meal. 60 tablet 0   No facility-administered medications prior to visit.    Allergies  Allergen Reactions   Tetanus Toxoids     high fevers, systemic illness     ROS Review of Systems Negative unless indicated in HPI.    Objective:    Physical Exam HENT:     Right Ear: Tympanic membrane normal. Tympanic membrane is not erythematous.     Left Ear: Tympanic membrane normal. Tympanic membrane is not erythematous.     Nose: Rhinorrhea present.     Right Turbinates: Not enlarged.     Left Turbinates: Not enlarged.     Right Sinus: No maxillary sinus tenderness or frontal sinus tenderness.     Left Sinus: No maxillary sinus tenderness or frontal sinus tenderness.     Mouth/Throat:     Mouth: Mucous membranes are moist.     Pharynx: No pharyngeal swelling, oropharyngeal exudate or posterior oropharyngeal erythema.     Tonsils: No tonsillar exudate.  Cardiovascular:     Rate and Rhythm: Normal rate and regular rhythm.  Pulmonary:     Effort: Pulmonary effort is normal.     Breath sounds: Normal breath sounds. No stridor. No wheezing.  Neurological:     General: No focal deficit present.     Mental Status: She is oriented to person, place, and time. Mental status is at baseline.  Psychiatric:        Mood and Affect: Mood normal.        Behavior: Behavior normal.        Thought Content: Thought content normal.        Judgment: Judgment normal.     BP 118/76 (BP Location: Left Arm, Patient Position: Sitting, Cuff Size: Normal)   Pulse 80   Temp 97.8 F (36.6 C) (Oral)   Ht 5\' 7"  (1.702 m)   Wt 175 lb  (79.4 kg)   LMP 09/09/2017   SpO2 99%   BMI 27.41 kg/m  Wt Readings from Last 3 Encounters:  09/07/22 175 lb (79.4 kg)  11/09/20 190 lb 12.8 oz (86.5  kg)  08/12/19 187 lb (84.8 kg)     Health Maintenance  Topic Date Due   DTaP/Tdap/Td (1 - Tdap) Never done   COVID-19 Vaccine (4 - 2023-24 season) 10/27/2021   HEMOGLOBIN A1C  03/14/2022   Diabetic kidney evaluation - Urine ACR  05/02/2022   OPHTHALMOLOGY EXAM  08/02/2022   INFLUENZA VACCINE  09/27/2022   MAMMOGRAM  10/06/2022   FOOT EXAM  11/14/2022   Diabetic kidney evaluation - eGFR measurement  12/26/2022   PAP SMEAR-Modifier  11/13/2024   Colonoscopy  02/02/2026   Hepatitis C Screening  Completed   HIV Screening  Completed   Zoster Vaccines- Shingrix  Completed   HPV VACCINES  Aged Out    There are no preventive care reminders to display for this patient.  Lab Results  Component Value Date   TSH 3.01 12/25/2021   Lab Results  Component Value Date   WBC 7.4 12/25/2021   HGB 13.5 12/25/2021   HCT 40.7 12/25/2021   MCV 82.6 12/25/2021   PLT 231.0 12/25/2021   Lab Results  Component Value Date   NA 141 12/25/2021   K 4.2 12/25/2021   CO2 26 12/25/2021   GLUCOSE 101 (H) 12/25/2021   BUN 13 12/25/2021   CREATININE 0.62 12/25/2021   BILITOT 0.5 12/25/2021   ALKPHOS 58 12/25/2021   AST 12 12/25/2021   ALT 12 12/25/2021   PROT 6.2 12/25/2021   ALBUMIN 4.2 12/25/2021   CALCIUM 8.9 12/25/2021   ANIONGAP 6 11/11/2015   GFR 99.99 12/25/2021   Lab Results  Component Value Date   CHOL 190 12/25/2021   Lab Results  Component Value Date   HDL 47.80 12/25/2021   Lab Results  Component Value Date   LDLCALC 107 05/12/2020   Lab Results  Component Value Date   TRIG 216.0 (H) 12/25/2021   Lab Results  Component Value Date   CHOLHDL 4 12/25/2021   Lab Results  Component Value Date   HGBA1C 6.8 09/11/2021      Assessment & Plan:  Rhinorrhea Assessment & Plan: Chronic rhinorrhea left  nostril. Would refer him to ENT for evaluation of nasal polyps.   Runny nose -     Ambulatory referral to ENT    Follow-up: No follow-ups on file.   Kara Dies, NP

## 2022-09-07 NOTE — Patient Instructions (Signed)
Referral sent to ENT. Try OTC antihistamine for symptomatic relief.

## 2022-09-20 ENCOUNTER — Encounter: Payer: Self-pay | Admitting: Nurse Practitioner

## 2022-09-20 DIAGNOSIS — R0989 Other specified symptoms and signs involving the circulatory and respiratory systems: Secondary | ICD-10-CM | POA: Insufficient documentation

## 2022-09-20 DIAGNOSIS — J3489 Other specified disorders of nose and nasal sinuses: Secondary | ICD-10-CM | POA: Insufficient documentation

## 2022-09-20 NOTE — Telephone Encounter (Signed)
Pt has not heard from referral to ENT that was placed on 09/07/2022.

## 2022-09-20 NOTE — Assessment & Plan Note (Signed)
Chronic rhinorrhea left nostril. Would refer him to ENT for evaluation of nasal polyps.

## 2022-09-21 NOTE — Telephone Encounter (Signed)
noted 

## 2022-09-24 DIAGNOSIS — H5203 Hypermetropia, bilateral: Secondary | ICD-10-CM | POA: Diagnosis not present

## 2022-09-24 DIAGNOSIS — E119 Type 2 diabetes mellitus without complications: Secondary | ICD-10-CM | POA: Diagnosis not present

## 2022-09-24 LAB — HM DIABETES EYE EXAM

## 2022-09-26 DIAGNOSIS — Z794 Long term (current) use of insulin: Secondary | ICD-10-CM | POA: Diagnosis not present

## 2022-09-26 DIAGNOSIS — E1142 Type 2 diabetes mellitus with diabetic polyneuropathy: Secondary | ICD-10-CM | POA: Diagnosis not present

## 2022-09-30 ENCOUNTER — Other Ambulatory Visit: Payer: Self-pay

## 2022-10-01 ENCOUNTER — Other Ambulatory Visit: Payer: Self-pay

## 2022-10-08 ENCOUNTER — Ambulatory Visit
Admission: RE | Admit: 2022-10-08 | Discharge: 2022-10-08 | Disposition: A | Discharge status: Home / Self Care | Payer: 59 | Source: Ambulatory Visit | Attending: Internal Medicine | Admitting: Internal Medicine

## 2022-10-08 DIAGNOSIS — Z1231 Encounter for screening mammogram for malignant neoplasm of breast: Secondary | ICD-10-CM | POA: Diagnosis not present

## 2022-10-11 ENCOUNTER — Encounter (INDEPENDENT_AMBULATORY_CARE_PROVIDER_SITE_OTHER): Payer: Self-pay

## 2022-10-18 ENCOUNTER — Other Ambulatory Visit: Payer: Self-pay

## 2022-10-18 DIAGNOSIS — J310 Chronic rhinitis: Secondary | ICD-10-CM | POA: Diagnosis not present

## 2022-10-18 MED ORDER — AZELASTINE HCL 0.1 % NA SOLN
NASAL | 12 refills | Status: DC
  Filled 2022-10-18: qty 30, 25d supply, fill #0
  Filled 2022-12-24: qty 30, 25d supply, fill #1

## 2022-10-30 ENCOUNTER — Other Ambulatory Visit: Payer: Self-pay

## 2022-10-31 ENCOUNTER — Other Ambulatory Visit: Payer: Self-pay

## 2022-11-05 ENCOUNTER — Other Ambulatory Visit: Payer: Self-pay

## 2022-11-15 ENCOUNTER — Encounter: Payer: No Typology Code available for payment source | Admitting: Internal Medicine

## 2022-11-19 ENCOUNTER — Encounter: Payer: No Typology Code available for payment source | Admitting: Internal Medicine

## 2022-11-29 DIAGNOSIS — J310 Chronic rhinitis: Secondary | ICD-10-CM | POA: Diagnosis not present

## 2022-11-29 DIAGNOSIS — J342 Deviated nasal septum: Secondary | ICD-10-CM | POA: Diagnosis not present

## 2022-12-25 ENCOUNTER — Other Ambulatory Visit: Payer: Self-pay

## 2022-12-26 ENCOUNTER — Ambulatory Visit (INDEPENDENT_AMBULATORY_CARE_PROVIDER_SITE_OTHER): Payer: 59 | Admitting: Internal Medicine

## 2022-12-26 ENCOUNTER — Other Ambulatory Visit: Payer: Self-pay

## 2022-12-26 ENCOUNTER — Encounter: Payer: Self-pay | Admitting: Internal Medicine

## 2022-12-26 VITALS — BP 130/86 | HR 84 | Ht 67.0 in | Wt 178.2 lb

## 2022-12-26 DIAGNOSIS — Z794 Long term (current) use of insulin: Secondary | ICD-10-CM

## 2022-12-26 DIAGNOSIS — Z0000 Encounter for general adult medical examination without abnormal findings: Secondary | ICD-10-CM | POA: Diagnosis not present

## 2022-12-26 DIAGNOSIS — R21 Rash and other nonspecific skin eruption: Secondary | ICD-10-CM | POA: Diagnosis not present

## 2022-12-26 DIAGNOSIS — E1129 Type 2 diabetes mellitus with other diabetic kidney complication: Secondary | ICD-10-CM | POA: Diagnosis not present

## 2022-12-26 DIAGNOSIS — E782 Mixed hyperlipidemia: Secondary | ICD-10-CM | POA: Diagnosis not present

## 2022-12-26 MED ORDER — IPRATROPIUM BROMIDE 0.03 % NA SOLN
2.0000 | Freq: Two times a day (BID) | NASAL | 0 refills | Status: DC
  Filled 2022-12-26: qty 30, 75d supply, fill #0

## 2022-12-26 NOTE — Patient Instructions (Addendum)
Good to see you!  YOUR BLOOD PRESSURE was slightly  HIGH TODAY  It should be 130/80 or less.  Please check your blood pressure a few times at home and send me the readings so I can determine if you need MEDICATION.

## 2022-12-26 NOTE — Progress Notes (Unsigned)
Patient ID: Melissa Werner, female    DOB: 05-14-1965  Age: 57 y.o. MRN: [id number]  The patient is here for annual preventive examination and management of other chronic and acute problems.   The risk factors are reflected in the social history.   The roster of all physicians providing medical care to patient - is listed in the Snapshot section of the chart.   Activities of daily living:  The patient is 100% independent in all ADLs: dressing, toileting, feeding as well as independent mobility   Home safety : The patient has smoke detectors in the home. They wear seatbelts.  There are no unsecured firearms at home. There is no violence in the home.    There is no risks for hepatitis, STDs or HIV. There is no   history of blood transfusion. They have no travel history to infectious disease endemic areas of the world.   The patient has seen their dentist in the last six month. They have seen their eye doctor in the last year. The patinet  denies slight hearing difficulty with regard to whispered voices and some television programs.  They have deferred audiologic testing in the last year.  They do not  have excessive sun exposure. Discussed the need for sun protection: hats, long sleeves and use of sunscreen if there is significant sun exposure.    Diet: the importance of a healthy diet is discussed. They do have a healthy diet.   The benefits of regular aerobic exercise were discussed. The patient  exercises  3 to 5 days per week  for  60 minutes.    Depression screen: there are no signs or vegative symptoms of depression- irritability, change in appetite, anhedonia, sadness/tearfullness.   The following portions of the patient's history were reviewed and updated as appropriate: allergies, current medications, past family history, past medical history,  past surgical history, past social history  and problem list.   Visual acuity was not assessed per patient preference since the patient has  regular follow up with an  ophthalmologist. Hearing and body mass index were assessed and reviewed.    During the course of the visit the patient was educated and counseled about appropriate screening and preventive services including : fall prevention , diabetes screening, nutrition counseling, colorectal cancer screening, and recommended immunizations.    Chief Complaint:   recurrent malar rash,  photosensitivity for the past 2 years,  and hair thinning .  No joint pain or fatigue.    Review of Symptoms  Patient denies headache, fevers, malaise, unintentional weight loss, skin rash, eye pain, sinus congestion and sinus pain, sore throat, dysphagia,  hemoptysis , cough, dyspnea, wheezing, chest pain, palpitations, orthopnea, edema, abdominal pain, nausea, melena, diarrhea, constipation, flank pain, dysuria, hematuria, urinary  Frequency, nocturia, numbness, tingling, seizures,  Focal weakness, Loss of consciousness,  Tremor, insomnia, depression, anxiety, and suicidal ideation.    Physical Exam:  BP 130/86   Pulse 84   Ht 5\' 7"  (1.702 m)   Wt 178 lb 3.2 oz (80.8 kg)   LMP 09/09/2017   SpO2 98%   BMI 27.91 kg/m    Physical Exam Vitals reviewed.  Constitutional:      General: She is not in acute distress.    Appearance: Normal appearance. She is well-developed and normal weight. She is not ill-appearing, toxic-appearing or diaphoretic.  HENT:     Head: Normocephalic.     Right Ear: Tympanic membrane, ear canal and external ear normal. There is  no impacted cerumen.     Left Ear: Tympanic membrane, ear canal and external ear normal. There is no impacted cerumen.     Nose: Nose normal.     Mouth/Throat:     Mouth: Mucous membranes are moist.     Pharynx: Oropharynx is clear.  Eyes:     General: No scleral icterus.       Right eye: No discharge.        Left eye: No discharge.     Conjunctiva/sclera: Conjunctivae normal.     Pupils: Pupils are equal, round, and reactive to light.   Neck:     Thyroid: No thyromegaly.     Vascular: No carotid bruit or JVD.  Cardiovascular:     Rate and Rhythm: Normal rate and regular rhythm.     Heart sounds: Normal heart sounds.  Pulmonary:     Effort: Pulmonary effort is normal. No respiratory distress.     Breath sounds: Normal breath sounds.  Chest:  Breasts:    Breasts are symmetrical.     Right: Normal. No swelling, inverted nipple, mass, nipple discharge, skin change or tenderness.     Left: Normal. No swelling, inverted nipple, mass, nipple discharge, skin change or tenderness.  Abdominal:     General: Bowel sounds are normal.     Palpations: Abdomen is soft. There is no mass.     Tenderness: There is no abdominal tenderness. There is no guarding or rebound.  Musculoskeletal:        General: Normal range of motion.     Cervical back: Normal range of motion and neck supple.  Lymphadenopathy:     Cervical: No cervical adenopathy.     Upper Body:     Right upper body: No supraclavicular, axillary or pectoral adenopathy.     Left upper body: No supraclavicular, axillary or pectoral adenopathy.  Skin:    General: Skin is warm and dry.  Neurological:     General: No focal deficit present.     Mental Status: She is alert and oriented to person, place, and time. Mental status is at baseline.  Psychiatric:        Mood and Affect: Mood normal.        Behavior: Behavior normal.        Thought Content: Thought content normal.        Judgment: Judgment normal.    Assessment and Plan: Mixed hyperlipidemia -     Comprehensive metabolic panel -     Lipid panel -     LDL cholesterol, direct  Malar rash -     ANA -     CBC with Differential/Platelet  Controlled type 2 diabetes mellitus with microalbuminuria, with long-term current use of insulin (HCC) Assessment & Plan: Managed by Dr Tedd Sias  .  Foot exam normal except for a small area of numbness over the 5th metatarsal  urine mciroalbumin is normal by July labs from  [facility] Results  Component Value Date   MICROALBUR 22.1 11/05/2019   MICROALBUR 22.6 11/12/2018        Visit for preventive health examination Assessment & Plan: age appropriate education and counseling updated, referrals for preventative services and immunizations addressed, dietary and smoking counseling addressed, most recent labs reviewed.  I have personally reviewed and have noted:   1) the patient's medical and social history 2) The pt's use of alcohol, tobacco, and illicit drugs 3) The patient's current medications and supplements 4) Functional ability  including ADL's, fall risk, home safety risk, hearing and visual impairment 5) Diet and physical activities 6) Evidence for depression or mood disorder 7) The patient's height, weight, and BMI have been recorded in the chart   I have made referrals, and provided counseling and education based on review of the above    Other orders -     Ipratropium Bromide; Place 2 sprays into both nostrils every 12 (twelve) hours.  Dispense: 30 mL; Refill: 0    No follow-ups on file.  Sherlene Shams, MD

## 2022-12-27 ENCOUNTER — Other Ambulatory Visit: Payer: Self-pay

## 2022-12-27 LAB — CBC WITH DIFFERENTIAL/PLATELET
Basophils Absolute: 0.1 10*3/uL (ref 0.0–0.1)
Basophils Relative: 0.9 % (ref 0.0–3.0)
Eosinophils Absolute: 0.2 10*3/uL (ref 0.0–0.7)
Eosinophils Relative: 2.5 % (ref 0.0–5.0)
HCT: 42.8 % (ref 36.0–46.0)
Hemoglobin: 13.5 g/dL (ref 12.0–15.0)
Lymphocytes Relative: 41.9 % (ref 12.0–46.0)
Lymphs Abs: 3.9 10*3/uL (ref 0.7–4.0)
MCHC: 31.6 g/dL (ref 30.0–36.0)
MCV: 84 fL (ref 78.0–100.0)
Monocytes Absolute: 0.7 10*3/uL (ref 0.1–1.0)
Monocytes Relative: 7.1 % (ref 3.0–12.0)
Neutro Abs: 4.4 10*3/uL (ref 1.4–7.7)
Neutrophils Relative %: 47.6 % (ref 43.0–77.0)
Platelets: 289 10*3/uL (ref 150.0–400.0)
RBC: 5.09 Mil/uL (ref 3.87–5.11)
RDW: 14.6 % (ref 11.5–15.5)
WBC: 9.3 10*3/uL (ref 4.0–10.5)

## 2022-12-27 LAB — COMPREHENSIVE METABOLIC PANEL
ALT: 15 U/L (ref 0–35)
AST: 13 U/L (ref 0–37)
Albumin: 4.6 g/dL (ref 3.5–5.2)
Alkaline Phosphatase: 61 U/L (ref 39–117)
BUN: 16 mg/dL (ref 6–23)
CO2: 27 meq/L (ref 19–32)
Calcium: 9.6 mg/dL (ref 8.4–10.5)
Chloride: 100 meq/L (ref 96–112)
Creatinine, Ser: 0.72 mg/dL (ref 0.40–1.20)
GFR: 93.22 mL/min (ref >60.00)
Glucose, Bld: 180 mg/dL — ABNORMAL HIGH (ref 70–99)
Potassium: 4.3 meq/L (ref 3.5–5.1)
Sodium: 138 meq/L (ref 135–145)
Total Bilirubin: 0.3 mg/dL (ref 0.2–1.2)
Total Protein: 7.5 g/dL (ref 6.0–8.3)

## 2022-12-27 LAB — LIPID PANEL
Cholesterol: 223 mg/dL — ABNORMAL HIGH (ref 0–200)
HDL: 59.1 mg/dL (ref >39.00)
LDL Cholesterol: 119 mg/dL — ABNORMAL HIGH (ref 0–99)
NonHDL: 164.13
Total CHOL/HDL Ratio: 4
Triglycerides: 227 mg/dL — ABNORMAL HIGH (ref 0.0–149.0)
VLDL: 45.4 mg/dL — ABNORMAL HIGH (ref 0.0–40.0)

## 2022-12-27 LAB — LDL CHOLESTEROL, DIRECT: Direct LDL: 135 mg/dL

## 2022-12-27 NOTE — Assessment & Plan Note (Signed)

## 2022-12-27 NOTE — Assessment & Plan Note (Addendum)
Managed by Dr Tedd Sias  .  Foot exam normal except for a small area of numbness over the 5th metatarsal  urine mciroalbumin is normal by July labs from [facility] Results  Component Value Date   MICROALBUR 22.1 11/05/2019   MICROALBUR 22.6 11/12/2018

## 2022-12-29 LAB — ANA: Anti Nuclear Antibody (ANA): NEGATIVE

## 2022-12-30 ENCOUNTER — Other Ambulatory Visit: Payer: Self-pay

## 2022-12-31 ENCOUNTER — Other Ambulatory Visit: Payer: Self-pay

## 2022-12-31 MED ORDER — INSULIN LISPRO (1 UNIT DIAL) 100 UNIT/ML (KWIKPEN)
10.0000 [IU] | PEN_INJECTOR | Freq: Every day | SUBCUTANEOUS | 5 refills | Status: AC
  Filled 2022-12-31: qty 15, 90d supply, fill #0
  Filled 2023-01-20 – 2023-01-22 (×2): qty 15, 150d supply, fill #0
  Filled 2023-01-28: qty 15, 90d supply, fill #0
  Filled 2023-04-28: qty 15, 90d supply, fill #1
  Filled 2023-07-07 – 2023-07-16 (×2): qty 15, 90d supply, fill #2
  Filled 2023-10-03 – 2023-10-04 (×2): qty 15, 90d supply, fill #3
  Filled 2023-12-22 – 2023-12-28 (×3): qty 15, 90d supply, fill #4

## 2023-01-09 ENCOUNTER — Other Ambulatory Visit: Payer: Self-pay

## 2023-01-09 ENCOUNTER — Encounter: Payer: Self-pay | Admitting: Internal Medicine

## 2023-01-09 DIAGNOSIS — E782 Mixed hyperlipidemia: Secondary | ICD-10-CM

## 2023-01-09 MED ORDER — ROSUVASTATIN CALCIUM 5 MG PO TABS
5.0000 mg | ORAL_TABLET | Freq: Every day | ORAL | 0 refills | Status: DC
  Filled 2023-01-09: qty 90, 90d supply, fill #0

## 2023-01-09 NOTE — Addendum Note (Signed)
Addended by: Sherlene Shams on: 01/09/2023 01:01 PM   Modules accepted: Orders

## 2023-01-09 NOTE — Assessment & Plan Note (Signed)
She is willing to initiate statin therpay.  Generic Crestor 5 mg dprescribed.  Lfts needed after 3 weeks

## 2023-01-21 ENCOUNTER — Other Ambulatory Visit: Payer: Self-pay

## 2023-01-22 ENCOUNTER — Other Ambulatory Visit: Payer: Self-pay

## 2023-01-28 ENCOUNTER — Other Ambulatory Visit: Payer: Self-pay

## 2023-02-04 ENCOUNTER — Other Ambulatory Visit (INDEPENDENT_AMBULATORY_CARE_PROVIDER_SITE_OTHER): Payer: 59

## 2023-02-04 DIAGNOSIS — E782 Mixed hyperlipidemia: Secondary | ICD-10-CM

## 2023-02-05 LAB — COMPREHENSIVE METABOLIC PANEL
ALT: 11 U/L (ref 0–35)
AST: 12 U/L (ref 0–37)
Albumin: 4.4 g/dL (ref 3.5–5.2)
Alkaline Phosphatase: 55 U/L (ref 39–117)
BUN: 13 mg/dL (ref 6–23)
CO2: 29 meq/L (ref 19–32)
Calcium: 9.6 mg/dL (ref 8.4–10.5)
Chloride: 101 meq/L (ref 96–112)
Creatinine, Ser: 0.72 mg/dL (ref 0.40–1.20)
GFR: 93.15 mL/min (ref >60.00)
Glucose, Bld: 131 mg/dL — ABNORMAL HIGH (ref 70–99)
Potassium: 4.1 meq/L (ref 3.5–5.1)
Sodium: 138 meq/L (ref 135–145)
Total Bilirubin: 0.3 mg/dL (ref 0.2–1.2)
Total Protein: 6.8 g/dL (ref 6.0–8.3)

## 2023-03-02 ENCOUNTER — Other Ambulatory Visit: Payer: Self-pay

## 2023-03-03 ENCOUNTER — Other Ambulatory Visit: Payer: Self-pay

## 2023-03-05 ENCOUNTER — Other Ambulatory Visit: Payer: Self-pay

## 2023-03-05 MED ORDER — MOUNJARO 7.5 MG/0.5ML ~~LOC~~ SOAJ
7.5000 mg | SUBCUTANEOUS | 1 refills | Status: DC
  Filled 2023-03-05: qty 2, 28d supply, fill #0
  Filled 2023-04-07: qty 2, 28d supply, fill #1
  Filled 2023-04-29: qty 2, 28d supply, fill #2
  Filled 2023-05-29: qty 2, 28d supply, fill #3
  Filled 2023-06-30 – 2023-07-01 (×2): qty 2, 28d supply, fill #4
  Filled 2023-07-31: qty 2, 28d supply, fill #5

## 2023-03-05 MED ORDER — SEMGLEE (YFGN) 100 UNIT/ML ~~LOC~~ SOPN
28.0000 [IU] | PEN_INJECTOR | Freq: Every day | SUBCUTANEOUS | 5 refills | Status: DC
  Filled 2023-03-05: qty 15, 53d supply, fill #0
  Filled 2023-04-28 – 2023-04-29 (×2): qty 15, 53d supply, fill #1
  Filled 2023-06-30 – 2023-07-01 (×2): qty 15, 53d supply, fill #2
  Filled 2023-08-28: qty 15, 53d supply, fill #3
  Filled 2023-10-30: qty 15, 53d supply, fill #4
  Filled 2023-12-22: qty 15, 53d supply, fill #5

## 2023-03-24 ENCOUNTER — Other Ambulatory Visit: Payer: Self-pay

## 2023-03-26 ENCOUNTER — Other Ambulatory Visit: Payer: Self-pay

## 2023-03-26 MED ORDER — FREESTYLE LIBRE 3 SENSOR MISC
1.0000 | 1 refills | Status: DC
  Filled 2023-03-26: qty 6, 84d supply, fill #0

## 2023-03-28 DIAGNOSIS — E1142 Type 2 diabetes mellitus with diabetic polyneuropathy: Secondary | ICD-10-CM | POA: Diagnosis not present

## 2023-04-04 ENCOUNTER — Other Ambulatory Visit: Payer: Self-pay

## 2023-04-04 DIAGNOSIS — E782 Mixed hyperlipidemia: Secondary | ICD-10-CM | POA: Diagnosis not present

## 2023-04-04 DIAGNOSIS — Z794 Long term (current) use of insulin: Secondary | ICD-10-CM | POA: Diagnosis not present

## 2023-04-04 DIAGNOSIS — E1142 Type 2 diabetes mellitus with diabetic polyneuropathy: Secondary | ICD-10-CM | POA: Diagnosis not present

## 2023-04-04 MED ORDER — FREESTYLE LIBRE 3 PLUS SENSOR MISC
3 refills | Status: AC
  Filled 2023-04-04 – 2023-06-20 (×2): qty 6, 90d supply, fill #0
  Filled 2023-09-23: qty 6, 90d supply, fill #1
  Filled 2023-12-17: qty 6, 90d supply, fill #2
  Filled 2024-03-13: qty 6, 90d supply, fill #3

## 2023-04-04 MED ORDER — METFORMIN HCL ER 500 MG PO TB24
2000.0000 mg | ORAL_TABLET | Freq: Every day | ORAL | 3 refills | Status: AC
  Filled 2023-04-04 – 2023-04-28 (×2): qty 360, 90d supply, fill #0
  Filled 2023-07-31: qty 360, 90d supply, fill #1
  Filled 2023-10-30: qty 360, 90d supply, fill #2
  Filled 2024-01-28: qty 360, 90d supply, fill #3

## 2023-04-07 ENCOUNTER — Other Ambulatory Visit: Payer: Self-pay | Admitting: Internal Medicine

## 2023-04-08 ENCOUNTER — Other Ambulatory Visit: Payer: Self-pay | Admitting: Internal Medicine

## 2023-04-08 ENCOUNTER — Other Ambulatory Visit: Payer: Self-pay

## 2023-04-08 MED FILL — Rosuvastatin Calcium Tab 5 MG: ORAL | 90 days supply | Qty: 90 | Fill #0 | Status: AC

## 2023-04-09 ENCOUNTER — Other Ambulatory Visit: Payer: Self-pay

## 2023-04-28 ENCOUNTER — Other Ambulatory Visit: Payer: Self-pay

## 2023-04-29 ENCOUNTER — Other Ambulatory Visit (HOSPITAL_COMMUNITY): Payer: Self-pay

## 2023-04-29 ENCOUNTER — Other Ambulatory Visit: Payer: Self-pay

## 2023-04-30 ENCOUNTER — Other Ambulatory Visit: Payer: Self-pay

## 2023-06-07 ENCOUNTER — Other Ambulatory Visit (HOSPITAL_COMMUNITY): Payer: Self-pay

## 2023-06-18 ENCOUNTER — Other Ambulatory Visit: Payer: Self-pay

## 2023-06-20 ENCOUNTER — Other Ambulatory Visit: Payer: Self-pay

## 2023-06-21 ENCOUNTER — Other Ambulatory Visit: Payer: Self-pay

## 2023-06-30 ENCOUNTER — Other Ambulatory Visit: Payer: Self-pay

## 2023-07-01 ENCOUNTER — Other Ambulatory Visit (HOSPITAL_COMMUNITY): Payer: Self-pay

## 2023-07-01 ENCOUNTER — Other Ambulatory Visit: Payer: Self-pay

## 2023-07-07 ENCOUNTER — Other Ambulatory Visit: Payer: Self-pay

## 2023-07-07 ENCOUNTER — Other Ambulatory Visit: Payer: Self-pay | Admitting: Internal Medicine

## 2023-07-08 ENCOUNTER — Other Ambulatory Visit: Payer: Self-pay | Admitting: Internal Medicine

## 2023-07-08 ENCOUNTER — Other Ambulatory Visit: Payer: Self-pay

## 2023-07-08 MED FILL — Rosuvastatin Calcium Tab 5 MG: ORAL | 90 days supply | Qty: 90 | Fill #0 | Status: AC

## 2023-07-09 ENCOUNTER — Other Ambulatory Visit: Payer: Self-pay

## 2023-07-16 ENCOUNTER — Other Ambulatory Visit: Payer: Self-pay

## 2023-08-28 ENCOUNTER — Other Ambulatory Visit: Payer: Self-pay | Admitting: Internal Medicine

## 2023-08-28 ENCOUNTER — Other Ambulatory Visit: Payer: Self-pay

## 2023-08-28 DIAGNOSIS — Z1231 Encounter for screening mammogram for malignant neoplasm of breast: Secondary | ICD-10-CM

## 2023-08-29 ENCOUNTER — Other Ambulatory Visit: Payer: Self-pay

## 2023-08-29 MED ORDER — MOUNJARO 7.5 MG/0.5ML ~~LOC~~ SOAJ
7.5000 mg | SUBCUTANEOUS | 1 refills | Status: AC
  Filled 2023-08-29: qty 6, 84d supply, fill #0
  Filled 2023-12-01: qty 6, 84d supply, fill #1

## 2023-09-10 ENCOUNTER — Other Ambulatory Visit: Payer: Self-pay

## 2023-09-23 ENCOUNTER — Other Ambulatory Visit: Payer: Self-pay

## 2023-10-03 ENCOUNTER — Other Ambulatory Visit: Payer: Self-pay

## 2023-10-03 MED FILL — Rosuvastatin Calcium Tab 5 MG: ORAL | 90 days supply | Qty: 90 | Fill #1 | Status: AC

## 2023-10-08 DIAGNOSIS — E1142 Type 2 diabetes mellitus with diabetic polyneuropathy: Secondary | ICD-10-CM | POA: Diagnosis not present

## 2023-10-08 LAB — BASIC METABOLIC PANEL WITH GFR
BUN: 16 (ref 4–21)
CO2: 28 — AB (ref 13–22)
Chloride: 105 (ref 99–108)
Creatinine: 0.8 (ref 0.5–1.1)
Glucose: 126
Potassium: 4.6 meq/L (ref 3.5–5.1)
Sodium: 139 (ref 137–147)

## 2023-10-08 LAB — PROTEIN / CREATININE RATIO, URINE
Albumin, U: <7
Creatinine, Urine: 106.9

## 2023-10-08 LAB — LIPID PANEL
Cholesterol: 135 (ref 0–200)
HDL: 48 (ref 35–70)
LDL Cholesterol: 52
Triglycerides: 180 — AB (ref 40–160)

## 2023-10-08 LAB — COMPREHENSIVE METABOLIC PANEL WITH GFR: eGFR: 86

## 2023-10-08 LAB — MICROALBUMIN / CREATININE URINE RATIO: Microalb Creat Ratio: <6.5

## 2023-10-08 LAB — HEMOGLOBIN A1C: Hemoglobin A1C: 6.6

## 2023-10-14 ENCOUNTER — Ambulatory Visit
Admission: RE | Admit: 2023-10-14 | Discharge: 2023-10-14 | Disposition: A | Discharge status: Home / Self Care | Source: Ambulatory Visit | Attending: Internal Medicine | Admitting: Internal Medicine

## 2023-10-14 DIAGNOSIS — Z1231 Encounter for screening mammogram for malignant neoplasm of breast: Secondary | ICD-10-CM | POA: Diagnosis not present

## 2023-10-15 DIAGNOSIS — Z1331 Encounter for screening for depression: Secondary | ICD-10-CM | POA: Diagnosis not present

## 2023-10-15 DIAGNOSIS — Z794 Long term (current) use of insulin: Secondary | ICD-10-CM | POA: Diagnosis not present

## 2023-10-15 DIAGNOSIS — E782 Mixed hyperlipidemia: Secondary | ICD-10-CM | POA: Diagnosis not present

## 2023-10-15 DIAGNOSIS — E1142 Type 2 diabetes mellitus with diabetic polyneuropathy: Secondary | ICD-10-CM | POA: Diagnosis not present

## 2023-10-31 ENCOUNTER — Other Ambulatory Visit: Payer: Self-pay

## 2023-12-02 DIAGNOSIS — E119 Type 2 diabetes mellitus without complications: Secondary | ICD-10-CM | POA: Diagnosis not present

## 2023-12-02 DIAGNOSIS — H5203 Hypermetropia, bilateral: Secondary | ICD-10-CM | POA: Diagnosis not present

## 2023-12-02 LAB — OPHTHALMOLOGY REPORT-SCANNED

## 2023-12-17 ENCOUNTER — Other Ambulatory Visit: Payer: Self-pay

## 2023-12-23 ENCOUNTER — Other Ambulatory Visit: Payer: Self-pay

## 2023-12-24 ENCOUNTER — Other Ambulatory Visit: Payer: Self-pay

## 2023-12-25 ENCOUNTER — Other Ambulatory Visit: Payer: Self-pay

## 2023-12-26 ENCOUNTER — Other Ambulatory Visit: Payer: Self-pay

## 2023-12-27 ENCOUNTER — Other Ambulatory Visit: Payer: Self-pay

## 2023-12-27 MED ORDER — BASAGLAR KWIKPEN 100 UNIT/ML ~~LOC~~ SOPN
28.0000 [IU] | PEN_INJECTOR | Freq: Every day | SUBCUTANEOUS | 3 refills | Status: AC
  Filled 2023-12-27: qty 15, 53d supply, fill #0
  Filled 2024-02-22: qty 15, 53d supply, fill #1

## 2023-12-27 MED ORDER — SEMGLEE (YFGN) 100 UNIT/ML ~~LOC~~ SOPN: PEN_INJECTOR | SUBCUTANEOUS | 5 refills | Status: DC

## 2023-12-28 ENCOUNTER — Other Ambulatory Visit: Payer: Self-pay

## 2023-12-29 ENCOUNTER — Other Ambulatory Visit: Payer: Self-pay | Admitting: Internal Medicine

## 2023-12-30 ENCOUNTER — Other Ambulatory Visit: Payer: Self-pay

## 2023-12-30 ENCOUNTER — Encounter: Payer: Self-pay | Admitting: Internal Medicine

## 2023-12-30 ENCOUNTER — Ambulatory Visit: Payer: 59 | Admitting: Internal Medicine

## 2023-12-30 VITALS — BP 134/78 | HR 91 | Ht 67.0 in | Wt 170.8 lb

## 2023-12-30 DIAGNOSIS — E538 Deficiency of other specified B group vitamins: Secondary | ICD-10-CM | POA: Diagnosis not present

## 2023-12-30 DIAGNOSIS — Z0000 Encounter for general adult medical examination without abnormal findings: Secondary | ICD-10-CM | POA: Diagnosis not present

## 2023-12-30 MED ORDER — ROSUVASTATIN CALCIUM 5 MG PO TABS
5.0000 mg | ORAL_TABLET | Freq: Every day | ORAL | 1 refills | Status: AC
  Filled 2023-12-30: qty 90, 90d supply, fill #0
  Filled 2024-03-29: qty 90, 90d supply, fill #1

## 2023-12-30 NOTE — Patient Instructions (Signed)
 I am checking your B12 and IF Ab level today since your B12 was low in 2022  If the IF ab is POSITIVE AND B12 IS LOW , I WILL RECOMMEND INJECTABLE B12 INSTEAD OF ORAL   I HAVE REFILLED YOUR CRESTOR  FOR 6 MONTHS.  YOU CAN REQUEST A REFILL WITHOUT A VISIT IF YOU HAVE HAD LIVER TESTS BY DR SOLUM IN 6 MONTHS

## 2023-12-30 NOTE — Assessment & Plan Note (Signed)

## 2023-12-30 NOTE — Progress Notes (Unsigned)
 Patient ID: Melissa Werner, female    DOB: 04/22/1965  Age: 58 y.o. MRN: [id number]  The patient is here for annual preventive examination and management of other chronic and acute problems.   The risk factors are reflected in the social history.   The roster of all physicians providing medical care to patient - is listed in the Snapshot section of the chart.   Activities of daily living:  The patient is 100% independent in all ADLs: dressing, toileting, feeding as well as independent mobility   Home safety : The patient has smoke detectors in the home. They wear seatbelts.  There are no unsecured firearms at home. There is no violence in the home.    There is no risks for hepatitis, STDs or HIV. There is no   history of blood transfusion. They have no travel history to infectious disease endemic areas of the world.   The patient has seen their dentist in the last six month. They have seen their eye doctor in the last year. The patinet  denies slight hearing difficulty with regard to whispered voices and some television programs.  They have deferred audiologic testing in the last year.  They do not  have excessive sun exposure. Discussed the need for sun protection: hats, long sleeves and use of sunscreen if there is significant sun exposure.    Diet: the importance of a healthy diet is discussed. They do have a healthy diet.   The benefits of regular aerobic exercise were discussed. The patient  exercises  3 to 5 days per week  for  60 minutes.    Depression screen: there are no signs or vegative symptoms of depression- irritability, change in appetite, anhedonia, sadness/tearfullness.   The following portions of the patient's history were reviewed and updated as appropriate: allergies, current medications, past family history, past medical history,  past surgical history, past social history  and problem list.   Visual acuity was not assessed per patient preference since the patient has  regular follow up with an  ophthalmologist. Hearing and body mass index were assessed and reviewed.    During the course of the visit the patient was educated and counseled about appropriate screening and preventive services including : fall prevention , diabetes screening, nutrition counseling, colorectal cancer screening, and recommended immunizations.    Chief Complaint:  HPI     Annual Exam    Additional comments: Physical       Last edited by Melissa Werner, CMA on 12/30/2023  3:32 PM.       Review of Symptoms  Patient denies headache, fevers, malaise, unintentional weight loss, skin rash, eye pain, sinus congestion and sinus pain, sore throat, dysphagia,  hemoptysis , cough, dyspnea, wheezing, chest pain, palpitations, orthopnea, edema, abdominal pain, nausea, melena, diarrhea, constipation, flank pain, dysuria, hematuria, urinary  Frequency, nocturia, numbness, tingling, seizures,  Focal weakness, Loss of consciousness,  Tremor, insomnia, depression, anxiety, and suicidal ideation.    Physical Exam:  BP 134/78   Pulse 91   Ht 5' 7 (1.702 m)   Wt 170 lb 12.8 oz (77.5 kg)   LMP 09/09/2017   SpO2 96%   BMI 26.75 kg/m    Physical Exam Vitals reviewed.  Constitutional:      General: She is not in acute distress.    Appearance: Normal appearance. She is normal weight. She is not ill-appearing, toxic-appearing or diaphoretic.  HENT:     Head: Normocephalic.  Eyes:     General:  No scleral icterus.       Right eye: No discharge.        Left eye: No discharge.     Conjunctiva/sclera: Conjunctivae normal.  Cardiovascular:     Rate and Rhythm: Normal rate and regular rhythm.     Heart sounds: Normal heart sounds.  Pulmonary:     Effort: Pulmonary effort is normal. No respiratory distress.     Breath sounds: Normal breath sounds.  Musculoskeletal:        General: Normal range of motion.  Skin:    General: Skin is warm and dry.  Neurological:     General: No focal  deficit present.     Mental Status: She is alert and oriented to person, place, and time. Mental status is at baseline.  Psychiatric:        Mood and Affect: Mood normal.        Behavior: Behavior normal.        Thought Content: Thought content normal.        Judgment: Judgment normal.     Assessment and Plan: Visit for preventive health examination Assessment & Plan: age appropriate education and counseling updated, referrals for preventative services and immunizations addressed, dietary and smoking counseling addressed, most recent labs reviewed.  I have personally reviewed and have noted:   1) the patient's medical and social history 2) The pt's use of alcohol, tobacco, and illicit drugs 3) The patient's current medications and supplements 4) Functional ability including ADL's, fall risk, home safety risk, hearing and visual impairment 5) Diet and physical activities 6) Evidence for depression or mood disorder 7) The patient's height, weight, and BMI have been recorded in the chart.     I have made referrals, and provided counseling and education based on review of the above    B12 deficiency -     Intrinsic Factor Antibodies -     B12 and Folate Panel    No follow-ups on file.  Verneita LITTIE Kettering, MD

## 2023-12-31 LAB — B12 AND FOLATE PANEL
Folate: 8.6 ng/mL (ref >3.0)
Vitamin B-12: 414 pg/mL (ref 232–1245)

## 2023-12-31 LAB — INTRINSIC FACTOR ANTIBODIES: Intrinsic Factor Abs, Serum: 1 [AU]/ml (ref 0.0–1.1)

## 2024-01-02 ENCOUNTER — Ambulatory Visit: Payer: Self-pay | Admitting: Internal Medicine

## 2024-01-15 ENCOUNTER — Other Ambulatory Visit: Payer: Self-pay

## 2024-02-22 ENCOUNTER — Other Ambulatory Visit: Payer: Self-pay

## 2024-03-01 ENCOUNTER — Other Ambulatory Visit: Payer: Self-pay

## 2024-03-03 ENCOUNTER — Other Ambulatory Visit (HOSPITAL_COMMUNITY): Payer: Self-pay

## 2024-03-03 ENCOUNTER — Other Ambulatory Visit: Payer: Self-pay

## 2024-03-03 ENCOUNTER — Other Ambulatory Visit (HOSPITAL_BASED_OUTPATIENT_CLINIC_OR_DEPARTMENT_OTHER): Payer: Self-pay

## 2024-03-03 MED ORDER — MOUNJARO 7.5 MG/0.5ML ~~LOC~~ SOAJ
SUBCUTANEOUS | 1 refills | Status: AC
  Filled 2024-03-03: qty 6, 84d supply, fill #0

## 2024-03-13 ENCOUNTER — Other Ambulatory Visit: Payer: Self-pay

## 2024-03-13 ENCOUNTER — Other Ambulatory Visit (HOSPITAL_COMMUNITY): Payer: Self-pay

## 2024-03-30 ENCOUNTER — Other Ambulatory Visit (HOSPITAL_COMMUNITY): Payer: Self-pay

## 2025-01-04 ENCOUNTER — Encounter: Admitting: Internal Medicine
